# Patient Record
Sex: Female | Born: 1935 | Race: White | Hispanic: No | Smoking: Never smoker
Health system: Southern US, Community
[De-identification: ages and names within clinical notes are randomized; demographics above are authoritative.]

## PROBLEM LIST (undated history)

## (undated) DIAGNOSIS — Z8781 Personal history of (healed) traumatic fracture: Secondary | ICD-10-CM

## (undated) DIAGNOSIS — F329 Major depressive disorder, single episode, unspecified: Secondary | ICD-10-CM

## (undated) DIAGNOSIS — S7292XA Unspecified fracture of left femur, initial encounter for closed fracture: Secondary | ICD-10-CM

## (undated) DIAGNOSIS — G20C Parkinsonism, unspecified: Secondary | ICD-10-CM

## (undated) DIAGNOSIS — F419 Anxiety disorder, unspecified: Secondary | ICD-10-CM

## (undated) DIAGNOSIS — H3530 Unspecified macular degeneration: Secondary | ICD-10-CM

## (undated) DIAGNOSIS — Q600 Renal agenesis, unilateral: Secondary | ICD-10-CM

## (undated) DIAGNOSIS — IMO0002 Reserved for concepts with insufficient information to code with codable children: Secondary | ICD-10-CM

## (undated) DIAGNOSIS — R413 Other amnesia: Secondary | ICD-10-CM

## (undated) DIAGNOSIS — I10 Essential (primary) hypertension: Secondary | ICD-10-CM

## (undated) DIAGNOSIS — G4700 Insomnia, unspecified: Secondary | ICD-10-CM

## (undated) DIAGNOSIS — F32A Depression, unspecified: Secondary | ICD-10-CM

## (undated) DIAGNOSIS — F0390 Unspecified dementia without behavioral disturbance: Secondary | ICD-10-CM

## (undated) HISTORY — PX: RECTOCELE REPAIR: SHX761

## (undated) HISTORY — PX: CATARACT EXTRACTION, BILATERAL: SHX1313

## (undated) HISTORY — DX: Other amnesia: R41.3

## (undated) HISTORY — PX: ABDOMINAL HYSTERECTOMY: SHX81

## (undated) HISTORY — DX: Unspecified fracture of left femur, initial encounter for closed fracture: S72.92XA

## (undated) HISTORY — PX: BREAST SURGERY: SHX581

## (undated) HISTORY — PX: BLADDER SUSPENSION: SHX72

## (undated) HISTORY — PX: NASAL SEPTUM SURGERY: SHX37

## (undated) HISTORY — PX: EYE SURGERY: SHX253

## (undated) HISTORY — PX: GALLBLADDER SURGERY: SHX652

## (undated) HISTORY — PX: RECONSTRUCTION OF EYELID: SHX6576

## (undated) HISTORY — PX: JOINT REPLACEMENT: SHX530

---

## 2016-07-17 ENCOUNTER — Encounter (HOSPITAL_COMMUNITY)
Admission: EM | Disposition: A | Discharge status: Skilled Nursing Facility | Payer: Self-pay | Source: Home / Self Care | Attending: Internal Medicine

## 2016-07-17 ENCOUNTER — Emergency Department (HOSPITAL_COMMUNITY): Payer: Medicare Other

## 2016-07-17 ENCOUNTER — Encounter (HOSPITAL_COMMUNITY): Payer: Self-pay | Admitting: Emergency Medicine

## 2016-07-17 ENCOUNTER — Inpatient Hospital Stay (HOSPITAL_COMMUNITY)
Admission: EM | Admit: 2016-07-17 | Discharge: 2016-07-21 | DRG: 470 | Disposition: A | Discharge status: Skilled Nursing Facility | Payer: Medicare Other | Attending: Internal Medicine | Admitting: Internal Medicine

## 2016-07-17 ENCOUNTER — Observation Stay (HOSPITAL_COMMUNITY): Payer: Medicare Other | Admitting: Anesthesiology

## 2016-07-17 ENCOUNTER — Inpatient Hospital Stay (HOSPITAL_COMMUNITY): Payer: Medicare Other

## 2016-07-17 ENCOUNTER — Observation Stay (HOSPITAL_COMMUNITY): Payer: Medicare Other

## 2016-07-17 DIAGNOSIS — Y9301 Activity, walking, marching and hiking: Secondary | ICD-10-CM | POA: Diagnosis present

## 2016-07-17 DIAGNOSIS — H3530 Unspecified macular degeneration: Secondary | ICD-10-CM | POA: Diagnosis present

## 2016-07-17 DIAGNOSIS — F419 Anxiety disorder, unspecified: Secondary | ICD-10-CM | POA: Diagnosis present

## 2016-07-17 DIAGNOSIS — Z419 Encounter for procedure for purposes other than remedying health state, unspecified: Secondary | ICD-10-CM

## 2016-07-17 DIAGNOSIS — W109XXA Fall (on) (from) unspecified stairs and steps, initial encounter: Secondary | ICD-10-CM | POA: Diagnosis present

## 2016-07-17 DIAGNOSIS — D696 Thrombocytopenia, unspecified: Secondary | ICD-10-CM | POA: Diagnosis present

## 2016-07-17 DIAGNOSIS — G4700 Insomnia, unspecified: Secondary | ICD-10-CM | POA: Diagnosis present

## 2016-07-17 DIAGNOSIS — F32A Depression, unspecified: Secondary | ICD-10-CM | POA: Diagnosis present

## 2016-07-17 DIAGNOSIS — Z885 Allergy status to narcotic agent status: Secondary | ICD-10-CM

## 2016-07-17 DIAGNOSIS — F418 Other specified anxiety disorders: Secondary | ICD-10-CM | POA: Diagnosis not present

## 2016-07-17 DIAGNOSIS — D62 Acute posthemorrhagic anemia: Secondary | ICD-10-CM | POA: Diagnosis not present

## 2016-07-17 DIAGNOSIS — Z79899 Other long term (current) drug therapy: Secondary | ICD-10-CM | POA: Diagnosis not present

## 2016-07-17 DIAGNOSIS — R443 Hallucinations, unspecified: Secondary | ICD-10-CM | POA: Diagnosis present

## 2016-07-17 DIAGNOSIS — S72002A Fracture of unspecified part of neck of left femur, initial encounter for closed fracture: Secondary | ICD-10-CM | POA: Diagnosis not present

## 2016-07-17 DIAGNOSIS — S7292XA Unspecified fracture of left femur, initial encounter for closed fracture: Secondary | ICD-10-CM | POA: Diagnosis not present

## 2016-07-17 DIAGNOSIS — I10 Essential (primary) hypertension: Secondary | ICD-10-CM | POA: Diagnosis present

## 2016-07-17 DIAGNOSIS — F329 Major depressive disorder, single episode, unspecified: Secondary | ICD-10-CM | POA: Diagnosis present

## 2016-07-17 DIAGNOSIS — Z96649 Presence of unspecified artificial hip joint: Secondary | ICD-10-CM

## 2016-07-17 HISTORY — DX: Reserved for concepts with insufficient information to code with codable children: IMO0002

## 2016-07-17 HISTORY — DX: Essential (primary) hypertension: I10

## 2016-07-17 HISTORY — DX: Renal agenesis, unilateral: Q60.0

## 2016-07-17 HISTORY — DX: Depression, unspecified: F32.A

## 2016-07-17 HISTORY — DX: Major depressive disorder, single episode, unspecified: F32.9

## 2016-07-17 HISTORY — DX: Insomnia, unspecified: G47.00

## 2016-07-17 HISTORY — PX: TOTAL HIP ARTHROPLASTY: SHX124

## 2016-07-17 HISTORY — DX: Anxiety disorder, unspecified: F41.9

## 2016-07-17 HISTORY — DX: Personal history of (healed) traumatic fracture: Z87.81

## 2016-07-17 HISTORY — DX: Unspecified macular degeneration: H35.30

## 2016-07-17 LAB — TYPE AND SCREEN
ABO/RH(D): B NEG
ABO/RH(D): B NEG
Antibody Screen: NEGATIVE
Antibody Screen: NEGATIVE

## 2016-07-17 LAB — BASIC METABOLIC PANEL
Anion gap: 8 (ref 5–15)
BUN: 25 mg/dL — AB (ref 6–20)
CHLORIDE: 107 mmol/L (ref 101–111)
CO2: 24 mmol/L (ref 22–32)
Calcium: 9 mg/dL (ref 8.9–10.3)
Creatinine, Ser: 0.75 mg/dL (ref 0.44–1.00)
GFR calc Af Amer: >60 mL/min (ref >60)
GFR calc non Af Amer: >60 mL/min (ref >60)
GLUCOSE: 127 mg/dL — AB (ref 65–99)
POTASSIUM: 4.2 mmol/L (ref 3.5–5.1)
SODIUM: 139 mmol/L (ref 135–145)

## 2016-07-17 LAB — CBC WITH DIFFERENTIAL/PLATELET
Basophils Absolute: 0 10*3/uL (ref 0.0–0.1)
Basophils Relative: 0 %
EOS PCT: 0 %
Eosinophils Absolute: 0.1 10*3/uL (ref 0.0–0.7)
HCT: 41.3 % (ref 36.0–46.0)
HEMOGLOBIN: 13.9 g/dL (ref 12.0–15.0)
LYMPHS ABS: 0.9 10*3/uL (ref 0.7–4.0)
LYMPHS PCT: 8 %
MCH: 28.8 pg (ref 26.0–34.0)
MCHC: 33.7 g/dL (ref 30.0–36.0)
MCV: 85.7 fL (ref 78.0–100.0)
MONOS PCT: 6 %
Monocytes Absolute: 0.8 10*3/uL (ref 0.1–1.0)
Neutro Abs: 10.3 10*3/uL — ABNORMAL HIGH (ref 1.7–7.7)
Neutrophils Relative %: 86 %
PLATELETS: 145 10*3/uL — AB (ref 150–400)
RBC: 4.82 MIL/uL (ref 3.87–5.11)
RDW: 13.6 % (ref 11.5–15.5)
WBC: 12.1 10*3/uL — AB (ref 4.0–10.5)

## 2016-07-17 LAB — CBC
HCT: 36.6 % (ref 36.0–46.0)
Hemoglobin: 11.7 g/dL — ABNORMAL LOW (ref 12.0–15.0)
MCH: 28.5 pg (ref 26.0–34.0)
MCHC: 32 g/dL (ref 30.0–36.0)
MCV: 89.1 fL (ref 78.0–100.0)
PLATELETS: 130 10*3/uL — AB (ref 150–400)
RBC: 4.11 MIL/uL (ref 3.87–5.11)
RDW: 13.8 % (ref 11.5–15.5)
WBC: 9.6 10*3/uL (ref 4.0–10.5)

## 2016-07-17 LAB — SURGICAL PCR SCREEN
MRSA, PCR: NEGATIVE
STAPHYLOCOCCUS AUREUS: NEGATIVE

## 2016-07-17 LAB — CREATININE, SERUM
Creatinine, Ser: 0.78 mg/dL (ref 0.44–1.00)
GFR calc Af Amer: >60 mL/min (ref >60)
GFR calc non Af Amer: >60 mL/min (ref >60)

## 2016-07-17 LAB — ABO/RH
ABO/RH(D): B NEG
ABO/RH(D): B NEG

## 2016-07-17 LAB — PROTIME-INR
INR: 0.98
Prothrombin Time: 13 seconds (ref 11.4–15.2)

## 2016-07-17 SURGERY — ARTHROPLASTY, HIP, TOTAL, ANTERIOR APPROACH
Anesthesia: Spinal | Laterality: Left

## 2016-07-17 MED ORDER — HYDROMORPHONE HCL 1 MG/ML IJ SOLN
1.0000 mg | Freq: Once | INTRAMUSCULAR | Status: AC
  Administered 2016-07-17: 1 mg via INTRAVENOUS

## 2016-07-17 MED ORDER — DEXTROSE 5 % IV SOLN
INTRAVENOUS | Status: DC | PRN
  Administered 2016-07-17: 15:00:00 via INTRAVENOUS

## 2016-07-17 MED ORDER — CEFAZOLIN SODIUM-DEXTROSE 2-4 GM/100ML-% IV SOLN
2.0000 g | INTRAVENOUS | Status: AC
  Administered 2016-07-17: 2 g via INTRAVENOUS

## 2016-07-17 MED ORDER — NALOXONE HCL 0.4 MG/ML IJ SOLN: 0.4000 mg | INTRAMUSCULAR | Status: DC | PRN

## 2016-07-17 MED ORDER — BUPIVACAINE LIPOSOME 1.3 % IJ SUSP
20.0000 mL | INTRAMUSCULAR | Status: AC
  Filled 2016-07-17: qty 20

## 2016-07-17 MED ORDER — ONDANSETRON HCL 4 MG PO TABS: 4.0000 mg | ORAL_TABLET | Freq: Four times a day (QID) | ORAL | Status: DC | PRN

## 2016-07-17 MED ORDER — ONDANSETRON HCL 4 MG/2ML IJ SOLN
INTRAMUSCULAR | Status: DC | PRN
  Administered 2016-07-17: 4 mg via INTRAVENOUS

## 2016-07-17 MED ORDER — SODIUM CHLORIDE 0.9 % IV SOLN
INTRAVENOUS | Status: DC
Start: 2016-07-17 — End: 2016-07-21
  Administered 2016-07-17 – 2016-07-18 (×2): via INTRAVENOUS

## 2016-07-17 MED ORDER — HYDROMORPHONE 1 MG/ML IV SOLN
INTRAVENOUS | Status: DC
  Administered 2016-07-17: 05:00:00 via INTRAVENOUS
  Administered 2016-07-17: 2.6 mg via INTRAVENOUS
  Filled 2016-07-17: qty 25

## 2016-07-17 MED ORDER — ONDANSETRON HCL 4 MG/2ML IJ SOLN: 4.0000 mg | Freq: Four times a day (QID) | INTRAMUSCULAR | Status: DC | PRN

## 2016-07-17 MED ORDER — MORPHINE SULFATE (PF) 2 MG/ML IV SOLN: 0.5000 mg | INTRAVENOUS | Status: DC | PRN

## 2016-07-17 MED ORDER — MIRTAZAPINE 15 MG PO TABS
7.5000 mg | ORAL_TABLET | Freq: Every day | ORAL | Status: DC
  Administered 2016-07-17 – 2016-07-20 (×4): 7.5 mg via ORAL
  Filled 2016-07-17 (×4): qty 1

## 2016-07-17 MED ORDER — DIPHENHYDRAMINE HCL 50 MG/ML IJ SOLN: 12.5000 mg | Freq: Four times a day (QID) | INTRAMUSCULAR | Status: DC | PRN

## 2016-07-17 MED ORDER — ROCURONIUM BROMIDE 100 MG/10ML IV SOLN
INTRAVENOUS | Status: DC | PRN
  Administered 2016-07-17: 30 mg via INTRAVENOUS

## 2016-07-17 MED ORDER — LACTATED RINGERS IV SOLN
INTRAVENOUS | Status: DC
  Administered 2016-07-17: 15:00:00 via INTRAVENOUS

## 2016-07-17 MED ORDER — METHOCARBAMOL 1000 MG/10ML IJ SOLN
500.0000 mg | Freq: Four times a day (QID) | INTRAVENOUS | Status: DC | PRN
  Filled 2016-07-17: qty 5

## 2016-07-17 MED ORDER — AMLODIPINE BESYLATE 10 MG PO TABS
10.0000 mg | ORAL_TABLET | Freq: Every day | ORAL | Status: DC
  Administered 2016-07-20 – 2016-07-21 (×2): 10 mg via ORAL
  Filled 2016-07-17 (×4): qty 1

## 2016-07-17 MED ORDER — PHENOL 1.4 % MT LIQD: 1.0000 | OROMUCOSAL | Status: DC | PRN

## 2016-07-17 MED ORDER — CEFAZOLIN SODIUM-DEXTROSE 2-4 GM/100ML-% IV SOLN
INTRAVENOUS | Status: AC
  Filled 2016-07-17: qty 100

## 2016-07-17 MED ORDER — CEFAZOLIN SODIUM-DEXTROSE 2-4 GM/100ML-% IV SOLN
2.0000 g | Freq: Four times a day (QID) | INTRAVENOUS | Status: AC
  Administered 2016-07-17 – 2016-07-18 (×3): 2 g via INTRAVENOUS
  Filled 2016-07-17 (×3): qty 100

## 2016-07-17 MED ORDER — HYDROCODONE-ACETAMINOPHEN 5-325 MG PO TABS
1.0000 | ORAL_TABLET | Freq: Four times a day (QID) | ORAL | Status: DC | PRN
  Administered 2016-07-18: 2 via ORAL
  Administered 2016-07-18: 1 via ORAL
  Filled 2016-07-17: qty 2
  Filled 2016-07-17: qty 1

## 2016-07-17 MED ORDER — LACTATED RINGERS IV SOLN
INTRAVENOUS | Status: DC
  Administered 2016-07-17: 05:00:00 via INTRAVENOUS

## 2016-07-17 MED ORDER — TRANEXAMIC ACID 1000 MG/10ML IV SOLN
1000.0000 mg | INTRAVENOUS | Status: AC
  Administered 2016-07-17: 1000 mg via INTRAVENOUS
  Filled 2016-07-17: qty 10

## 2016-07-17 MED ORDER — SERTRALINE HCL 25 MG PO TABS
25.0000 mg | ORAL_TABLET | Freq: Every day | ORAL | Status: DC
  Administered 2016-07-18 – 2016-07-21 (×4): 25 mg via ORAL
  Filled 2016-07-17 (×4): qty 1

## 2016-07-17 MED ORDER — TRANEXAMIC ACID 1000 MG/10ML IV SOLN
INTRAVENOUS | Status: DC | PRN
  Administered 2016-07-17: 2000 mg via TOPICAL

## 2016-07-17 MED ORDER — METOCLOPRAMIDE HCL 5 MG/ML IJ SOLN: 5.0000 mg | Freq: Three times a day (TID) | INTRAMUSCULAR | Status: DC | PRN

## 2016-07-17 MED ORDER — BUSPIRONE HCL 5 MG PO TABS
5.0000 mg | ORAL_TABLET | Freq: Every day | ORAL | Status: DC
  Administered 2016-07-17 – 2016-07-20 (×4): 5 mg via ORAL
  Filled 2016-07-17 (×4): qty 1

## 2016-07-17 MED ORDER — SODIUM CHLORIDE 0.9% FLUSH: 9.0000 mL | INTRAVENOUS | Status: DC | PRN

## 2016-07-17 MED ORDER — PHENYLEPHRINE HCL 10 MG/ML IJ SOLN
INTRAMUSCULAR | Status: DC | PRN
  Administered 2016-07-17 (×3): 80 ug via INTRAVENOUS
  Administered 2016-07-17: 40 ug via INTRAVENOUS

## 2016-07-17 MED ORDER — PROMETHAZINE HCL 25 MG/ML IJ SOLN: 6.2500 mg | INTRAMUSCULAR | Status: DC | PRN

## 2016-07-17 MED ORDER — DIPHENHYDRAMINE HCL 12.5 MG/5ML PO ELIX: 12.5000 mg | ORAL_SOLUTION | Freq: Four times a day (QID) | ORAL | Status: DC | PRN

## 2016-07-17 MED ORDER — SUCCINYLCHOLINE CHLORIDE 200 MG/10ML IV SOSY
PREFILLED_SYRINGE | INTRAVENOUS | Status: AC
  Filled 2016-07-17: qty 10

## 2016-07-17 MED ORDER — OXYCODONE HCL 5 MG PO TABS: 5.0000 mg | ORAL_TABLET | ORAL | 0 refills | Status: DC | PRN

## 2016-07-17 MED ORDER — EPHEDRINE SULFATE 50 MG/ML IJ SOLN
INTRAMUSCULAR | Status: DC | PRN
  Administered 2016-07-17: 10 mg via INTRAVENOUS
  Administered 2016-07-17: 5 mg via INTRAVENOUS

## 2016-07-17 MED ORDER — ACETAMINOPHEN 650 MG RE SUPP: 650.0000 mg | Freq: Four times a day (QID) | RECTAL | Status: DC | PRN

## 2016-07-17 MED ORDER — METHOCARBAMOL 500 MG PO TABS
500.0000 mg | ORAL_TABLET | Freq: Four times a day (QID) | ORAL | Status: DC | PRN
  Administered 2016-07-18 – 2016-07-21 (×6): 500 mg via ORAL
  Filled 2016-07-17 (×6): qty 1

## 2016-07-17 MED ORDER — FENTANYL CITRATE (PF) 100 MCG/2ML IJ SOLN
INTRAMUSCULAR | Status: DC | PRN
  Administered 2016-07-17 (×2): 100 ug via INTRAVENOUS

## 2016-07-17 MED ORDER — ARTIFICIAL TEARS OP OINT
TOPICAL_OINTMENT | OPHTHALMIC | Status: AC
  Filled 2016-07-17: qty 3.5

## 2016-07-17 MED ORDER — ROCURONIUM BROMIDE 50 MG/5ML IV SOLN
INTRAVENOUS | Status: AC
  Filled 2016-07-17: qty 1

## 2016-07-17 MED ORDER — HYDROMORPHONE HCL 1 MG/ML IJ SOLN
0.5000 mg | Freq: Once | INTRAMUSCULAR | Status: AC
Start: 2016-07-17 — End: 2016-07-17
  Administered 2016-07-17: 0.5 mg via INTRAVENOUS
  Filled 2016-07-17: qty 1

## 2016-07-17 MED ORDER — ARTIFICIAL TEARS OP OINT
TOPICAL_OINTMENT | OPHTHALMIC | Status: DC | PRN
  Administered 2016-07-17: 1 via OPHTHALMIC

## 2016-07-17 MED ORDER — SODIUM CHLORIDE 0.9 % IR SOLN
Status: DC | PRN
  Administered 2016-07-17: 3000 mL
  Administered 2016-07-17: 1000 mL

## 2016-07-17 MED ORDER — ONDANSETRON HCL 4 MG/2ML IJ SOLN
INTRAMUSCULAR | Status: AC
  Filled 2016-07-17: qty 2

## 2016-07-17 MED ORDER — METOCLOPRAMIDE HCL 5 MG PO TABS: 5.0000 mg | ORAL_TABLET | Freq: Three times a day (TID) | ORAL | Status: DC | PRN

## 2016-07-17 MED ORDER — HYDROMORPHONE HCL 1 MG/ML IJ SOLN
0.5000 mg | INTRAMUSCULAR | Status: DC | PRN
  Administered 2016-07-17 – 2016-07-20 (×4): 0.5 mg via INTRAVENOUS
  Filled 2016-07-17 (×4): qty 1

## 2016-07-17 MED ORDER — ENOXAPARIN SODIUM 40 MG/0.4ML ~~LOC~~ SOLN: 40.0000 mg | Freq: Every day | SUBCUTANEOUS | 0 refills | Status: DC

## 2016-07-17 MED ORDER — ALPRAZOLAM 0.25 MG PO TABS
0.2500 mg | ORAL_TABLET | Freq: Two times a day (BID) | ORAL | Status: DC | PRN
  Administered 2016-07-20: 0.25 mg via ORAL
  Filled 2016-07-17: qty 1

## 2016-07-17 MED ORDER — PHENYLEPHRINE 40 MCG/ML (10ML) SYRINGE FOR IV PUSH (FOR BLOOD PRESSURE SUPPORT)
PREFILLED_SYRINGE | INTRAVENOUS | Status: AC
  Filled 2016-07-17: qty 10

## 2016-07-17 MED ORDER — HYDROMORPHONE HCL 1 MG/ML IJ SOLN
INTRAMUSCULAR | Status: AC
  Administered 2016-07-17: 0.25 mg via INTRAVENOUS
  Filled 2016-07-17: qty 1

## 2016-07-17 MED ORDER — SUCCINYLCHOLINE CHLORIDE 20 MG/ML IJ SOLN
INTRAMUSCULAR | Status: DC | PRN
  Administered 2016-07-17: 40 mg via INTRAVENOUS

## 2016-07-17 MED ORDER — ONDANSETRON HCL 4 MG/2ML IJ SOLN
4.0000 mg | Freq: Four times a day (QID) | INTRAMUSCULAR | Status: DC | PRN
Start: 2016-07-17 — End: 2016-07-17

## 2016-07-17 MED ORDER — TRANEXAMIC ACID 1000 MG/10ML IV SOLN
2000.0000 mg | INTRAVENOUS | Status: AC
  Filled 2016-07-17: qty 20

## 2016-07-17 MED ORDER — MENTHOL 3 MG MT LOZG: 1.0000 | LOZENGE | OROMUCOSAL | Status: DC | PRN

## 2016-07-17 MED ORDER — ATENOLOL 50 MG PO TABS
50.0000 mg | ORAL_TABLET | Freq: Every day | ORAL | Status: DC
  Administered 2016-07-17 – 2016-07-21 (×3): 50 mg via ORAL
  Filled 2016-07-17: qty 2
  Filled 2016-07-17 (×4): qty 1

## 2016-07-17 MED ORDER — HYDROCODONE-ACETAMINOPHEN 7.5-325 MG PO TABS: 1.0000 | ORAL_TABLET | Freq: Once | ORAL | Status: DC | PRN

## 2016-07-17 MED ORDER — SODIUM CHLORIDE 0.9% FLUSH: INTRAVENOUS | Status: DC | PRN

## 2016-07-17 MED ORDER — LACTATED RINGERS IV SOLN
INTRAVENOUS | Status: DC | PRN
  Administered 2016-07-17 (×2): via INTRAVENOUS

## 2016-07-17 MED ORDER — FENTANYL CITRATE (PF) 250 MCG/5ML IJ SOLN
INTRAMUSCULAR | Status: AC
  Filled 2016-07-17: qty 5

## 2016-07-17 MED ORDER — PROSIGHT PO TABS
1.0000 | ORAL_TABLET | Freq: Every day | ORAL | Status: DC
  Administered 2016-07-18 – 2016-07-21 (×4): 1 via ORAL
  Filled 2016-07-17 (×5): qty 1

## 2016-07-17 MED ORDER — LIDOCAINE 2% (20 MG/ML) 5 ML SYRINGE
INTRAMUSCULAR | Status: AC
  Filled 2016-07-17: qty 5

## 2016-07-17 MED ORDER — HYDROMORPHONE HCL 1 MG/ML IJ SOLN
1.0000 mg | Freq: Once | INTRAMUSCULAR | Status: DC
  Filled 2016-07-17: qty 1

## 2016-07-17 MED ORDER — SUGAMMADEX SODIUM 200 MG/2ML IV SOLN
INTRAVENOUS | Status: AC
  Filled 2016-07-17: qty 2

## 2016-07-17 MED ORDER — MIDAZOLAM HCL 2 MG/2ML IJ SOLN
INTRAMUSCULAR | Status: AC
  Filled 2016-07-17: qty 2

## 2016-07-17 MED ORDER — 0.9 % SODIUM CHLORIDE (POUR BTL) OPTIME
TOPICAL | Status: DC | PRN
  Administered 2016-07-17: 1000 mL

## 2016-07-17 MED ORDER — PROPOFOL 10 MG/ML IV BOLUS
INTRAVENOUS | Status: DC | PRN
  Administered 2016-07-17: 100 mg via INTRAVENOUS

## 2016-07-17 MED ORDER — ENOXAPARIN SODIUM 40 MG/0.4ML ~~LOC~~ SOLN
40.0000 mg | SUBCUTANEOUS | Status: DC
Start: 2016-07-18 — End: 2016-07-19
  Administered 2016-07-18 – 2016-07-19 (×2): 40 mg via SUBCUTANEOUS
  Filled 2016-07-17 (×2): qty 0.4

## 2016-07-17 MED ORDER — HYDROMORPHONE HCL 1 MG/ML IJ SOLN
0.2500 mg | INTRAMUSCULAR | Status: DC | PRN
  Administered 2016-07-17 (×2): 0.25 mg via INTRAVENOUS

## 2016-07-17 MED ORDER — ACETAMINOPHEN 325 MG PO TABS: 650.0000 mg | ORAL_TABLET | Freq: Four times a day (QID) | ORAL | Status: DC | PRN

## 2016-07-17 MED ORDER — ALUM & MAG HYDROXIDE-SIMETH 200-200-20 MG/5ML PO SUSP: 30.0000 mL | ORAL | Status: DC | PRN

## 2016-07-17 MED ORDER — LIDOCAINE HCL (CARDIAC) 20 MG/ML IV SOLN
INTRAVENOUS | Status: DC | PRN
  Administered 2016-07-17: 100 mg via INTRATRACHEAL

## 2016-07-17 SURGICAL SUPPLY — 52 items
BAG DECANTER FOR FLEXI CONT (MISCELLANEOUS) ×2 IMPLANT
BIT DRILL 25 QKSET FLEX 1PC (BIT) ×2 IMPLANT
CAPT HIP TOTAL 2 ×2 IMPLANT
CELLS DAT CNTRL 66122 CELL SVR (MISCELLANEOUS) ×1 IMPLANT
COVER SURGICAL LIGHT HANDLE (MISCELLANEOUS) ×2 IMPLANT
DRAPE C-ARM 42X72 X-RAY (DRAPES) ×2 IMPLANT
DRAPE STERI IOBAN 125X83 (DRAPES) ×2 IMPLANT
DRAPE U-SHAPE 47X51 STRL (DRAPES) ×6 IMPLANT
DRSG AQUACEL AG ADV 3.5X10 (GAUZE/BANDAGES/DRESSINGS) ×2 IMPLANT
DRSG MEPILEX BORDER 4X8 (GAUZE/BANDAGES/DRESSINGS) ×2 IMPLANT
DURAPREP 26ML APPLICATOR (WOUND CARE) ×2 IMPLANT
ELECT BLADE 4.0 EZ CLEAN MEGAD (MISCELLANEOUS) ×2
ELECT REM PT RETURN 9FT ADLT (ELECTROSURGICAL) ×2
ELECTRODE BLDE 4.0 EZ CLN MEGD (MISCELLANEOUS) ×1 IMPLANT
ELECTRODE REM PT RTRN 9FT ADLT (ELECTROSURGICAL) ×1 IMPLANT
GLOVE SKINSENSE NS SZ7.5 (GLOVE) ×1
GLOVE SKINSENSE STRL SZ7.5 (GLOVE) ×1 IMPLANT
GLOVE SURG SYN 7.5  E (GLOVE) ×2
GLOVE SURG SYN 7.5 E (GLOVE) ×2 IMPLANT
GOWN SRG XL XLNG 56XLVL 4 (GOWN DISPOSABLE) ×1 IMPLANT
GOWN STRL NON-REIN XL XLG LVL4 (GOWN DISPOSABLE) ×1
GOWN STRL REUS W/ TWL LRG LVL3 (GOWN DISPOSABLE) IMPLANT
GOWN STRL REUS W/TWL LRG LVL3 (GOWN DISPOSABLE)
HANDPIECE INTERPULSE COAX TIP (DISPOSABLE) ×1
HOOD PEEL AWAY FLYTE STAYCOOL (MISCELLANEOUS) ×4 IMPLANT
IV NS 1000ML (IV SOLUTION) ×1
IV NS 1000ML BAXH (IV SOLUTION) ×1 IMPLANT
IV NS IRRIG 3000ML ARTHROMATIC (IV SOLUTION) ×2 IMPLANT
KIT BASIN OR (CUSTOM PROCEDURE TRAY) ×2 IMPLANT
MARKER SKIN DUAL TIP RULER LAB (MISCELLANEOUS) ×2 IMPLANT
NEEDLE SPNL 18GX3.5 QUINCKE PK (NEEDLE) ×2 IMPLANT
PACK TOTAL JOINT (CUSTOM PROCEDURE TRAY) ×2 IMPLANT
PACK UNIVERSAL I (CUSTOM PROCEDURE TRAY) ×2 IMPLANT
RTRCTR WOUND ALEXIS 18CM MED (MISCELLANEOUS) ×2
SAW OSC TIP CART 19.5X105X1.3 (SAW) ×2 IMPLANT
SEALER BIPOLAR AQUA 6.0 (INSTRUMENTS) ×2 IMPLANT
SET HNDPC FAN SPRY TIP SCT (DISPOSABLE) ×1 IMPLANT
STAPLER VISISTAT (STAPLE) ×2 IMPLANT
STAPLER VISISTAT 35W (STAPLE) IMPLANT
SUT ETHIBOND 2 V 37 (SUTURE) ×2 IMPLANT
SUT ETHIBOND NAB CT1 #1 30IN (SUTURE) ×6 IMPLANT
SUT VIC AB 0 CT1 27 (SUTURE) ×2
SUT VIC AB 0 CT1 27XBRD ANBCTR (SUTURE) ×2 IMPLANT
SUT VIC AB 1 CT1 27 (SUTURE) ×1
SUT VIC AB 1 CT1 27XBRD ANBCTR (SUTURE) ×1 IMPLANT
SUT VIC AB 2-0 CT1 27 (SUTURE) ×1
SUT VIC AB 2-0 CT1 TAPERPNT 27 (SUTURE) ×1 IMPLANT
SYR 20CC LL (SYRINGE) ×2 IMPLANT
SYR 50ML LL SCALE MARK (SYRINGE) ×2 IMPLANT
TOWEL OR 17X26 10 PK STRL BLUE (TOWEL DISPOSABLE) ×2 IMPLANT
TRAY CATH 16FR W/PLASTIC CATH (SET/KITS/TRAYS/PACK) IMPLANT
YANKAUER SUCT BULB TIP NO VENT (SUCTIONS) ×2 IMPLANT

## 2016-07-17 NOTE — Progress Notes (Signed)
CRNA and OR nurse aware that due to pain, unable to place sacral foam prophylaxis preoperatively. Dressing placed in chart, to be placed intraop.

## 2016-07-17 NOTE — Transfer of Care (Signed)
Immediate Anesthesia Transfer of Care Note  Patient: Emily Escobar  Procedure(s) Performed: Procedure(s): TOTAL HIP ARTHROPLASTY ANTERIOR APPROACH (Left)  Patient Location: PACU  Anesthesia Type:General  Level of Consciousness: awake and patient cooperative  Airway & Oxygen Therapy: Patient Spontanous Breathing and Patient connected to nasal cannula oxygen  Post-op Assessment: Report given to RN, Post -op Vital signs reviewed and stable and Patient moving all extremities  Post vital signs: Reviewed and stable  Last Vitals:  Vitals:   07/17/16 0930 07/17/16 1158  BP: (!) 127/57 128/68  Pulse: 77 68  Resp: 16 15  Temp: 36.8 C 36.9 C    Last Pain:  Vitals:   07/17/16 1158  TempSrc: Oral  PainSc:       Patients Stated Pain Goal: 3 (07/17/16 0806)  Complications: No apparent anesthesia complications

## 2016-07-17 NOTE — ED Notes (Signed)
EKG given to EDP, Horton.MD., for review.  

## 2016-07-17 NOTE — Consult Note (Signed)
 ORTHOPAEDIC CONSULTATION  REQUESTING PHYSICIAN: Iskra M Myers, MD  Chief Complaint: Left femoral neck hip fracture  HPI: Emily Escobar is a 80 y.o. female who presents with left hip fracture s/p mechanical fall PTA.  The patient endorses severe pain in the left hip, that does not radiate, grinding in quality, worse with any movement, better with immobilization.  Denies LOC/fever/chills/nausea/vomiting.  Walks without assistive devices (walker, cane, wheelchair).  Does live independently with daughter in [place].  Denies LOC, neck pain, abd pain.  Past Medical History:  Diagnosis Date  . Anxiety and depression   . History of vertebral fracture    from fall in September 2016  . Hypertension   . Insomnia   . Macular degeneration   . Solitary kidney    Past Surgical History:  Procedure Laterality Date  . ABDOMINAL HYSTERECTOMY    . BLADDER SUSPENSION    . BREAST SURGERY    . CATARACT EXTRACTION, BILATERAL    . EYE SURGERY    . GALLBLADDER SURGERY    . JOINT REPLACEMENT    . NASAL SEPTUM SURGERY    . RECONSTRUCTION OF EYELID    . RECTOCELE REPAIR     Social History   Social History  . Marital status: Widowed    Spouse name: N/A  . Number of children: N/A  . Years of education: N/A   Occupational History  . retired    Social History Main Topics  . Smoking status: Never Smoker  . Smokeless tobacco: Never Used  . Alcohol use 1.8 - 2.4 oz/week    3 - 4 Glasses of wine per week  . Drug use: No  . Sexual activity: Not Asked   Other Topics Concern  . None   Social History Narrative  . None   History reviewed. No pertinent family history. Allergies  Allergen Reactions  . Codeine   . Fentanyl Other (See Comments)    Pt reports having hallucinations.  . Morphine And Related    Prior to Admission medications   Medication Sig Start Date End Date Taking? Authorizing Provider  ALPRAZolam (XANAX) 0.25 MG tablet Take 0.25 mg by mouth 2 (two) times daily as needed for  anxiety.   Yes Historical Provider, MD  amLODipine (NORVASC) 10 MG tablet Take 1 tablet by mouth daily. 06/25/16  Yes Historical Provider, MD  atenolol (TENORMIN) 50 MG tablet Take 1 tablet by mouth daily. 07/02/16  Yes Historical Provider, MD  beta carotene w/minerals (OCUVITE) tablet Take 1 tablet by mouth daily.   Yes Historical Provider, MD  busPIRone (BUSPAR) 5 MG tablet Take 1 tablet by mouth at bedtime. 06/21/16  Yes Historical Provider, MD  cholecalciferol (VITAMIN D) 1000 units tablet Take 5,000 Units by mouth daily.   Yes Historical Provider, MD  fexofenadine (ALLEGRA) 180 MG tablet Take 180 mg by mouth as needed for allergies or rhinitis.   Yes Historical Provider, MD  hydrOXYzine (VISTARIL) 50 MG capsule Take 1 capsule by mouth at bedtime as needed (sleep).  06/10/16  Yes Historical Provider, MD  mirtazapine (REMERON) 15 MG tablet Take 7.5 mg by mouth at bedtime. 05/29/16  Yes Historical Provider, MD  Multiple Vitamin (MULTIVITAMIN WITH MINERALS) TABS tablet Take 1 tablet by mouth daily.   Yes Historical Provider, MD  sertraline (ZOLOFT) 25 MG tablet Take 1 tablet by mouth daily. 07/03/16  Yes Historical Provider, MD   Chest Portable 1 View  Result Date: 07/17/2016 CLINICAL DATA:  Preoperative exam prior to hip fracture repair   EXAM: PORTABLE CHEST 1 VIEW COMPARISON:  None in PACs FINDINGS: The lungs are adequately inflated. There is no focal infiltrate. The cardiac silhouette is mildly enlarged. The pulmonary vascularity is normal. There is calcification in the wall of the aortic arch. There is no pleural effusion or pneumothorax. 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 IMPRESSION: Mild cardiomegaly. No significant pulmonary vascular congestion or pulmonary edema. No pneumonia. Electronically Signed   By: David  Jordan M.D.   On: 07/17/2016 07:48   Dg Hip Unilat With Pelvis 2-3 Views Left  Result Date: 07/17/2016 CLINICAL DATA:  Missed a step, fell at son's home.  LEFT hip pain. EXAM: DG HIP  (WITH OR WITHOUT PELVIS) 2-3V LEFT COMPARISON:  None. FINDINGS: Acute LEFT femoral neck fracture with impaction, slight lateral angulation of the distal bony fragments. No dislocation. No destructive bony lesions. Phleboliths project in the pelvis. Surgical sutures in the abdomen. IMPRESSION: Acute mildly displaced LEFT femoral neck fracture without dislocation. Electronically Signed   By: Courtnay  Bloomer M.D.   On: 07/17/2016 00:49    All pertinent xrays, MRI, CT independently reviewed and interpreted  Positive ROS: All other systems have been reviewed and were otherwise negative with the exception of those mentioned in the HPI and as above.  Physical Exam: General: Alert, no acute distress Cardiovascular: No pedal edema Respiratory: No cyanosis, no use of accessory musculature GI: No organomegaly, abdomen is soft and non-tender Skin: No lesions in the area of chief complaint Neurologic: Sensation intact distally Psychiatric: Patient is competent for consent with normal mood and affect Lymphatic: No axillary or cervical lymphadenopathy  MUSCULOSKELETAL:  - pain with movement of the hip and extremity - skin intact - NVI distally - compartments soft  Assessment: Left femoral neck hip fracture  Plan: - total hip replacement is recommended, patient and family are aware of r/b/a and wish to proceed - consent obtained - medical optimization per primary team - surgery is planned for today - Based on history and fracture pattern this likely represents a fragility fracture. - Fragility fractures affect up to one half of women and one third of men after age 50 years and occur in the setting of bone disorder such as osteoporosis or osteopenia and warrant appropriate work-up. - The following are general recommendations that may serve as an outline for an appropriate work-up:  1.) Obtain bone density measurement to confirm presumptive diagnosis, assess severity of osteoporosis and risk of  future fracture, and use as baseline for monitoring treatment  2.) Obtain laboratory tests: CBC, ESR, serum calcium, creatinine, albumin,phosphate, alkaline phosphatase, liver transaminases, protein electrophoresis, urinalysis, 25-hydroxyvitamin D.  3.) Exclude secondary causes of low bone mass and skeletal fragility (eg,multiple myeloma, lymphoma) as indicated.  4.) Obtain radiograph of thoracic and lumbar spine, particularly among individuals with back pain or height loss to assess presence of vertebral fractures  5.) Intermittent administration of recombinant human parathyroid hormone  6.) Optimize nutritional status using nutritional supplementation.  7.) Patient/family education to prevent future falls.  8.) Early mobilization and exercise program - exercise decreases the rate of bone loss and has been associated with decreased rate of fragility fractures   Thank you for the consult and the opportunity to see Ms. Orner  N. Michael , MD [facility] [phone] 3:13 PM      

## 2016-07-17 NOTE — ED Provider Notes (Signed)
[facility]-EMERGENCY DEPT Provider Note   CSN: [id number] Arrival date & time: 07/17/16  0003  First Provider Contact:   First MD Initiated Contact with Patient 07/17/16 0030     By signing my name below, I, Octavia Heir, attest that this documentation has been prepared under the direction and in the presence of Shon Baton, MD.  Electronically Signed: Octavia Heir, ED Scribe. 07/17/16. 1:00 AM.    History   Chief Complaint Chief Complaint  Patient presents with  . Fall   The history is provided by the patient and the EMS personnel. No language interpreter was used.   HPI Comments: Emily Escobar is a 80 y.o. female brought in by ambulance, who has a PMHx of HTN presents to the Emergency Department presenting after a fall that occurred this evening. Pt reports she was walking down the steps at her sons house when she missed the last step and injured her left side. She complains of left hip pain that radiates into her left groin and into her leg. Pt did not hit her head or lose consciousness.  She has not taken any medication to alleviate her pain. She denies numbness, tingling, abdominal pain, or shortness of breath.  Past Medical History:  Diagnosis Date  . Hypertension     There are no active problems to display for this patient.   Past Surgical History:  Procedure Laterality Date  . ABDOMINAL HYSTERECTOMY    . BLADDER SUSPENSION    . BREAST SURGERY    . CATARACT EXTRACTION, BILATERAL    . EYE SURGERY    . GALLBLADDER SURGERY    . JOINT REPLACEMENT    . NASAL SEPTUM SURGERY    . RECONSTRUCTION OF EYELID    . RECTOCELE REPAIR      OB History    No data available       Home Medications    Prior to Admission medications   Medication Sig Start Date End Date Taking? Authorizing Provider  ALPRAZolam (XANAX) 0.25 MG tablet Take 0.25 mg by mouth 2 (two) times daily as needed for anxiety.   Yes Historical Provider, MD  amLODipine (NORVASC) 10 MG tablet Take 1 tablet  by mouth daily. 06/25/16  Yes Historical Provider, MD  atenolol (TENORMIN) 50 MG tablet Take 1 tablet by mouth daily. 07/02/16  Yes Historical Provider, MD  beta carotene w/minerals (OCUVITE) tablet Take 1 tablet by mouth daily.   Yes Historical Provider, MD  busPIRone (BUSPAR) 5 MG tablet Take 1 tablet by mouth at bedtime. 06/21/16  Yes Historical Provider, MD  cholecalciferol (VITAMIN D) 1000 units tablet Take 5,000 Units by mouth daily.   Yes Historical Provider, MD  fexofenadine (ALLEGRA) 180 MG tablet Take 180 mg by mouth as needed for allergies or rhinitis.   Yes Historical Provider, MD  hydrOXYzine (VISTARIL) 50 MG capsule Take 1 capsule by mouth at bedtime as needed (sleep).  06/10/16  Yes Historical Provider, MD  mirtazapine (REMERON) 15 MG tablet Take 7.5 mg by mouth at bedtime. 05/29/16  Yes Historical Provider, MD  Multiple Vitamin (MULTIVITAMIN WITH MINERALS) TABS tablet Take 1 tablet by mouth daily.   Yes Historical Provider, MD  sertraline (ZOLOFT) 25 MG tablet Take 1 tablet by mouth daily. 07/03/16  Yes Historical Provider, MD    Family History No family history on file.  Social History Social History  Substance Use Topics  . Smoking status: Not on file  . Smokeless tobacco: Not on file  . Alcohol use Not  on file     Allergies   Codeine; Fentanyl; and Morphine and related   Review of Systems Review of Systems  Respiratory: Negative for shortness of breath.   Gastrointestinal: Negative for abdominal pain.  Musculoskeletal: Positive for arthralgias. Negative for back pain and neck pain.  Neurological: Negative for numbness.  All other systems reviewed and are negative.    Physical Exam Updated Vital Signs BP 121/63   Pulse 65   Temp 97.8 F (36.6 C) (Oral)   Resp 14   SpO2 94%   Physical Exam  Constitutional: She is oriented to person, place, and time. She appears well-developed and well-nourished. No distress.  HENT:  Head: Normocephalic and atraumatic.    Cardiovascular: Normal rate, regular rhythm and normal heart sounds.   No murmur heard. Pulmonary/Chest: Effort normal and breath sounds normal. No respiratory distress. She has no wheezes.  Abdominal: Soft. Bowel sounds are normal. There is no tenderness. There is no guarding.  Musculoskeletal: She exhibits no edema.  Limited range of motion of left hip secondary to pain, no foreshortening noted, no obvious deformity but tenderness palpation of the left lateral hip, 2+ DP pulse  Neurological: She is alert and oriented to person, place, and time.  Skin: Skin is warm and dry.  Psychiatric: She has a normal mood and affect.  Nursing note and vitals reviewed.    ED Treatments / Results  DIAGNOSTIC STUDIES: Oxygen Saturation is 97% on RA, normal by my interpretation.  COORDINATION OF CARE:  12:57 AM Discussed treatment plan which includes pain medication with pt at bedside and pt agreed to plan.  Labs (all labs ordered are listed, but only abnormal results are displayed) Labs Reviewed  BASIC METABOLIC PANEL - Abnormal; Notable for the following:       Result Value   Glucose, Bld 127 (*)    BUN 25 (*)    All other components within normal limits  CBC WITH DIFFERENTIAL/PLATELET - Abnormal; Notable for the following:    WBC 12.1 (*)    Platelets 145 (*)    Neutro Abs 10.3 (*)    All other components within normal limits  PROTIME-INR  TYPE AND SCREEN  ABO/RH    EKG  EKG Interpretation  Date/Time:  Thursday July 17 2016 01:36:36 EDT Ventricular Rate:  92 PR Interval:    QRS Duration: 90 QT Interval:  409 QTC Calculation: 506 R Axis:   62 Text Interpretation:  Sinus rhythm Anterior infarct, old Prolonged QT interval No prior for comparison Confirmed by Wilkie Aye  MD, Toni Amend ([id number]) on 07/17/2016 2:45:34 AM       Radiology Dg Hip Unilat With Pelvis 2-3 Views Left  Result Date: 07/17/2016 CLINICAL DATA:  Missed a step, fell at son's home.  LEFT hip pain. EXAM: DG HIP (WITH  OR WITHOUT PELVIS) 2-3V LEFT COMPARISON:  None. FINDINGS: Acute LEFT femoral neck fracture with impaction, slight lateral angulation of the distal bony fragments. No dislocation. No destructive bony lesions. Phleboliths project in the pelvis. Surgical sutures in the abdomen. IMPRESSION: Acute mildly displaced LEFT femoral neck fracture without dislocation. Electronically Signed   By: Awilda Metro M.D.   On: 07/17/2016 00:49    Procedures Procedures (including critical care time)  Medications Ordered in ED Medications  HYDROmorphone (DILAUDID) injection 0.5 mg (0.5 mg Intravenous Given 07/17/16 0125)  HYDROmorphone (DILAUDID) injection 1 mg (1 mg Intravenous Given 07/17/16 0154)     Initial Impression / Assessment and Plan / ED Course  I have  reviewed the triage vital signs and the nursing notes.  Pertinent labs & imaging results that were available during my care of the patient were reviewed by me and considered in my medical decision making (see chart for details).  Clinical Course  Comment By Time  Discussed w/ orthopedics on call, Dr. August Saucer.  Will admit to medicine.  Patient and family updated at [facility]. Shon Baton, MD 08/03 [phone]    Patient presents with left hip pain after fall. She is otherwise nontoxic. Patient was given pain medication. X-rays notable for a left femoral neck fracture. Discussed with orthopedics. Hypertension is her only known medical problem. She is not on blood thinners. Screening lab work sent. Will admit to the hospitalist. Anticipate surgery later today.  Final Clinical Impressions(s) / ED Diagnoses   Final diagnoses:  Closed left hip fracture, initial encounter [facility])   I personally performed the services described in this documentation, which was scribed in my presence. The recorded information has been reviewed and is accurate.  New Prescriptions New Prescriptions   No medications on file     Shon Baton, MD 07/17/16 [phone]

## 2016-07-17 NOTE — Anesthesia Preprocedure Evaluation (Signed)
Anesthesia Evaluation  Patient identified by MRN, date of birth, ID band Patient awake    Reviewed: Allergy & Precautions, H&P , NPO status , Patient's Chart, lab work & pertinent test results  History of Anesthesia Complications Negative for: history of anesthetic complications  Airway Mallampati: II  TM Distance: >3 FB Neck ROM: full    Dental no notable dental hx.    Pulmonary neg pulmonary ROS,    Pulmonary exam normal breath sounds clear to auscultation       Cardiovascular hypertension, Normal cardiovascular exam Rhythm:regular Rate:Normal     Neuro/Psych PSYCHIATRIC DISORDERS negative neurological ROS     GI/Hepatic negative GI ROS, Neg liver ROS,   Endo/Other  negative endocrine ROS  Renal/GU negative Renal ROS     Musculoskeletal   Abdominal   Peds  Hematology negative hematology ROS (+)   Anesthesia Other Findings   Reproductive/Obstetrics negative OB ROS                             Anesthesia Physical Anesthesia Plan  ASA: II  Anesthesia Plan: Spinal   Post-op Pain Management:    Induction: Intravenous  Airway Management Planned: Natural Airway  Additional Equipment:   Intra-op Plan:   Post-operative Plan:   Informed Consent: I have reviewed the patients History and Physical, chart, labs and discussed the procedure including the risks, benefits and alternatives for the proposed anesthesia with the patient or authorized representative who has indicated his/her understanding and acceptance.   Dental Advisory Given  Plan Discussed with: Anesthesiologist, CRNA and Surgeon  Anesthesia Plan Comments:         Anesthesia Quick Evaluation

## 2016-07-17 NOTE — H&P (Signed)
History and Physical    Emily Escobar ZOX:[id number] DOB: 1936-05-05 DOA: 07/17/2016  PCP: Norval Gable - [place], [place] Consultants:  [facility] Ahmed - psychiatry; neurology - Dr. Mickie Kay; Lonn Georgia - dermatology; Matt Holmes - ophthalmology Patient coming from: son's house (visiting); lives with daughter in [place]  Chief Complaint: fall  HPI: Emily Escobar is a 80 y.o. female with medical history significant of HTN and frequent falls presenting with hip fracture following fall tonight.  Patient was supposed to be flying back to [place] but flight was delayed and so she returned back to son's house.  She went upstairs and missed a step and fell.  She landed on hardwood floor on left hip.    Also fell last September.  Had severe hallucinations after being given Fentanyl and psych was called in.  Had vertebral fractures from fall in September.  Concussion from fall in March and tailbone contusions.  ED Course: Dilaudid 0.5 mg and 1 mg; xray positive for fracture; ortho Dr. August Saucer consulted and will see patient in AM  Review of Systems: As per HPI; otherwise 10 point review of systems reviewed and negative.   Ambulatory Status:  Ambulatory prior to fall  Past Medical History:  Diagnosis Date  . Anxiety and depression   . History of vertebral fracture    from fall in September 2016  . Hypertension   . Insomnia   . Macular degeneration   . Solitary kidney     Past Surgical History:  Procedure Laterality Date  . ABDOMINAL HYSTERECTOMY    . BLADDER SUSPENSION    . BREAST SURGERY    . CATARACT EXTRACTION, BILATERAL    . EYE SURGERY    . GALLBLADDER SURGERY    . JOINT REPLACEMENT    . NASAL SEPTUM SURGERY    . RECONSTRUCTION OF EYELID    . RECTOCELE REPAIR      Social History   Social History  . Marital status: Widowed    Spouse name: N/A  . Number of children: N/A  . Years of education: N/A   Occupational History  . retired    Social History Main Topics  . Smoking status: Never Smoker    . Smokeless tobacco: Never Used  . Alcohol use 1.8 - 2.4 oz/week    3 - 4 Glasses of wine per week  . Drug use: No  . Sexual activity: Not on file   Other Topics Concern  . Not on file   Social History Narrative  . No narrative on file    Allergies  Allergen Reactions  . Codeine   . Fentanyl Other (See Comments)    Pt reports having hallucinations.  . Morphine And Related     History reviewed. No pertinent family history.  Prior to Admission medications   Medication Sig Start Date End Date Taking? Authorizing Provider  ALPRAZolam (XANAX) 0.25 MG tablet Take 0.25 mg by mouth 2 (two) times daily as needed for anxiety.   Yes Historical Provider, MD  amLODipine (NORVASC) 10 MG tablet Take 1 tablet by mouth daily. 06/25/16  Yes Historical Provider, MD  atenolol (TENORMIN) 50 MG tablet Take 1 tablet by mouth daily. 07/02/16  Yes Historical Provider, MD  beta carotene w/minerals (OCUVITE) tablet Take 1 tablet by mouth daily.   Yes Historical Provider, MD  busPIRone (BUSPAR) 5 MG tablet Take 1 tablet by mouth at bedtime. 06/21/16  Yes Historical Provider, MD  cholecalciferol (VITAMIN D) 1000 units tablet Take 5,000 Units by mouth daily.  Yes Historical Provider, MD  fexofenadine (ALLEGRA) 180 MG tablet Take 180 mg by mouth as needed for allergies or rhinitis.   Yes Historical Provider, MD  hydrOXYzine (VISTARIL) 50 MG capsule Take 1 capsule by mouth at bedtime as needed (sleep).  06/10/16  Yes Historical Provider, MD  mirtazapine (REMERON) 15 MG tablet Take 7.5 mg by mouth at bedtime. 05/29/16  Yes Historical Provider, MD  Multiple Vitamin (MULTIVITAMIN WITH MINERALS) TABS tablet Take 1 tablet by mouth daily.   Yes Historical Provider, MD  sertraline (ZOLOFT) 25 MG tablet Take 1 tablet by mouth daily. 07/03/16  Yes Historical Provider, MD    Physical Exam: Vitals:   07/17/16 0005 07/17/16 0200 07/17/16 0300 07/17/16 0335  BP: 147/87 121/63 130/68 (!) 147/67  Pulse: 97 65 73 77  Resp:  Temp: 97.8 F (36.6 C)   98.7 F (37.1 C)  TempSrc: Oral   Oral  SpO2: 97% 94% 92% 95%  Height:     (1.575 m)     General:  Appears calm and comfortable and is NAD Eyes:  EOMI, normal lids, iris ENT:  grossly normal hearing, lips & tongue, mmm Neck:  no LAD, masses or thyromegaly Cardiovascular:  RRR, no m/r/g. No LE edema.  Respiratory:  CTA bilaterally, no w/r/r. Normal respiratory effort. Abdomen:  soft, ntnd, NABS Skin:  no rash or induration seen on limited exam Musculoskeletal:  grossly normal tone BUE/BLE, good ROM, no bony abnormality Psychiatric:  grossly normal mood and affect, speech fluent and appropriate, AOx3 Neurologic:  CN 2-12 grossly intact, moves all extremities in coordinated fashion, sensation intact  Labs on Admission: I have personally reviewed following labs and imaging studies  CBC:  Recent Labs Lab 07/17/16 0200  WBC 12.1*  NEUTROABS 10.3*  HGB 13.9  HCT 41.3  MCV 85.7  PLT 145*   Basic Metabolic Panel:  Recent Labs Lab 07/17/16 0200  NA 139  K 4.2  CL 107  CO2 24  GLUCOSE 127*  BUN 25*  CREATININE 0.75  CALCIUM 9.0   GFR: CrCl cannot be calculated (Unknown ideal weight.). Liver Function Tests: No results for input(s): AST, ALT, ALKPHOS, BILITOT, PROT, ALBUMIN in the last 168 hours. No results for input(s): LIPASE, AMYLASE in the last 168 hours. No results for input(s): AMMONIA in the last 168 hours. Coagulation Profile:  Recent Labs Lab 07/17/16 0200  INR 0.98   Cardiac Enzymes: No results for input(s): CKTOTAL, CKMB, CKMBINDEX, TROPONINI in the last 168 hours. BNP (last 3 results) No results for input(s): PROBNP in the last 8760 hours. HbA1C: No results for input(s): HGBA1C in the last 72 hours. CBG: No results for input(s): GLUCAP in the last 168 hours. Lipid Profile: No results for input(s): CHOL, HDL, LDLCALC, TRIG, CHOLHDL, LDLDIRECT in the last 72 hours. Thyroid Function Tests: No results for  input(s): TSH, T4TOTAL, FREET4, T3FREE, THYROIDAB in the last 72 hours. Anemia Panel: No results for input(s): VITAMINB12, FOLATE, FERRITIN, TIBC, IRON, RETICCTPCT in the last 72 hours. Urine analysis: No results found for: COLORURINE, APPEARANCEUR, LABSPEC, PHURINE, GLUCOSEU, HGBUR, BILIRUBINUR, KETONESUR, PROTEINUR, UROBILINOGEN, NITRITE, LEUKOCYTESUR  Creatinine Clearance: CrCl cannot be calculated (Unknown ideal weight.).  Sepsis Labs: (procalcitonin:4,lacticidven:4) )No results found for this or any previous visit (from the past 240 hour(s)).   Radiological Exams on Admission: Dg Hip Unilat With Pelvis 2-3 Views Left  Result Date: 07/17/2016 CLINICAL DATA:  Missed a step, fell at son's home.  LEFT hip pain. EXAM: DG  HIP (WITH OR WITHOUT PELVIS) 2-3V LEFT COMPARISON:  None. FINDINGS: Acute LEFT femoral neck fracture with impaction, slight lateral angulation of the distal bony fragments. No dislocation. No destructive bony lesions. Phleboliths project in the pelvis. Surgical sutures in the abdomen. IMPRESSION: Acute mildly displaced LEFT femoral neck fracture without dislocation. Electronically Signed   By: Awilda Metro M.D.   On: 07/17/2016 00:49    EKG: Independently reviewed.  NSR with rate 92; prolonged QT interval; no evidence of acute ischemia  Assessment/Plan Principal Problem:   Closed left femoral fracture, initial encounter Active Problems:   Essential hypertension   Anxiety and depression   Femoral neck fracture, left -Mildly displaced fracture, will need ORIF later today, ortho aware and will consult on patient -Will place in observation status, med surg -Dilaudid Half-Dose PCA for pain control until able to have surgical repair (has drug allergies and did not react badly to Dilaudid in ER but having recurrent and persistent uncontrolled pain) -SW consult as patient is likely to need inpatient rehab -Will place foley -NPO in anticipation of surgery later  today  HTN -Continue home meds - Norvasc, Atenolol  Depression/anxiety -Has been weaning down doses -Currently taking Xanax, Buspar, Zoloft, Vistail, Remeron -Will continue all psych meds for now  DVT prophylaxis: Lovenox after surgery, SCDs for now Code Status:  Full - confirmed with patient/family Family Communication: Son at bedside throughout evaluation Disposition Plan: Likely to need inpatient rehab Consults called: Orthopedics (by ER) Admission status: Observation, Med Surg   Jonah Blue MD [facility]  If 7PM-7AM, please contact night-coverage [email or web address] Password [email or web address]  07/17/2016, 4:02 AM

## 2016-07-17 NOTE — ED Triage Notes (Signed)
Per EMS, pt was at son's home and missed the last step when descending. No LOC and no dizziness reported. EMS reports pt has no complaints of neck or back pain. Pt reported that she did not hit her head. Pt complains of left hip pain. EMS reported no shortening of rotation of left leg. Pt reports being allergic to fentanyl and morphine.

## 2016-07-17 NOTE — Progress Notes (Signed)
Pt seen and examined at bedside, admitted after midnight. Please see earlier admission note by Dr. Ophelia Charter.   In summary: Patient is 80 year old female who [facility] after an episode of presumptive mechanical fall and has sustained left femoral neck fracture. Orthopedic surgery has been notified and recommended transfer to [facility] for further evaluation and management. Discussed with Dr. Roda Shutters. Patient was stable for transfer. Keep nothing by mouth for now.  Debbora Presto, MD  [facility] Pager [phone]  If 7PM-7AM, please contact night-coverage [email or web address] Password [email or web address]

## 2016-07-17 NOTE — ED Notes (Signed)
Will give report to nurse on the floor. 

## 2016-07-17 NOTE — Op Note (Signed)
TOTAL HIP ARTHROPLASTY ANTERIOR APPROACH  Procedure Note Emily Escobar   [id number]  Pre-op Diagnosis: Left hip fracture     Post-op Diagnosis: same   Operative Procedures  1. Total hip replacement; Left hip; uncemented [id number]   Personnel  Surgeon(s): Tarry Kos, MD   Anesthesia: general  Prosthesis: Depuy Acetabulum: Pinnacle 52 mm Femur: Corail KA 10 Head: 36 mm size: +1.5 Liner: neutral Bearing Type: Ceramic on poly  Date of Service: 07/17/2016  Total Hip Arthroplasty (Anterior Approach) Op Note:  After informed consent was obtained and the operative extremity marked in the holding area, the patient was brought back to the operating room and placed supine on the HANA table. Next, the operative extremity was prepped and draped in normal sterile fashion. Surgical timeout occurred verifying patient identification, surgical site, surgical procedure and administration of antibiotics.  A modified anterior Smith-Peterson approach to the hip was performed, using the interval between tensor fascia lata and sartorius.  Dissection was carried bluntly down onto the anterior hip capsule. The lateral femoral circumflex vessels were identified and coagulated. A capsulotomy was performed and the capsular flaps tagged for later repair.  Fluoroscopy was utilized to prepare for the femoral neck cut. The neck osteotomy was performed. The femoral head was removed, the acetabular rim was cleared of soft tissue and attention was turned to reaming the acetabulum.  Sequential reaming was performed under fluoroscopic guidance. We reamed to a size 51 mm, and then impacted the acetabular shell. The liner was then placed after irrigation and attention turned to the femur.  After placing the femoral hook, the leg was taken to externally rotated, extended and adducted position taking care to perform soft tissue releases to allow for adequate mobilization of the femur. Soft tissue was cleared from the shoulder  of the greater trochanter and the hook elevator used to improve exposure of the proximal femur. Sequential broaching performed up to a size 10. Trial neck and head were placed. The leg was brought back up to neutral and the construct reduced. The position and sizing of components, offset and leg lengths were checked using fluoroscopy. Stability of the  construct was checked in extension and external rotation without any subluxation or impingement of prosthesis. We dislocated the prosthesis, dropped the leg back into position, removed trial components, and irrigated copiously. The final stem and head was then placed, the leg brought back up, the system reduced and fluoroscopy used to verify positioning.  We irrigated, obtained hemostasis and closed the capsule using #2 ethibond suture.  Dilute betadyne solution was used. The fascia was closed with #1 vicryl plus, the deep fat layer was closed with 0 vicryl, the subcutaneous layers closed with 2.0 Vicryl Plus and the skin closed with staples. A sterile dressing was applied. The patient was awakened in the operating room and taken to recovery in stable condition.  All sponge, needle, and instrument counts were correct at the end of the case.   Position: supine  Complications: none.  Time Out: performed   Drains/Packing: none  Estimated blood loss: 400 cc  Returned to Recovery Room: in good condition.   Antibiotics: yes   Mechanical VTE (DVT) Prophylaxis: sequential compression devices, TED thigh-high  Chemical VTE (DVT) Prophylaxis: lovenox  Fluid Replacement: see anesthesia record  Specimens Removed: 1 to pathology   Sponge and Instrument Count Correct? yes   PACU: portable radiograph - low AP   Admission: inpatient status, start PT & OT POD#1  Plan/RTC: Return in 2  weeks for staple removal. Return in 6 weeks to see MD.  Weight Bearing/Load Lower Extremity: full  Hip precautions: none Suture Removal: 10-14 days  Betadine to incision  twice daily once dressing is removed on POD#7  N. Glee Arvin, MD [facility] [phone] 5:24 PM      Implant Name Type Inv. Item Serial No. Manufacturer Lot No. LRB No. Used  PIN SECTOR W/GRIP ACE CUP - [id number] Hips PIN SECTOR W/GRIP ACE CUP  DEPUY [id number] Left 1  LINER ACETAB NEUTRAL 36ID 520D - [id number] Liner LINER ACETAB NEUTRAL 36ID 520D  DEPUY [id number] Left 1  SCREW 6.5MMX25MM - [id number] Screw SCREW 6.5MMX25MM  DEPUY [id number] Left 1  STEM CORAIL KA10 - [id number] Stem STEM CORAIL KA10  DEPUY [id number] Left 1  HEAD CERAMIC DELTA 36 PLUS 1.5 - [id number] Hips HEAD CERAMIC DELTA 36 PLUS 1.5   DEPUY [id number] Left 1

## 2016-07-17 NOTE — OR Nursing (Signed)
Discussed pain management with Selena Batten RN on 5N for Ms. Pillay.  Kim stated the Dilaudid PCA was to be discontinued due to pt was too sleepy to use it.  Pt was slow to wake from anesthesia and required little narotic in PACU.  We mutually agreed not to start the PCA until the patient needed it.

## 2016-07-17 NOTE — ED Notes (Signed)
Bed: WA09 Expected date:  Expected time:  Means of arrival:  Comments: fall 

## 2016-07-17 NOTE — Anesthesia Procedure Notes (Signed)
Procedure Name: Intubation Date/Time: 07/17/2016 3:37 PM Performed by: Wray Kearns A Pre-anesthesia Checklist: Patient identified, Emergency Drugs available, Suction available and Patient being monitored Patient Re-evaluated:Patient Re-evaluated prior to inductionOxygen Delivery Method: Circle System Utilized Preoxygenation: Pre-oxygenation with 100% oxygen Intubation Type: IV induction and Cricoid Pressure applied Ventilation: Mask ventilation without difficulty Laryngoscope Size: Mac and 4 Grade View: Grade I Tube type: Oral Tube size: 7.5 mm Number of attempts: 1 Airway Equipment and Method: Stylet and Oral airway Placement Confirmation: ETT inserted through vocal cords under direct vision,  positive ETCO2 and breath sounds checked- equal and bilateral Secured at: 22 cm Tube secured with: Tape Dental Injury: Teeth and Oropharynx as per pre-operative assessment

## 2016-07-17 NOTE — Discharge Instructions (Signed)
° ° °  1. Change dressings as needed °2. May shower but keep incisions covered and dry °3. Take lovenox to prevent blood clots °4. Take stool softeners as needed °5. Take pain meds as needed ° °

## 2016-07-17 NOTE — Progress Notes (Signed)
Nash Dimmer, RN at [facility] to provide update on patient and plan of care as far surgery. Pt is scheduled at 1500 today. [facility] has been notified to come pick patient up and will transfer patient as soon as they are available. All questions answered for nurse at [facility].

## 2016-07-17 NOTE — Progress Notes (Signed)
Called gave report to surgery. Was asked to D/C hydromorphone 19 ml, Korie Hudson witness waste in sink.  Called pharmacy to find out if there was any other protocol l needed for this waste since it was not in the medication cart due to the pt coming from [facility]. CHG bath was given, Consents signed and explained to family that they could be with the family up until surgery.

## 2016-07-17 NOTE — Anesthesia Postprocedure Evaluation (Signed)
Anesthesia Post Note  Patient: Emily Escobar  Procedure(s) Performed: Procedure(s) (LRB): TOTAL HIP ARTHROPLASTY ANTERIOR APPROACH (Left)  Patient location during evaluation: PACU Anesthesia Type: General Level of consciousness: awake and alert and patient cooperative Pain management: pain level controlled Vital Signs Assessment: post-procedure vital signs reviewed and stable Respiratory status: spontaneous breathing and respiratory function stable Cardiovascular status: stable Anesthetic complications: no    Last Vitals:  Vitals:   07/17/16 1800 07/17/16 1811  BP:  127/62  Pulse: 71 72  Resp: 11 10  Temp:      Last Pain:  Vitals:   07/17/16 1158  TempSrc: Oral  PainSc:                  Mikias Lanz S

## 2016-07-17 NOTE — Progress Notes (Signed)
Spoke with Dr. Roda Shutters regarding post op main management. D/C PCA orders and change Morphine PRN to Dilaudid 0.5mg -1mg  Q2 prn

## 2016-07-18 ENCOUNTER — Encounter (HOSPITAL_COMMUNITY): Payer: Self-pay | Admitting: Orthopaedic Surgery

## 2016-07-18 DIAGNOSIS — F418 Other specified anxiety disorders: Secondary | ICD-10-CM

## 2016-07-18 DIAGNOSIS — I10 Essential (primary) hypertension: Secondary | ICD-10-CM

## 2016-07-18 LAB — BASIC METABOLIC PANEL
Anion gap: 5 (ref 5–15)
BUN: 14 mg/dL (ref 6–20)
CHLORIDE: 104 mmol/L (ref 101–111)
CO2: 28 mmol/L (ref 22–32)
CREATININE: 0.82 mg/dL (ref 0.44–1.00)
Calcium: 8.2 mg/dL — ABNORMAL LOW (ref 8.9–10.3)
GFR calc Af Amer: >60 mL/min (ref >60)
Glucose, Bld: 129 mg/dL — ABNORMAL HIGH (ref 65–99)
POTASSIUM: 4.2 mmol/L (ref 3.5–5.1)
Sodium: 137 mmol/L (ref 135–145)

## 2016-07-18 LAB — CBC
HEMATOCRIT: 33.5 % — AB (ref 36.0–46.0)
HEMOGLOBIN: 10.9 g/dL — AB (ref 12.0–15.0)
MCH: 29 pg (ref 26.0–34.0)
MCHC: 32.5 g/dL (ref 30.0–36.0)
MCV: 89.1 fL (ref 78.0–100.0)
Platelets: 105 10*3/uL — ABNORMAL LOW (ref 150–400)
RBC: 3.76 MIL/uL — AB (ref 3.87–5.11)
RDW: 13.7 % (ref 11.5–15.5)
WBC: 7.1 10*3/uL (ref 4.0–10.5)

## 2016-07-18 MED ORDER — HYDROCODONE-ACETAMINOPHEN 5-325 MG PO TABS
1.0000 | ORAL_TABLET | ORAL | Status: DC | PRN
  Administered 2016-07-18 – 2016-07-21 (×14): 2 via ORAL
  Filled 2016-07-18 (×15): qty 2

## 2016-07-18 NOTE — Evaluation (Signed)
Physical Therapy Evaluation Patient Details Name: Emily Escobar MRN: [id number] DOB: Apr 17, 1936 Today's Date: 07/18/2016   History of Present Illness  80 y.o. female admitted with Lt hip fracture (fall) and now s/p direct anterior THA. PMH: history of falls, macular degeneration, hypertension, vertebral fx.   Clinical Impression  Pt is s/p Lt direct anterior THA following a fall and resulting hip fracture. Pt mobilizing slowly during initial PT session. Recommending SNF for further rehabilitation following acute stay (pt and family in agreement).  Pt will benefit from skilled PT to increase their independence and safety.      Follow Up Recommendations SNF;Supervision for mobility/OOB    Equipment Recommendations  Other (comment) (to be assessed at next venue)    Recommendations for Other Services       Precautions / Restrictions Precautions Precautions: Fall Restrictions Weight Bearing Restrictions: Yes LLE Weight Bearing: Weight bearing as tolerated      Mobility  Bed Mobility Overal bed mobility: Needs Assistance Bed Mobility: Supine to Sit     Supine to sit: Mod assist     General bed mobility comments: verbal cues needed for sequence and hand placement. Physical assist provided at LLE and trunk to full achieve sitting position.   Transfers Overall transfer level: Needs assistance Equipment used: Rolling walker (2 wheeled) Transfers: Sit to/from [employer] Sit to Stand: Mod assist Stand pivot transfers: Mod assist (bed-chair-BSC-chair)       General transfer comment: Pt expressing pain with sit/stand transfers.   Ambulation/Gait Ambulation/Gait assistance: Min assist Ambulation Distance (Feet): 2 Feet Assistive device: Rolling walker (2 wheeled) Gait Pattern/deviations: Step-to pattern Gait velocity: very slow pattern   General Gait Details: Pt needing assistance with advancing LLE. Pt taking steps from [facility] to chair.   Stairs             Wheelchair Mobility    Modified Rankin (Stroke Patients Only)       Balance Overall balance assessment: Needs assistance Sitting-balance support: Single extremity supported Sitting balance-Leahy Scale: Poor     Standing balance support: Bilateral upper extremity supported Standing balance-Leahy Scale: Poor Standing balance comment: using rw for support                             Pertinent Vitals/Pain Pain Assessment: 0-10 Pain Score: 8  Pain Location: Lt hip Pain Descriptors / Indicators: Aching (piercing) Pain Intervention(s): Limited activity within patient's tolerance;Monitored during session    Home Living Family/patient expects to be discharged to:: Skilled nursing facility Living Arrangements: Alone               Additional Comments: Pt lives with daughter in [place], was visiting her daughter when she fell.     Prior Function Level of Independence: Independent               Hand Dominance        Extremity/Trunk Assessment   Upper Extremity Assessment: Defer to OT evaluation           Lower Extremity Assessment: LLE deficits/detail   LLE Deficits / Details: Needing assist with moving LE, pt reports due to pain.      Communication      Cognition Arousal/Alertness: Awake/alert Behavior During Therapy: WFL for tasks assessed/performed Overall Cognitive Status: Within Functional Limits for tasks assessed                      General Comments  Exercises        Assessment/Plan    PT Assessment Patient needs continued PT services  PT Diagnosis Difficulty walking   PT Problem List Decreased strength;Decreased range of motion;Decreased activity tolerance;Decreased balance;Decreased mobility  PT Treatment Interventions DME instruction;Gait training;Stair training;Functional mobility training;Therapeutic activities;Therapeutic exercise;Patient/family education   PT Goals (Current goals can be found in the  Care Plan section) Acute Rehab PT Goals Patient Stated Goal: eventually get back to [place] PT Goal Formulation: With patient Time For Goal Achievement: 08/01/16 Potential to Achieve Goals: Good    Frequency Min 3X/week   Barriers to discharge        Co-evaluation               End of Session Equipment Utilized During Treatment: Gait belt Activity Tolerance: Patient limited by pain Patient left: in chair;with call bell/phone within reach;with family/visitor present Nurse Communication: Mobility status;Weight bearing status         Time: 5409-8119 PT Time Calculation (min) (ACUTE ONLY): 39 min   Charges:   PT Evaluation $PT Eval Moderate Complexity: 1 Procedure PT Treatments $Therapeutic Activity: 23-37 mins   PT G Codes:        Christiane Ha, PT, CSCS Pager [phone] Office [phone]  07/18/2016, 1:41 PM

## 2016-07-18 NOTE — Progress Notes (Signed)
SW completed FL2, and applied for PASRR.   Crista Curb, Child psychotherapist

## 2016-07-18 NOTE — Progress Notes (Signed)
PROGRESS NOTE    Emily Escobar  ZOX:[id number] DOB: [birth year] DOA: 07/17/2016 PCP: No PCP Per Patient   Brief Narrative:  Emily Escobar is a 80 y.o. female with medical history significant of HTN and frequent falls presenting with hip fracture following a  Mechanical fall.   Assessment & Plan:   Principal Problem:   Closed left femoral fracture, initial encounter Active Problems:   Essential hypertension   Anxiety and depression   Left femoral fracture; underwent left hp replacement. Pain control. Appreciate orthopedics recommendations.  PT eval, recommending SNF.    Hypertension: well controlled.   Anemia: probably blood loss anemia.  Monitor.   Depression and anxiety:  Resume home meds.   DVT prophylaxis: (Lovenox) Code Status: (Full) Family Communication: daughter at bedside.  Disposition Plan: SNF when bed available.    Consultants:   Orthopedics.    Procedures:  Total hip replacement on the left on 8/3   Antimicrobials: none   Subjective: Reports pain not adequately controlled.   Objective: Vitals:   07/18/16 0215 07/18/16 0507 07/18/16 1203 07/18/16 1248  BP: (!) 114/53 (!) 122/55 (!) 102/48 (!) 123/49  Pulse: 74 77 (!) 58 68  Resp: Temp: 99.5 F (37.5 C) 99.3 F (37.4 C)  98.1 F (36.7 C)  TempSrc: Oral Oral  Oral  SpO2: 97% 96%  99%  Weight:      Height:        Intake/Output Summary (Last 24 hours) at 07/18/16 1851 Last data filed at 07/18/16 1832  Gross per 24 hour  Intake             2235 ml  Output              850 ml  Net             1385 ml   Filed Weights   07/17/16 0400  Weight: 68 kg (150 lb)    Examination:  General exam: Appears calm and comfortable  Respiratory system: Clear to auscultation. Respiratory effort normal. Cardiovascular system: S1 & S2 heard, RRR. No JVD, murmurs, rubs, gallops or clicks. No pedal edema. Gastrointestinal system: Abdomen is nondistended, soft and nontender. No organomegaly or  masses felt. Normal bowel sounds heard. Central nervous system: Alert and oriented. No focal neurological deficits. Extremities: was not able to bear weight on left leg.  Skin: No rashes, lesions or ulcers Psychiatry: Judgement and insight appear normal. Mood & affect appropriate.     Data Reviewed: I have personally reviewed following labs and imaging studies  CBC:  Recent Labs Lab 07/17/16 0200 07/17/16 2058 07/18/16 0302  WBC 12.1* 9.6 7.1  NEUTROABS 10.3*  --   --   HGB 13.9 11.7* 10.9*  HCT 41.3 36.6 33.5*  MCV 85.7 89.1 89.1  PLT 145* 130* 105*   Basic Metabolic Panel:  Recent Labs Lab 07/17/16 0200 07/17/16 2058 07/18/16 0302  NA 139  --  137  K 4.2  --  4.2  CL 107  --  104  CO2 24  --  28  GLUCOSE 127*  --  129*  BUN 25*  --  14  CREATININE 0.75 0.78 0.82  CALCIUM 9.0  --  8.2*   GFR: Estimated Creatinine Clearance: 51.1 mL/min (by C-G formula based on SCr of 0.82 mg/dL). Liver Function Tests: No results for input(s): AST, ALT, ALKPHOS, BILITOT, PROT, ALBUMIN in the last 168 hours. No results for input(s): LIPASE, AMYLASE in the last 168 hours.  No results for input(s): AMMONIA in the last 168 hours. Coagulation Profile:  Recent Labs Lab 07/17/16 0200  INR 0.98   Cardiac Enzymes: No results for input(s): CKTOTAL, CKMB, CKMBINDEX, TROPONINI in the last 168 hours. BNP (last 3 results) No results for input(s): PROBNP in the last 8760 hours. HbA1C: No results for input(s): HGBA1C in the last 72 hours. CBG: No results for input(s): GLUCAP in the last 168 hours. Lipid Profile: No results for input(s): CHOL, HDL, LDLCALC, TRIG, CHOLHDL, LDLDIRECT in the last 72 hours. Thyroid Function Tests: No results for input(s): TSH, T4TOTAL, FREET4, T3FREE, THYROIDAB in the last 72 hours. Anemia Panel: No results for input(s): VITAMINB12, FOLATE, FERRITIN, TIBC, IRON, RETICCTPCT in the last 72 hours. Sepsis Labs: No results for input(s): PROCALCITON,  LATICACIDVEN in the last 168 hours.  Recent Results (from the past 240 hour(s))  Surgical PCR screen     Status: None   Collection Time: 07/17/16  4:59 AM  Result Value Ref Range Status   MRSA, PCR NEGATIVE NEGATIVE Final   Staphylococcus aureus NEGATIVE NEGATIVE Final    Comment:        The Xpert SA Assay (FDA approved for NASAL specimens in patients over 62 years of age), is one component of a comprehensive surveillance program.  Test performance has been validated by [facility] for patients greater than or equal to 22 year old. It is not intended to diagnose infection nor to guide or monitor treatment.          Radiology Studies: Pelvis Portable  Result Date: 07/17/2016 CLINICAL DATA:  Post left total hip arthroplasty. EXAM: PORTABLE PELVIS 1-2 VIEWS COMPARISON:  Intraoperative images. FINDINGS: Patient is status post total left hip arthroplasty with screwed in acetabular component. No evidence of acute fractures. Expected postsurgical changes in the soft tissues. Skin staples are seen. IMPRESSION: Post total left hip arthroplasty without evidence of immediate complications. Electronically Signed   By: Ted Mcalpine M.D.   On: 07/17/2016 18:20   Chest Portable 1 View  Result Date: 07/17/2016 CLINICAL DATA:  Preoperative exam prior to hip fracture repair EXAM: PORTABLE CHEST 1 VIEW COMPARISON:  None in PACs FINDINGS: The lungs are adequately inflated. There is no focal infiltrate. The cardiac silhouette is mildly enlarged. The pulmonary vascularity is normal. There is calcification in the wall of the aortic arch. There is no pleural effusion or pneumothorax. 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 IMPRESSION: Mild cardiomegaly. No significant pulmonary vascular congestion or pulmonary edema. No pneumonia. Electronically Signed   By: David  [place] M.D.   On: 07/17/2016 07:48   Dg C-arm 1-60 Min  Result Date: 07/17/2016 CLINICAL DATA:  Intraoperative exam from left total  hip arthroplasty. EXAM: OPERATIVE LEFT HIP (WITH PELVIS IF PERFORMED) TECHNIQUE: Fluoroscopic spot image(s) were submitted for interpretation post-operatively. COMPARISON:  Radiograph 07/17/2016 FINDINGS: Initial fluoroscopic image demonstrates minimally displaced impacted subcapital left femoral neck fracture. Three component left total hip arthroplasty was performed with screwed in acetabular component and long stem femoral component. The left femoral neck has been removed. No evidence of immediate complications. IMPRESSION: Status post total left hip arthroplasty, without evidence of immediate complications. Electronically Signed   By: Ted Mcalpine M.D.   On: 07/17/2016 17:43   Dg Hip Operative Unilat W Or W/o Pelvis Left  Result Date: 07/17/2016 CLINICAL DATA:  Intraoperative exam from left total hip arthroplasty. EXAM: OPERATIVE LEFT HIP (WITH PELVIS IF PERFORMED) TECHNIQUE: Fluoroscopic spot image(s) were submitted for interpretation post-operatively. COMPARISON:  Radiograph  07/17/2016 FINDINGS: Initial fluoroscopic image demonstrates minimally displaced impacted subcapital left femoral neck fracture. Three component left total hip arthroplasty was performed with screwed in acetabular component and long stem femoral component. The left femoral neck has been removed. No evidence of immediate complications. IMPRESSION: Status post total left hip arthroplasty, without evidence of immediate complications. Electronically Signed   By: Ted Mcalpine M.D.   On: 07/17/2016 17:43   Dg Hip Unilat With Pelvis 2-3 Views Left  Result Date: 07/17/2016 CLINICAL DATA:  Missed a step, fell at son's home.  LEFT hip pain. EXAM: DG HIP (WITH OR WITHOUT PELVIS) 2-3V LEFT COMPARISON:  None. FINDINGS: Acute LEFT femoral neck fracture with impaction, slight lateral angulation of the distal bony fragments. No dislocation. No destructive bony lesions. Phleboliths project in the pelvis. Surgical sutures in the abdomen.  IMPRESSION: Acute mildly displaced LEFT femoral neck fracture without dislocation. Electronically Signed   By: Awilda Metro M.D.   On: 07/17/2016 00:49        Scheduled Meds: . amLODipine  10 mg Oral Daily  . atenolol  50 mg Oral Daily  . busPIRone  5 mg Oral QHS  . enoxaparin (LOVENOX) injection  40 mg Subcutaneous Q24H  . mirtazapine  7.5 mg Oral QHS  . multivitamin  1 tablet Oral Daily  . sertraline  25 mg Oral Daily   Continuous Infusions: . sodium chloride 125 mL/hr at 07/18/16 0616  . lactated ringers 75 mL/hr at 07/17/16 0444  . lactated ringers 10 mL/hr at 07/17/16 1511     LOS: 1 day    Time spent: 30 minutes.     Kathlen Mody, MD [facility] Pager [phone]   If 7PM-7AM, please contact night-coverage [email or web address] Password [facility] 07/18/2016, 6:51 PM

## 2016-07-18 NOTE — Progress Notes (Signed)
   Subjective:  Patient reports pain as moderate.  Trouble lifting left leg.  Objective:   VITALS:   Vitals:   07/17/16 2000 07/17/16 2019 07/18/16 0215 07/18/16 0507  BP:  114/60 (!) 114/53 (!) 122/55  Pulse: 65 66 74 77  Resp: 14 14 14 14   Temp: 98 F (36.7 C) 98.1 F (36.7 C) 99.5 F (37.5 C) 99.3 F (37.4 C)  TempSrc:  Oral Oral Oral  SpO2: 96% 92% 97% 96%  Weight:      Height:        Neurologically intact Neurovascular intact Sensation intact distally Intact pulses distally Dorsiflexion/Plantar flexion intact Incision: dressing C/D/I and no drainage No cellulitis present Compartment soft   Lab Results  Component Value Date   WBC 7.1 07/18/2016   HGB 10.9 (L) 07/18/2016   HCT 33.5 (L) 07/18/2016   MCV 89.1 07/18/2016   PLT 105 (L) 07/18/2016     Assessment/Plan:  1 Day Post-Op   - Expected postop acute blood loss anemia - will monitor for symptoms - Up with PT/OT - DVT ppx - SCDs, ambulation, lovenox - WBAT operative extremity - Pain control - patient much more alert today since being off of PCA  Cheral Almas 07/18/2016, 7:47 AM [phone]

## 2016-07-18 NOTE — NC FL2 (Signed)
[place] MEDICAID FL2 LEVEL OF CARE SCREENING TOOL     IDENTIFICATION  Patient Name: Zona Michalik Birthdate: 27-Oct-1936 Sex: female Admission Date (Current Location): 07/17/2016  [facility] and [place] Number:  Producer, television/film/video and Address:  [facility] [place], [address], [place], [place] [address]      Provider Number: [id number]  Attending Physician Name and Address:  Kathlen Mody, MD  Relative Name and Phone Number:       Current Level of Care:   Recommended Level of Care: Skilled Nursing Facility Prior Approval Number:    Date Approved/Denied:   PASRR Number:    Discharge Plan: SNF    Current Diagnoses: Patient Active Problem List   Diagnosis Date Noted  . Closed left femoral fracture, initial encounter 07/17/2016  . Essential hypertension 07/17/2016  . Anxiety and depression 07/17/2016    Orientation RESPIRATION BLADDER Height & Weight     Self, Time, Situation, Place  Normal Continent Weight: 150 lb (68 kg) Height:  5\' 2"  (157.5 cm)  BEHAVIORAL SYMPTOMS/MOOD NEUROLOGICAL BOWEL NUTRITION STATUS      Continent    AMBULATORY STATUS COMMUNICATION OF NEEDS Skin   Supervision Verbally Normal                       Personal Care Assistance Level of Assistance  Bathing           Functional Limitations Info             SPECIAL CARE FACTORS FREQUENCY                       Contractures      Additional Factors Info                  Current Medications (07/18/2016):  This is the current hospital active medication list Current Facility-Administered Medications  Medication Dose Route Frequency Provider Last Rate Last Dose  . 0.9 %  sodium chloride infusion   Intravenous Continuous Tarry Kos, MD 125 mL/hr at 07/18/16 [phone]    . acetaminophen (TYLENOL) tablet 650 mg  650 mg Oral Q6H PRN Naiping Donnelly Stager, MD       Or  . acetaminophen (TYLENOL) suppository 650 mg  650 mg Rectal Q6H PRN Naiping Donnelly Stager, MD      .  ALPRAZolam Prudy Feeler) tablet 0.25 mg  0.25 mg Oral BID PRN Jonah Blue, MD      . alum & mag hydroxide-simeth (MAALOX/MYLANTA) 200-200-20 MG/5ML suspension 30 mL  30 mL Oral Q4H PRN Tarry Kos, MD      . amLODipine (NORVASC) tablet 10 mg  10 mg Oral Daily Jonah Blue, MD      . atenolol (TENORMIN) tablet 50 mg  50 mg Oral Daily Jonah Blue, MD   50 mg at 07/17/16 1032  . busPIRone (BUSPAR) tablet 5 mg  5 mg Oral QHS Jonah Blue, MD   5 mg at 07/17/16 2135  . diphenhydrAMINE (BENADRYL) injection 12.5 mg  12.5 mg Intravenous Q6H PRN Jonah Blue, MD       Or  . diphenhydrAMINE (BENADRYL) 12.5 MG/5ML elixir 12.5 mg  12.5 mg Oral Q6H PRN Jonah Blue, MD      . enoxaparin (LOVENOX) injection 40 mg  40 mg Subcutaneous Q24H Naiping Donnelly Stager, MD   40 mg at 07/18/16 0845  . HYDROcodone-acetaminophen (NORCO/VICODIN) 5-325 MG per tablet 1-2 tablet  1-2 tablet Oral Q4H  PRN Kathlen Mody, MD      . HYDROmorphone (DILAUDID) injection 0.5-1 mg  0.5-1 mg Intravenous Q2H PRN Tarry Kos, MD   0.5 mg at 07/18/16 0702  . lactated ringers infusion   Intravenous Continuous Jonah Blue, MD 75 mL/hr at 07/17/16 0444    . lactated ringers infusion   Intravenous Continuous Karlyne Greenspan, MD 10 mL/hr at 07/17/16 1511    . menthol-cetylpyridinium (CEPACOL) lozenge 3 mg  1 lozenge Oral PRN Naiping Donnelly Stager, MD       Or  . phenol (CHLORASEPTIC) mouth spray 1 spray  1 spray Mouth/Throat PRN Naiping Donnelly Stager, MD      . methocarbamol (ROBAXIN) tablet 500 mg  500 mg Oral Q6H PRN Tarry Kos, MD   500 mg at 07/18/16 0241   Or  . methocarbamol (ROBAXIN) 500 mg in dextrose 5 % 50 mL IVPB  500 mg Intravenous Q6H PRN Naiping Donnelly Stager, MD      . metoCLOPramide (REGLAN) tablet 5-10 mg  5-10 mg Oral Q8H PRN Naiping Donnelly Stager, MD       Or  . metoCLOPramide (REGLAN) injection 5-10 mg  5-10 mg Intravenous Q8H PRN Tarry Kos, MD      . mirtazapine (REMERON) tablet 7.5 mg  7.5 mg Oral QHS Jonah Blue, MD   7.5 mg at 07/17/16 2135  .  multivitamin (PROSIGHT) tablet 1 tablet  1 tablet Oral Daily Jonah Blue, MD   1 tablet at 07/18/16 1203  . naloxone [facility]) injection 0.4 mg  0.4 mg Intravenous PRN Jonah Blue, MD       And  . sodium chloride flush (NS) 0.9 % injection 9 mL  9 mL Intravenous PRN Jonah Blue, MD      . ondansetron [facility]) injection 4 mg  4 mg Intravenous Q6H PRN Jonah Blue, MD      . sertraline (ZOLOFT) tablet 25 mg  25 mg Oral Daily Jonah Blue, MD   25 mg at 07/18/16 1209     Discharge Medications: Please see discharge summary for a list of discharge medications.  Relevant Imaging Results:  Relevant Lab Results:   Additional Information    Harlon Flor, [place]

## 2016-07-18 NOTE — Clinical Social Work Note (Signed)
Clinical Social Work Assessment  Patient Details  Name: Emily Escobar MRN: [id number] Date of Birth: 12-Dec-1936  Date of referral:  07/18/16               Reason for consult:  Facility Placement                Permission sought to share information with:   Acupuncturist) Permission granted to share information::   Acupuncturist)  Name::        Agency::     Relationship::     Contact Information:     Housing/Transportation Living arrangements for the past 2 months:  Single Family Home (Patient lives at home in [place] with her daughter.) Source of Information:  Patient Patient Interpreter Needed:  None Criminal Activity/Legal Involvement Pertinent to Current Situation/Hospitalization:  No - Comment as needed Significant Relationships:  Adult Children Lives with:  Adult Children ([name] in [place]) Do you feel safe going back to the place where you live?   (Patient is interested in facility) Need for family participation in patient care:  Yes (Comment) (Patient states that she has a good support system)  Care giving concerns:  Patient states that she broke her hip and now feels that she needs assistance with her ADL's.  Social Worker assessment / plan:  SW met with patient at bedside. She was alert and oriented. Daughter in law was present. Patient states that she presents to [facility] due to breaking her hip. Patient state that she fell. She states cause of fall was due to possibly missing a step. Patient states that prior to incident she was completing ADL's independently. Patient states that she feels as though she has a good support system.   Daughter in law states that pt may have fallen x3 within the past year  Employment status:  Retired Forensic scientist:   ([employer]) PT Recommendations:  [place] / Referral to community resources:   (SW spoke with pt and daughter in Sports coach about faciliites)  Patient/Family's Response to care:  Patient and daughter in  law are appropriate   Patient/Family's Understanding of and Emotional Response to Diagnosis, Current Treatment, and Prognosis:  Patient and family state that they have no questions for SW at this time.    Emotional Assessment Appearance:  Appears stated age Attitude/Demeanor/Rapport:   (Appropriate) Affect (typically observed):  Accepting, Appropriate Orientation:  Oriented to Self, Oriented to Place, Oriented to  Time, Oriented to Situation Alcohol / Substance use:  Not Applicable Psych involvement (Current and /or in the community):  No (Comment)  Discharge Needs  Concerns to be addressed:  Adjustment to Illness Readmission within the last 30 days:  No Current discharge risk:  None Barriers to Discharge:  No Barriers Identified   Bernita Buffy 07/18/2016, 3:34 PM

## 2016-07-18 NOTE — Evaluation (Signed)
Occupational Therapy Evaluation Patient Details Name: Emily Escobar MRN: [id number] DOB: 04/05/1936 Today's Date: 07/18/2016    History of Present Illness 80 y.o. female admitted with Lt hip fracture (fall) and now s/p direct anterior THA. PMH: history of falls, macular degeneration, hypertension, vertebral fx.    Clinical Impression   Pt was independent at baseline. She lives alone [place] and was visiting her family when she fell. Pt presents with severe L hip pain, generalized weakness and impaired balance interfering with ability to perform ADL and mobility at her baseline. Educated pt and her daughter in law in multiple uses of 3 in 1 and AE. Pt will need post acute rehab in SNF as she does not have 24 hour care. Will follow.    Follow Up Recommendations  SNF;Supervision/Assistance - 24 hour    Equipment Recommendations  3 in 1 bedside comode    Recommendations for Other Services       Precautions / Restrictions Precautions Precautions: Fall Restrictions Weight Bearing Restrictions: Yes LLE Weight Bearing: Weight bearing as tolerated      Mobility Bed Mobility Overal bed mobility: Needs Assistance Bed Mobility: Supine to Sit;Sit to Supine     Supine to sit: Max assist Sit to supine: Max assist   General bed mobility comments: assist for L LE and trunk, cues for sequencing, pt limited by pain  Transfers        General transfer comment: Unable to attempt due to pain.    Balance Overall balance assessment: Needs assistance Sitting-balance support: Single extremity supported Sitting balance-Leahy Scale: Poor     Standing balance support: Bilateral upper extremity supported Standing balance-Leahy Scale: Poor Standing balance comment: using rw for support                            ADL Overall ADL's : Needs assistance/impaired Eating/Feeding: Set up;Bed level   Grooming: Wash/dry hands;Wash/dry face;Bed level;Set up   Upper Body Bathing: Moderate  assistance;Bed level   Lower Body Bathing: Total assistance;Bed level;Sitting/lateral leans   Upper Body Dressing : Minimal assistance;Bed level   Lower Body Dressing: Total assistance;Bed level;Sitting/lateral leans                 General ADL Comments: Limited ability to participate in ADL due to severe pain.     Vision     Perception     Praxis      Pertinent Vitals/Pain Pain Assessment: 0-10 Pain Score: 10-Worst pain ever Pain Location: L hip Pain Descriptors / Indicators: Aching;Grimacing;Guarding Pain Intervention(s): Limited activity within patient's tolerance;Ice applied;Patient requesting pain meds-RN notified;Monitored during session     Hand Dominance Right   Extremity/Trunk Assessment Upper Extremity Assessment Upper Extremity Assessment: RUE deficits/detail RUE Deficits / Details: s/p shoulder replacement x 3 years ago, cannot use to pull up in bed   Lower Extremity Assessment Lower Extremity Assessment: Defer to PT evaluation LLE Deficits / Details: Needing assist with moving LE, pt reports due to pain.        Communication Communication Communication: No difficulties   Cognition Arousal/Alertness: Awake/alert Behavior During Therapy: WFL for tasks assessed/performed Overall Cognitive Status: Within Functional Limits for tasks assessed                     General Comments       Exercises       Shoulder Instructions      Home Living Family/patient expects to be discharged  to:: Skilled nursing facility Living Arrangements: Alone                               Additional Comments: Pt lives with daughter in [place], was visiting her daughter when she fell.       Prior Functioning/Environment Level of Independence: Independent             OT Diagnosis: Generalized weakness;Acute pain   OT Problem List: Decreased strength;Decreased range of motion;Decreased activity tolerance;Impaired balance (sitting and/or  standing);Decreased knowledge of use of DME or AE;Pain;Impaired UE functional use   OT Treatment/Interventions: Self-care/ADL training;Balance training;Patient/family education;Therapeutic activities;DME and/or AE instruction    OT Goals(Current goals can be found in the care plan section) Acute Rehab OT Goals Patient Stated Goal: eventually get back to [place] OT Goal Formulation: With patient Time For Goal Achievement: 08/01/16 Potential to Achieve Goals: Good ADL Goals Pt Will Perform Grooming: with min guard assist;standing Pt Will Perform Lower Body Bathing: with min guard assist;with adaptive equipment;sit to/from stand Pt Will Perform Lower Body Dressing: with min guard assist;with adaptive equipment;sit to/from stand Pt Will Transfer to Toilet: with min guard assist;ambulating;bedside commode (over toilet) Pt Will Perform Toileting - Clothing Manipulation and hygiene: with min guard assist;sit to/from stand Additional ADL Goal #1: Pt will perform bed mobility with min guard assist in preparation for ADL at EOB.  OT Frequency: Min 2X/week   Barriers to D/C:            Co-evaluation              End of Session Nurse Communication: Patient requests pain meds  Activity Tolerance: Patient limited by pain Patient left: in bed;with call bell/phone within reach;with family/visitor present   Time: 1610-9604 OT Time Calculation (min): 30 min Charges:  OT General Charges $OT Visit: 1 Procedure OT Evaluation $OT Eval Low Complexity: 1 Procedure OT Treatments $Self Care/Home Management : 8-22 mins G-Codes:    Evern Bio 07/18/2016, 4:03 PM  [phone]

## 2016-07-19 LAB — BASIC METABOLIC PANEL
ANION GAP: 6 (ref 5–15)
BUN: 9 mg/dL (ref 6–20)
CALCIUM: 8.5 mg/dL — AB (ref 8.9–10.3)
CO2: 28 mmol/L (ref 22–32)
CREATININE: 0.69 mg/dL (ref 0.44–1.00)
Chloride: 104 mmol/L (ref 101–111)
Glucose, Bld: 123 mg/dL — ABNORMAL HIGH (ref 65–99)
Potassium: 3.7 mmol/L (ref 3.5–5.1)
SODIUM: 138 mmol/L (ref 135–145)

## 2016-07-19 LAB — PLATELET COUNT: PLATELETS: 126 10*3/uL — AB (ref 150–400)

## 2016-07-19 LAB — CBC
HEMATOCRIT: 31.5 % — AB (ref 36.0–46.0)
Hemoglobin: 10.2 g/dL — ABNORMAL LOW (ref 12.0–15.0)
MCH: 28.5 pg (ref 26.0–34.0)
MCHC: 32.4 g/dL (ref 30.0–36.0)
MCV: 88 fL (ref 78.0–100.0)
PLATELETS: 92 10*3/uL — AB (ref 150–400)
RBC: 3.58 MIL/uL — AB (ref 3.87–5.11)
RDW: 13.7 % (ref 11.5–15.5)
WBC: 7.1 10*3/uL (ref 4.0–10.5)

## 2016-07-19 MED ORDER — ASPIRIN 325 MG PO TABS
325.0000 mg | ORAL_TABLET | Freq: Every day | ORAL | Status: DC
  Administered 2016-07-20 – 2016-07-21 (×2): 325 mg via ORAL
  Filled 2016-07-19 (×2): qty 1

## 2016-07-19 NOTE — Progress Notes (Signed)
Physical Therapy Treatment Patient Details Name: Emily Escobar MRN: [id number] DOB: 02-18-1936 Today's Date: 07/19/2016    History of Present Illness 80 y.o. female admitted with Lt hip fracture (fall) and now s/p direct anterior THA. PMH: history of falls, macular degeneration, hypertension, vertebral fx.     PT Comments    Patient is progressing gradually toward mobility goals. Tolerated short distance ambulation today with increased pain and c/o burning in L thigh. Reviewed HEP with pt and family and encouraged to work on this evening when pain is under control. Continue to progress as tolerated.   Follow Up Recommendations  SNF;Supervision for mobility/OOB     Equipment Recommendations  Other (comment) (to be assessed at next venue)    Recommendations for Other Services       Precautions / Restrictions Precautions Precautions: Fall Restrictions Weight Bearing Restrictions: Yes LLE Weight Bearing: Weight bearing as tolerated    Mobility  Bed Mobility Overal bed mobility: Needs Assistance Bed Mobility: Supine to Sit     Supine to sit: Mod assist     General bed mobility comments: assist to bring L LE to EOB and elevate trunk into sitting; cues for sequencing and technique  Transfers Overall transfer level: Needs assistance Equipment used: Rolling walker (2 wheeled) Transfers: Sit to/from Stand Sit to Stand: Min assist;Min guard Stand pivot transfers:  (bed-chair-BSC-chair)       General transfer comment: min A first trial from EOB and min guard next 2 trials from recliner and 3 in 1; cues for safe hand placmeent and technique  Ambulation/Gait Ambulation/Gait assistance: Min assist Ambulation Distance (Feet): 35 Feet (15,10,10) Assistive device: Rolling walker (2 wheeled) Gait Pattern/deviations: Step-to pattern;Decreased step length - right;Decreased stance time - left;Decreased weight shift to left;Antalgic Gait velocity: very slow pattern   General Gait  Details: cues for sequencing, proximity of RW, and weight shifting; assist for management of RW at times and for balance initially when weight shifting; 2 seated rest breaks   Stairs            Wheelchair Mobility    Modified Rankin (Stroke Patients Only)       Balance     Sitting balance-Leahy Scale: Fair       Standing balance-Leahy Scale: Poor                      Cognition Arousal/Alertness: Awake/alert Behavior During Therapy: WFL for tasks assessed/performed Overall Cognitive Status: Within Functional Limits for tasks assessed                      Exercises      General Comments        Pertinent Vitals/Pain Pain Assessment: 0-10 Pain Score: 6  Pain Location: L hip Pain Descriptors / Indicators: Aching;Burning;Guarding;Sore Pain Intervention(s): Limited activity within patient's tolerance;Monitored during session;Repositioned;Patient requesting pain meds-RN notified;Ice applied    Home Living                      Prior Function            PT Goals (current goals can now be found in the care plan section) Acute Rehab PT Goals Patient Stated Goal: be independent again PT Goal Formulation: With patient Time For Goal Achievement: 08/01/16 Potential to Achieve Goals: Good Progress towards PT goals: Progressing toward goals    Frequency  Min 3X/week    PT Plan Current plan remains appropriate    Co-evaluation  End of Session Equipment Utilized During Treatment: Gait belt Activity Tolerance: Patient limited by pain Patient left: with call bell/phone within reach;with family/visitor present;in bed;with bed alarm set     Time: 1551-1630 PT Time Calculation (min) (ACUTE ONLY): 39 min  Charges:  $Gait Training: 23-37 mins $Therapeutic Activity: 8-22 mins                    G Codes:      Derek Mound, PTA Pager: [phone]   07/19/2016, 5:10 PM

## 2016-07-19 NOTE — Progress Notes (Signed)
Subjective: 2 Days Post-Op Procedure(s) (LRB): TOTAL HIP ARTHROPLASTY ANTERIOR APPROACH (Left) Patient reports pain as moderate.    Objective: Vital signs in last 24 hours: Temp:  [98.1 F (36.7 C)-98.8 F (37.1 C)] 98.6 F (37 C) (08/05 0300) Pulse Rate:  [58-85] 75 (08/05 0300) Resp:  [16] 16 (08/05 0300) BP: (102-139)/(48-60) 139/60 (08/05 0300) SpO2:  [98 %-99 %] 98 % (08/05 0300)  Intake/Output from previous day: 08/04 0701 - 08/05 0700 In: 540 [P.O.:540] Out: 400 [Urine:400] Intake/Output this shift: No intake/output data recorded.   Recent Labs  07/17/16 0200 07/17/16 2058 07/18/16 0302 07/19/16 0444  HGB 13.9 11.7* 10.9* 10.2*    Recent Labs  07/18/16 0302 07/19/16 0444  WBC 7.1 7.1  RBC 3.76* 3.58*  HCT 33.5* 31.5*  PLT 105* 92*    Recent Labs  07/18/16 0302 07/19/16 0444  NA 137 138  K 4.2 3.7  CL 104 104  CO2 28 28  BUN 14 9  CREATININE 0.82 0.69  GLUCOSE 129* 123*  CALCIUM 8.2* 8.5*    Recent Labs  07/17/16 0200  INR 0.98    Neurologically intact  Assessment/Plan: 2 Days Post-Op Procedure(s) (LRB): TOTAL HIP ARTHROPLASTY ANTERIOR APPROACH (Left) Up with therapy  Kery Batzel C 07/19/2016, 10:06 AM

## 2016-07-19 NOTE — Discharge Summary (Signed)
Physician Discharge Summary  Emily Escobar ZOX:[id number] DOB: 1935-12-22 DOA: 07/17/2016  PCP: No PCP Per Patient  Admit date: 07/17/2016 Discharge date: 07/20/2016  Admitted From: HOME Disposition:  SNF.  Recommendations for Outpatient Follow-up:  1. Follow up with PCP in 1-2 weeks 2. Please obtain BMP/CBC in one week 3. Please follow up with orthopedics as recommended.     Discharge Condition:stable.  CODE STATUS:FULL  Diet recommendation: Heart Healthy   Brief/Interim Summary: Emily Escobar a 80 y.o.femalewith medical history significant of HTN and frequent falls presenting with hip fracture following a  Mechanical fall.  Discharge Diagnoses:  Principal Problem:   Closed left femoral fracture, initial encounter Active Problems:   Essential hypertension   Anxiety and depression  Left femoral fracture; underwent left hp replacement. Pain control. Appreciate orthopedics recommendations.  PT eval, recommending SNF.    Hypertension: well controlled.   Anemia: probably blood loss anemia.  DROPPED from 13 to 10.    Mild thrombocytopenia: Discussed with Dr Ophelia Charter regarding the platelet count and lovenox Changed the lovenox to 325 mg daily aspirin for the 2 weeks.    Depression and anxiety:  Resume home meds.   Discharge Instructions  Discharge Instructions    Weight bearing as tolerated    Complete by:  As directed         Follow-up Information    Cheral Almas, MD In 2 weeks.   Specialty:  Orthopedic Surgery Why:  For suture removal, For wound re-check Contact information: [address] [place] [address] [phone]          Allergies  Allergen Reactions  . Codeine     Pt states she has tolerated norco in the past  . Fentanyl Other (See Comments)    Pt reports having hallucinations.  . Morphine And Related     Patient's son states pt feels "looping" with morphine     Consultations:  Orthopedics.     Procedures/Studies: Pelvis Portable  Result Date: 07/17/2016 CLINICAL DATA:  Post left total hip arthroplasty. EXAM: PORTABLE PELVIS 1-2 VIEWS COMPARISON:  Intraoperative images. FINDINGS: Patient is status post total left hip arthroplasty with screwed in acetabular component. No evidence of acute fractures. Expected postsurgical changes in the soft tissues. Skin staples are seen. IMPRESSION: Post total left hip arthroplasty without evidence of immediate complications. Electronically Signed   By: Ted Mcalpine M.D.   On: 07/17/2016 18:20   Chest Portable 1 View  Result Date: 07/17/2016 CLINICAL DATA:  Preoperative exam prior to hip fracture repair EXAM: PORTABLE CHEST 1 VIEW COMPARISON:  None in PACs FINDINGS: The lungs are adequately inflated. There is no focal infiltrate. The cardiac silhouette is mildly enlarged. The pulmonary vascularity is normal. There is calcification in the wall of the aortic arch. There is no pleural effusion or pneumothorax. 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 IMPRESSION: Mild cardiomegaly. No significant pulmonary vascular congestion or pulmonary edema. No pneumonia. Electronically Signed   By: David  [place] M.D.   On: 07/17/2016 07:48   Dg C-arm 1-60 Min  Result Date: 07/17/2016 CLINICAL DATA:  Intraoperative exam from left total hip arthroplasty. EXAM: OPERATIVE LEFT HIP (WITH PELVIS IF PERFORMED) TECHNIQUE: Fluoroscopic spot image(s) were submitted for interpretation post-operatively. COMPARISON:  Radiograph 07/17/2016 FINDINGS: Initial fluoroscopic image demonstrates minimally displaced impacted subcapital left femoral neck fracture. Three component left total hip arthroplasty was performed with screwed in acetabular component and long stem femoral component. The left femoral neck has been removed. No evidence of immediate  complications. IMPRESSION: Status post total left hip arthroplasty, without evidence of immediate complications. Electronically Signed    By: Ted Mcalpine M.D.   On: 07/17/2016 17:43   Dg Hip Operative Unilat W Or W/o Pelvis Left  Result Date: 07/17/2016 CLINICAL DATA:  Intraoperative exam from left total hip arthroplasty. EXAM: OPERATIVE LEFT HIP (WITH PELVIS IF PERFORMED) TECHNIQUE: Fluoroscopic spot image(s) were submitted for interpretation post-operatively. COMPARISON:  Radiograph 07/17/2016 FINDINGS: Initial fluoroscopic image demonstrates minimally displaced impacted subcapital left femoral neck fracture. Three component left total hip arthroplasty was performed with screwed in acetabular component and long stem femoral component. The left femoral neck has been removed. No evidence of immediate complications. IMPRESSION: Status post total left hip arthroplasty, without evidence of immediate complications. Electronically Signed   By: Ted Mcalpine M.D.   On: 07/17/2016 17:43   Dg Hip Unilat With Pelvis 2-3 Views Left  Result Date: 07/17/2016 CLINICAL DATA:  Missed a step, fell at son's home.  LEFT hip pain. EXAM: DG HIP (WITH OR WITHOUT PELVIS) 2-3V LEFT COMPARISON:  None. FINDINGS: Acute LEFT femoral neck fracture with impaction, slight lateral angulation of the distal bony fragments. No dislocation. No destructive bony lesions. Phleboliths project in the pelvis. Surgical sutures in the abdomen. IMPRESSION: Acute mildly displaced LEFT femoral neck fracture without dislocation. Electronically Signed   By: Awilda Metro M.D.   On: 07/17/2016 00:49      Subjective: No new complaints.   Discharge Exam: Vitals:   07/19/16 1019 07/19/16 1312  BP: (!) 124/49 (!) 132/59  Pulse: 83 85  Resp:  16  Temp:  97.9 F (36.6 C)   Vitals:   07/18/16 2149 07/19/16 0300 07/19/16 1019 07/19/16 1312  BP: (!) 133/55 139/60 (!) 124/49 (!) 132/59  Pulse: 85 75 83 85  Resp: 16 16  16   Temp: 98.8 F (37.1 C) 98.6 F (37 C)  97.9 F (36.6 C)  TempSrc: Oral Oral  Oral  SpO2: 98% 98%  95%  Weight:      Height:         General: Pt is alert, awake, not in acute distress Cardiovascular: RRR, S1/S2 +, no rubs, no gallops Respiratory: CTA bilaterally, no wheezing, no rhonchi Abdominal: Soft, NT, ND, bowel sounds + Extremities: no edema, no cyanosis    The results of significant diagnostics from this hospitalization (including imaging, microbiology, ancillary and laboratory) are listed below for reference.     Microbiology: Recent Results (from the past 240 hour(s))  Surgical PCR screen     Status: None   Collection Time: 07/17/16  4:59 AM  Result Value Ref Range Status   MRSA, PCR NEGATIVE NEGATIVE Final   Staphylococcus aureus NEGATIVE NEGATIVE Final    Comment:        The Xpert SA Assay (FDA approved for NASAL specimens in patients over 44 years of age), is one component of a comprehensive surveillance program.  Test performance has been validated by [facility] for patients greater than or equal to 42 year old. It is not intended to diagnose infection nor to guide or monitor treatment.      Labs: BNP (last 3 results) No results for input(s): BNP in the last 8760 hours. Basic Metabolic Panel:  Recent Labs Lab 07/17/16 0200 07/17/16 2058 07/18/16 0302 07/19/16 0444  NA 139  --  137 138  K 4.2  --  4.2 3.7  CL 107  --  104 104  CO2 24  --  28 28  GLUCOSE 127*  --  129* 123*  BUN 25*  --  14 9  CREATININE 0.75 0.78 0.82 0.69  CALCIUM 9.0  --  8.2* 8.5*   Liver Function Tests: No results for input(s): AST, ALT, ALKPHOS, BILITOT, PROT, ALBUMIN in the last 168 hours. No results for input(s): LIPASE, AMYLASE in the last 168 hours. No results for input(s): AMMONIA in the last 168 hours. CBC:  Recent Labs Lab 07/17/16 0200 07/17/16 2058 07/18/16 0302 07/19/16 0444 07/19/16 1411  WBC 12.1* 9.6 7.1 7.1  --   NEUTROABS 10.3*  --   --   --   --   HGB 13.9 11.7* 10.9* 10.2*  --   HCT 41.3 36.6 33.5* 31.5*  --   MCV 85.7 89.1 89.1 88.0  --   PLT 145* 130* 105* 92* 126*    Cardiac Enzymes: No results for input(s): CKTOTAL, CKMB, CKMBINDEX, TROPONINI in the last 168 hours. BNP: Invalid input(s): POCBNP CBG: No results for input(s): GLUCAP in the last 168 hours. D-Dimer No results for input(s): DDIMER in the last 72 hours. Hgb A1c No results for input(s): HGBA1C in the last 72 hours. Lipid Profile No results for input(s): CHOL, HDL, LDLCALC, TRIG, CHOLHDL, LDLDIRECT in the last 72 hours. Thyroid function studies No results for input(s): TSH, T4TOTAL, T3FREE, THYROIDAB in the last 72 hours.  Invalid input(s): FREET3 Anemia work up No results for input(s): VITAMINB12, FOLATE, FERRITIN, TIBC, IRON, RETICCTPCT in the last 72 hours. Urinalysis No results found for: COLORURINE, APPEARANCEUR, LABSPEC, PHURINE, GLUCOSEU, HGBUR, BILIRUBINUR, KETONESUR, PROTEINUR, UROBILINOGEN, NITRITE, LEUKOCYTESUR Sepsis Labs Invalid input(s): PROCALCITONIN,  WBC,  LACTICIDVEN Microbiology Recent Results (from the past 240 hour(s))  Surgical PCR screen     Status: None   Collection Time: 07/17/16  4:59 AM  Result Value Ref Range Status   MRSA, PCR NEGATIVE NEGATIVE Final   Staphylococcus aureus NEGATIVE NEGATIVE Final    Comment:        The Xpert SA Assay (FDA approved for NASAL specimens in patients over 80 years of age), is one component of a comprehensive surveillance program.  Test performance has been validated by [facility] for patients greater than or equal to 37 year old. It is not intended to diagnose infection nor to guide or monitor treatment.      Time coordinating discharge: Over 30 minutes  SIGNED:   Kathlen Mody, MD  [facility] 07/19/2016, 6:14 PM Pager   If 7PM-7AM, please contact night-coverage [email or web address] Password [email or web address]

## 2016-07-20 LAB — CBC
HCT: 31.4 % — ABNORMAL LOW (ref 36.0–46.0)
HEMOGLOBIN: 10.1 g/dL — AB (ref 12.0–15.0)
MCH: 28.5 pg (ref 26.0–34.0)
MCHC: 32.2 g/dL (ref 30.0–36.0)
MCV: 88.5 fL (ref 78.0–100.0)
PLATELETS: 112 10*3/uL — AB (ref 150–400)
RBC: 3.55 MIL/uL — AB (ref 3.87–5.11)
RDW: 13.7 % (ref 11.5–15.5)
WBC: 6.1 10*3/uL (ref 4.0–10.5)

## 2016-07-20 MED ORDER — ASPIRIN 325 MG PO TABS: 325.0000 mg | ORAL_TABLET | Freq: Every day | ORAL | 0 refills | Status: DC

## 2016-07-20 NOTE — Discharge Summary (Addendum)
Physician Discharge Summary  Novis League ZOX:[id number] DOB: 1936/01/17 DOA: 07/17/2016  PCP: No PCP Per Patient  Admit date: 07/17/2016 Discharge date: 07/21/2016  Admitted From: HOME Disposition:  SNF.  Recommendations for Outpatient Follow-up:  1. Follow up with PCP in 1-2 weeks 2. Please obtain BMP/CBC in one week 3. Please follow up with orthopedics as recommended.     Discharge Condition:stable.  CODE STATUS:FULL  Diet recommendation: Heart Healthy   Brief/Interim Summary: Emily Escobar a 80 y.o.femalewith medical history significant of HTN and frequent falls presenting with hip fracture following a  Mechanical fall.  Discharge Diagnoses:  Principal Problem:   Closed left femoral fracture, initial encounter Active Problems:   Essential hypertension   Anxiety and depression  Left femoral fracture; underwent left hp replacement. Pain control. Appreciate orthopedics recommendations.  PT eval, recommending SNF.    Hypertension: well controlled.   Anemia: probably blood loss anemia.  DROPPED from 13 to 10.  Stable around 10.    Mild thrombocytopenia: Discussed with Dr Ophelia Charter regarding the platelet count and lovenox Changed the lovenox to 325 mg daily aspirin for the 2 weeks.  Recommend checking cbc in one week.   Depression and anxiety:  Resume home meds.   Discharge Instructions  Discharge Instructions    Diet - low sodium heart healthy    Complete by:  As directed   Discharge instructions    Complete by:  As directed   PLEASE FOLLOW UP WITH ORTHOPEDICS AS RECOMMENDED.  CHECK CBC IN ONE WEEK.   Weight bearing as tolerated    Complete by:  As directed         Follow-up Information    Cheral Almas, MD Follow up in 2 week(s).   Specialty:  Orthopedic Surgery Why:  For suture removal, For wound re-check Contact information: [address] [place] [address] [phone]          Allergies  Allergen Reactions  . Codeine      Pt states she has tolerated norco in the past  . Fentanyl Other (See Comments)    Pt reports having hallucinations.  . Morphine And Related     Patient's son states pt feels "looping" with morphine     Consultations:  Orthopedics.    Procedures/Studies: Pelvis Portable  Result Date: 07/17/2016 CLINICAL DATA:  Post left total hip arthroplasty. EXAM: PORTABLE PELVIS 1-2 VIEWS COMPARISON:  Intraoperative images. FINDINGS: Patient is status post total left hip arthroplasty with screwed in acetabular component. No evidence of acute fractures. Expected postsurgical changes in the soft tissues. Skin staples are seen. IMPRESSION: Post total left hip arthroplasty without evidence of immediate complications. Electronically Signed   By: Ted Mcalpine M.D.   On: 07/17/2016 18:20   Chest Portable 1 View  Result Date: 07/17/2016 CLINICAL DATA:  Preoperative exam prior to hip fracture repair EXAM: PORTABLE CHEST 1 VIEW COMPARISON:  None in PACs FINDINGS: The lungs are adequately inflated. There is no focal infiltrate. The cardiac silhouette is mildly enlarged. The pulmonary vascularity is normal. There is calcification in the wall of the aortic arch. There is no pleural effusion or pneumothorax. 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 IMPRESSION: Mild cardiomegaly. No significant pulmonary vascular congestion or pulmonary edema. No pneumonia. Electronically Signed   By: David  [place] M.D.   On: 07/17/2016 07:48   Dg C-arm 1-60 Min  Result Date: 07/17/2016 CLINICAL DATA:  Intraoperative exam from left total hip arthroplasty. EXAM: OPERATIVE LEFT HIP (WITH PELVIS IF PERFORMED)  TECHNIQUE: Fluoroscopic spot image(s) were submitted for interpretation post-operatively. COMPARISON:  Radiograph 07/17/2016 FINDINGS: Initial fluoroscopic image demonstrates minimally displaced impacted subcapital left femoral neck fracture. Three component left total hip arthroplasty was performed with screwed in acetabular  component and long stem femoral component. The left femoral neck has been removed. No evidence of immediate complications. IMPRESSION: Status post total left hip arthroplasty, without evidence of immediate complications. Electronically Signed   By: Ted Mcalpine M.D.   On: 07/17/2016 17:43   Dg Hip Operative Unilat W Or W/o Pelvis Left  Result Date: 07/17/2016 CLINICAL DATA:  Intraoperative exam from left total hip arthroplasty. EXAM: OPERATIVE LEFT HIP (WITH PELVIS IF PERFORMED) TECHNIQUE: Fluoroscopic spot image(s) were submitted for interpretation post-operatively. COMPARISON:  Radiograph 07/17/2016 FINDINGS: Initial fluoroscopic image demonstrates minimally displaced impacted subcapital left femoral neck fracture. Three component left total hip arthroplasty was performed with screwed in acetabular component and long stem femoral component. The left femoral neck has been removed. No evidence of immediate complications. IMPRESSION: Status post total left hip arthroplasty, without evidence of immediate complications. Electronically Signed   By: Ted Mcalpine M.D.   On: 07/17/2016 17:43   Dg Hip Unilat With Pelvis 2-3 Views Left  Result Date: 07/17/2016 CLINICAL DATA:  Missed a step, fell at son's home.  LEFT hip pain. EXAM: DG HIP (WITH OR WITHOUT PELVIS) 2-3V LEFT COMPARISON:  None. FINDINGS: Acute LEFT femoral neck fracture with impaction, slight lateral angulation of the distal bony fragments. No dislocation. No destructive bony lesions. Phleboliths project in the pelvis. Surgical sutures in the abdomen. IMPRESSION: Acute mildly displaced LEFT femoral neck fracture without dislocation. Electronically Signed   By: Awilda Metro M.D.   On: 07/17/2016 00:49     Subjective: No new complaints.   Discharge Exam: Vitals:   07/19/16 2000 07/20/16 0409  BP: 140/60 (!) 128/58  Pulse: 87 79  Resp:    Temp:  98.2 F (36.8 C)   Vitals:   07/19/16 1019 07/19/16 1312 07/19/16 2000  07/20/16 0409  BP: (!) 124/49 (!) 132/59 140/60 (!) 128/58  Pulse: 83 85 87 79  Resp:  16    Temp:  97.9 F (36.6 C)  98.2 F (36.8 C)  TempSrc:  Oral  Oral  SpO2:  95% 100% 98%  Weight:      Height:        General: Pt is alert, awake, not in acute distress Cardiovascular: RRR, S1/S2 +, no rubs, no gallops Respiratory: CTA bilaterally, no wheezing, no rhonchi Abdominal: Soft, NT, ND, bowel sounds + Extremities: no edema, no cyanosis    The results of significant diagnostics from this hospitalization (including imaging, microbiology, ancillary and laboratory) are listed below for reference.     Microbiology: Recent Results (from the past 240 hour(s))  Surgical PCR screen     Status: None   Collection Time: 07/17/16  4:59 AM  Result Value Ref Range Status   MRSA, PCR NEGATIVE NEGATIVE Final   Staphylococcus aureus NEGATIVE NEGATIVE Final    Comment:        The Xpert SA Assay (FDA approved for NASAL specimens in patients over 105 years of age), is one component of a comprehensive surveillance program.  Test performance has been validated by [facility] for patients greater than or equal to 53 year old. It is not intended to diagnose infection nor to guide or monitor treatment.      Labs: BNP (last 3 results) No results for input(s): BNP in the last  8760 hours. Basic Metabolic Panel:  Recent Labs Lab 07/17/16 0200 07/17/16 2058 07/18/16 0302 07/19/16 0444  NA 139  --  137 138  K 4.2  --  4.2 3.7  CL 107  --  104 104  CO2 24  --  28 28  GLUCOSE 127*  --  129* 123*  BUN 25*  --  14 9  CREATININE 0.75 0.78 0.82 0.69  CALCIUM 9.0  --  8.2* 8.5*   Liver Function Tests: No results for input(s): AST, ALT, ALKPHOS, BILITOT, PROT, ALBUMIN in the last 168 hours. No results for input(s): LIPASE, AMYLASE in the last 168 hours. No results for input(s): AMMONIA in the last 168 hours. CBC:  Recent Labs Lab 07/17/16 0200 07/17/16 2058 07/18/16 0302  07/19/16 0444 07/19/16 1411 07/20/16 0739  WBC 12.1* 9.6 7.1 7.1  --  6.1  NEUTROABS 10.3*  --   --   --   --   --   HGB 13.9 11.7* 10.9* 10.2*  --  10.1*  HCT 41.3 36.6 33.5* 31.5*  --  31.4*  MCV 85.7 89.1 89.1 88.0  --  88.5  PLT 145* 130* 105* 92* 126* 112*   Cardiac Enzymes: No results for input(s): CKTOTAL, CKMB, CKMBINDEX, TROPONINI in the last 168 hours. BNP: Invalid input(s): POCBNP CBG: No results for input(s): GLUCAP in the last 168 hours. D-Dimer No results for input(s): DDIMER in the last 72 hours. Hgb A1c No results for input(s): HGBA1C in the last 72 hours. Lipid Profile No results for input(s): CHOL, HDL, LDLCALC, TRIG, CHOLHDL, LDLDIRECT in the last 72 hours. Thyroid function studies No results for input(s): TSH, T4TOTAL, T3FREE, THYROIDAB in the last 72 hours.  Invalid input(s): FREET3 Anemia work up No results for input(s): VITAMINB12, FOLATE, FERRITIN, TIBC, IRON, RETICCTPCT in the last 72 hours. Urinalysis No results found for: COLORURINE, APPEARANCEUR, LABSPEC, PHURINE, GLUCOSEU, HGBUR, BILIRUBINUR, KETONESUR, PROTEINUR, UROBILINOGEN, NITRITE, LEUKOCYTESUR Sepsis Labs Invalid input(s): PROCALCITONIN,  WBC,  LACTICIDVEN Microbiology Recent Results (from the past 240 hour(s))  Surgical PCR screen     Status: None   Collection Time: 07/17/16  4:59 AM  Result Value Ref Range Status   MRSA, PCR NEGATIVE NEGATIVE Final   Staphylococcus aureus NEGATIVE NEGATIVE Final    Comment:        The Xpert SA Assay (FDA approved for NASAL specimens in patients over 78 years of age), is one component of a comprehensive surveillance program.  Test performance has been validated by [facility] for patients greater than or equal to 78 year old. It is not intended to diagnose infection nor to guide or monitor treatment.      Time coordinating discharge: Over 30 minutes  SIGNED:   Kathlen ModyAKULA,Advit Trethewey, MD  [facility] 07/20/2016, 10:14 AM Pager   If  7PM-7AM, please contact night-coverage [email or web address] Password [email or web address]

## 2016-07-20 NOTE — Clinical Social Work Note (Signed)
CSW attempted to get pt PSARR from ncmust. PSARR is still under review and pt will not be able to discharge until Monday. CSW paged MD provide update about pt discharging Monday instead of today due to issues with pt PSARR.

## 2016-07-20 NOTE — Progress Notes (Signed)
Physical Therapy Treatment Patient Details Name: Emily Escobar MRN: [id number] DOB: 03/22/1936 Today's Date: 07/20/2016    History of Present Illness 80 y.o. female admitted with Lt hip fracture (fall) and now s/p direct anterior THA. PMH: history of falls, macular degeneration, hypertension, vertebral fx.     PT Comments    Pt making gradual progress with mobility, able to ambulate 45 feet with rw and min guard assist. Continue to recommend SNF for further rehabilitation following acute stay.   Follow Up Recommendations  SNF;Supervision for mobility/OOB     Equipment Recommendations  None recommended by PT    Recommendations for Other Services       Precautions / Restrictions Precautions Precautions: Fall Restrictions Weight Bearing Restrictions: Yes LLE Weight Bearing: Weight bearing as tolerated    Mobility  Bed Mobility Overal bed mobility: Needs Assistance Bed Mobility: Supine to Sit     Supine to sit: Mod assist     General bed mobility comments: mod assist needed with LLE and cues for sequence.  Transfers Overall transfer level: Needs assistance Equipment used: Rolling walker (2 wheeled) Transfers: Sit to/from Stand Sit to Stand: Min guard         General transfer comment: reminder for hand placement.   Ambulation/Gait Ambulation/Gait assistance: Min guard Ambulation Distance (Feet): 45 Feet Assistive device: Rolling walker (2 wheeled) Gait Pattern/deviations: Step-to pattern;Decreased weight shift to left Gait velocity:  slow pattern   General Gait Details: working on weightbearing through LLE with stance phase.  Pt taking 2 standing rest breaks.    Stairs            Wheelchair Mobility    Modified Rankin (Stroke Patients Only)       Balance Overall balance assessment: Needs assistance Sitting-balance support: No upper extremity supported Sitting balance-Leahy Scale: Fair     Standing balance support: Bilateral upper extremity  supported Standing balance-Leahy Scale: Poor Standing balance comment: using rw                    Cognition Arousal/Alertness: Awake/alert Behavior During Therapy: WFL for tasks assessed/performed Overall Cognitive Status: Within Functional Limits for tasks assessed                      Exercises Total Joint Exercises Ankle Circles/Pumps: AROM;Both;15 reps Quad Sets: Strengthening;Left;10 reps Heel Slides: AAROM;Left;10 reps Hip ABduction/ADduction: Strengthening;Left;10 reps (mod assist)    General Comments        Pertinent Vitals/Pain Pain Assessment: 0-10 Pain Score: 3  Pain Location: Lt hip Pain Descriptors / Indicators: Sore Pain Intervention(s): Limited activity within patient's tolerance;Monitored during session    Home Living                      Prior Function            PT Goals (current goals can now be found in the care plan section) Acute Rehab PT Goals Patient Stated Goal: keep getting better PT Goal Formulation: With patient Time For Goal Achievement: 08/01/16 Potential to Achieve Goals: Good Progress towards PT goals: Progressing toward goals    Frequency  Min 3X/week    PT Plan Current plan remains appropriate    Co-evaluation             End of Session Equipment Utilized During Treatment: Gait belt Activity Tolerance: Patient tolerated treatment well;Patient limited by fatigue Patient left: in chair;with call bell/phone within reach;with chair alarm set  Time: 1610-9604 PT Time Calculation (min) (ACUTE ONLY): 23 min  Charges:  $Gait Training: 8-22 mins $Therapeutic Exercise: 8-22 mins                    G Codes:      Christiane Ha, PT, CSCS Pager [phone] Office [phone]  07/20/2016, 8:53 AM

## 2016-07-21 NOTE — Care Management Important Message (Signed)
Important Message  Patient Details  Name: Emily Escobar MRN: [id number] Date of Birth: 05/10/1936   Medicare Important Message Given:  Yes    Daviona Herbert, Stephan MinisterSusan Coleman 07/21/2016, 1:58 PM

## 2016-07-21 NOTE — Progress Notes (Signed)
SW checked [facility] to determine if patient has been given a PASRR number. At this time, [facility] has requested additional information which included a FL2, and [facility].   SW faxed the requested information and a 30 day note to  [facility]. SW is awaiting for PASRR number.  SW called [facility] to inquire if they will be able to take the patient. Carolyn/Admissioins that she will check back in with SW and visit pt for a final decision.  Crista CurbBrittney Kery Batzel, Social Worker 5N [phone]

## 2016-07-21 NOTE — Progress Notes (Signed)
[facility]. Pt alert and oriented x4. VSS. Pain medication given prior to discharge and pt c/o no pain at this time.No signs of respiratory distress. Education complete and care plans resolved. IV removed with catheter intact and pt tolerated well. No further issues at this time. Pt to follow up with PCP. Jillyn HiddenStone,Abisola Carrero R, RN

## 2016-07-21 NOTE — Progress Notes (Signed)
SW spoke with Jasmine DecemberSharon and E[facility]ol[facility]. They confirm that the pt has been accepted to their facility.   SW obtained PASRR: [id number] E. Admissions staff is aware.  SW will give nurse report number for facility.  Nurse Report Number: [id number]   Crista CurbBrittney Johnanthony Wilden, Social Work [phone]

## 2016-07-21 NOTE — Discharge Summary (Signed)
Physician Discharge Summary  Emily Escobar ZOX:[id number] DOB: 01/11/1936 DOA: 07/17/2016  PCP: No PCP Per Patient  Admit date: 07/17/2016 Discharge date: 07/21/2016  Admitted From: HOME Disposition:  SNF.  Recommendations for Outpatient Follow-up:  1. Follow up with PCP in 1-2 weeks 2. Please obtain BMP/CBC in one week 3. Please follow up with orthopedics as recommended.     Discharge Condition:stable.  CODE STATUS:FULL  Diet recommendation: Heart Healthy   Brief/Interim Summary: Emily Escobar Wintersis a 80 y.o.femalewith medical history significant of HTN and frequent falls presenting with hip fracture following a Mechanical fall.   Discharge Diagnoses:  Principal Problem:   Closed left femoral fracture, initial encounter Active Problems:   Essential hypertension   Anxiety and depression  Left femoral fracture; underwent left hp replacement. Pain control. Appreciate orthopedics recommendations.  PT eval, recommending SNF.    Hypertension: well controlled.   Anemia: probably blood loss anemia.  DROPPED from 13 to 10.  Stable around 10.    Mild thrombocytopenia: Discussed with Dr Ophelia CharterYates regarding the platelet count and lovenox Changed the lovenox to 325 mg daily aspirin for the 2 weeks.  Recommend checking cbc in one week.   Depression and anxiety:  Resume home meds.   Discharge Instructions  Discharge Instructions    Diet - low sodium heart healthy    Complete by:  As directed   Discharge instructions    Complete by:  As directed   PLEASE FOLLOW UP WITH ORTHOPEDICS AS RECOMMENDED.  CHECK CBC IN ONE WEEK.   Weight bearing as tolerated    Complete by:  As directed       Medication List    TAKE these medications   ALPRAZolam 0.25 MG tablet Commonly known as:  XANAX Take 0.25 mg by mouth 2 (two) times daily as needed for anxiety.   amLODipine 10 MG tablet Commonly known as:  NORVASC Take 1 tablet by mouth daily.   aspirin 325 MG tablet Take 1  tablet (325 mg total) by mouth daily.   atenolol 50 MG tablet Commonly known as:  TENORMIN Take 1 tablet by mouth daily.   beta carotene w/minerals tablet Take 1 tablet by mouth daily.   busPIRone 5 MG tablet Commonly known as:  BUSPAR Take 1 tablet by mouth at bedtime.   cholecalciferol 1000 units tablet Commonly known as:  VITAMIN D Take 5,000 Units by mouth daily.   fexofenadine 180 MG tablet Commonly known as:  ALLEGRA Take 180 mg by mouth as needed for allergies or rhinitis.   hydrOXYzine 50 MG capsule Commonly known as:  VISTARIL Take 1 capsule by mouth at bedtime as needed (sleep).   mirtazapine 15 MG tablet Commonly known as:  REMERON Take 7.5 mg by mouth at bedtime.   multivitamin with minerals Tabs tablet Take 1 tablet by mouth daily.   oxyCODONE 5 MG immediate release tablet Commonly known as:  Oxy IR/ROXICODONE Take 1-3 tablets (5-15 mg total) by mouth every 4 (four) hours as needed.   sertraline 25 MG tablet Commonly known as:  ZOLOFT Take 1 tablet by mouth daily.      Follow-up Information    Cheral AlmasXu, Naiping Michael, MD Follow up in 2 week(s).   Specialty:  Orthopedic Surgery Why:  For suture removal, For wound re-check Contact information: [address] [place] [id number] [phone]          Allergies  Allergen Reactions  . Codeine     Pt states she has tolerated norco in the  past  . Fentanyl Other (See Comments)    Pt reports having hallucinations.  . Morphine And Related     Patient's son states pt feels "looping" with morphine     Consultations:  Orthopedics.    Procedures/Studies: Pelvis Portable  Result Date: 07/17/2016 CLINICAL DATA:  Post left total hip arthroplasty. EXAM: PORTABLE PELVIS 1-2 VIEWS COMPARISON:  Intraoperative images. FINDINGS: Patient is status post total left hip arthroplasty with screwed in acetabular component. No evidence of acute fractures. Expected postsurgical changes in the soft tissues.  Skin staples are seen. IMPRESSION: Post total left hip arthroplasty without evidence of immediate complications. Electronically Signed   By: Ted Mcalpine M.D.   On: 07/17/2016 18:20   Chest Portable 1 View  Result Date: 07/17/2016 CLINICAL DATA:  Preoperative exam prior to hip fracture repair EXAM: PORTABLE CHEST 1 VIEW COMPARISON:  None in PACs FINDINGS: The lungs are adequately inflated. There is no focal infiltrate. The cardiac silhouette is mildly enlarged. The pulmonary vascularity is normal. There is calcification in the wall of the aortic arch. There is no pleural effusion or pneumothorax. 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 IMPRESSION: Mild cardiomegaly. No significant pulmonary vascular congestion or pulmonary edema. No pneumonia. Electronically Signed   By: David  [place] M.D.   On: 07/17/2016 07:48   Dg C-arm 1-60 Min  Result Date: 07/17/2016 CLINICAL DATA:  Intraoperative exam from left total hip arthroplasty. EXAM: OPERATIVE LEFT HIP (WITH PELVIS IF PERFORMED) TECHNIQUE: Fluoroscopic spot image(s) were submitted for interpretation post-operatively. COMPARISON:  Radiograph 07/17/2016 FINDINGS: Initial fluoroscopic image demonstrates minimally displaced impacted subcapital left femoral neck fracture. Three component left total hip arthroplasty was performed with screwed in acetabular component and long stem femoral component. The left femoral neck has been removed. No evidence of immediate complications. IMPRESSION: Status post total left hip arthroplasty, without evidence of immediate complications. Electronically Signed   By: Ted Mcalpine M.D.   On: 07/17/2016 17:43   Dg Hip Operative Unilat W Or W/o Pelvis Left  Result Date: 07/17/2016 CLINICAL DATA:  Intraoperative exam from left total hip arthroplasty. EXAM: OPERATIVE LEFT HIP (WITH PELVIS IF PERFORMED) TECHNIQUE: Fluoroscopic spot image(s) were submitted for interpretation post-operatively. COMPARISON:  Radiograph  07/17/2016 FINDINGS: Initial fluoroscopic image demonstrates minimally displaced impacted subcapital left femoral neck fracture. Three component left total hip arthroplasty was performed with screwed in acetabular component and long stem femoral component. The left femoral neck has been removed. No evidence of immediate complications. IMPRESSION: Status post total left hip arthroplasty, without evidence of immediate complications. Electronically Signed   By: Ted Mcalpine M.D.   On: 07/17/2016 17:43   Dg Hip Unilat With Pelvis 2-3 Views Left  Result Date: 07/17/2016 CLINICAL DATA:  Missed a step, fell at son's home.  LEFT hip pain. EXAM: DG HIP (WITH OR WITHOUT PELVIS) 2-3V LEFT COMPARISON:  None. FINDINGS: Acute LEFT femoral neck fracture with impaction, slight lateral angulation of the distal bony fragments. No dislocation. No destructive bony lesions. Phleboliths project in the pelvis. Surgical sutures in the abdomen. IMPRESSION: Acute mildly displaced LEFT femoral neck fracture without dislocation. Electronically Signed   By: Awilda Metro M.D.   On: 07/17/2016 00:49       Subjective:  No new complaints.  Discharge Exam: Vitals:   07/21/16 0642 07/21/16 1356  BP: (!) 142/59 130/64  Pulse: 89 72  Resp:  16  Temp: 99.5 F (37.5 C) 97.9 F (36.6 C)   Vitals:   07/20/16 1720 07/20/16 2012 07/21/16  0981 07/21/16 1356  BP: (!) 124/54 (!) 124/52 (!) 142/59 130/64  Pulse: 75 79 89 72  Resp:    16  Temp:  99.5 F (37.5 C) 99.5 F (37.5 C) 97.9 F (36.6 C)  TempSrc:  Oral Oral Oral  SpO2:  93% 95% 100%  Weight:      Height:        General: Pt is alert, awake, not in acute distress Cardiovascular: RRR, S1/S2 +, no rubs, no gallops Respiratory: CTA bilaterally, no wheezing, no rhonchi Abdominal: Soft, NT, ND, bowel sounds + Extremities: no edema, no cyanosis    The results of significant diagnostics from this hospitalization (including imaging, microbiology, ancillary  and laboratory) are listed below for reference.     Microbiology: Recent Results (from the past 240 hour(s))  Surgical PCR screen     Status: None   Collection Time: 07/17/16  4:59 AM  Result Value Ref Range Status   MRSA, PCR NEGATIVE NEGATIVE Final   Staphylococcus aureus NEGATIVE NEGATIVE Final    Comment:        The Xpert SA Assay (FDA approved for NASAL specimens in patients over 82 years of age), is one component of a comprehensive surveillance program.  Test performance has been validated by [facility] for patients greater than or equal to 14 year old. It is not intended to diagnose infection nor to guide or monitor treatment.      Labs: BNP (last 3 results) No results for input(s): BNP in the last 8760 hours. Basic Metabolic Panel:  Recent Labs Lab 07/17/16 0200 07/17/16 2058 07/18/16 0302 07/19/16 0444  NA 139  --  137 138  K 4.2  --  4.2 3.7  CL 107  --  104 104  CO2 24  --  28 28  GLUCOSE 127*  --  129* 123*  BUN 25*  --  14 9  CREATININE 0.75 0.78 0.82 0.69  CALCIUM 9.0  --  8.2* 8.5*   Liver Function Tests: No results for input(s): AST, ALT, ALKPHOS, BILITOT, PROT, ALBUMIN in the last 168 hours. No results for input(s): LIPASE, AMYLASE in the last 168 hours. No results for input(s): AMMONIA in the last 168 hours. CBC:  Recent Labs Lab 07/17/16 0200 07/17/16 2058 07/18/16 0302 07/19/16 0444 07/19/16 1411 07/20/16 0739  WBC 12.1* 9.6 7.1 7.1  --  6.1  NEUTROABS 10.3*  --   --   --   --   --   HGB 13.9 11.7* 10.9* 10.2*  --  10.1*  HCT 41.3 36.6 33.5* 31.5*  --  31.4*  MCV 85.7 89.1 89.1 88.0  --  88.5  PLT 145* 130* 105* 92* 126* 112*   Cardiac Enzymes: No results for input(s): CKTOTAL, CKMB, CKMBINDEX, TROPONINI in the last 168 hours. BNP: Invalid input(s): POCBNP CBG: No results for input(s): GLUCAP in the last 168 hours. D-Dimer No results for input(s): DDIMER in the last 72 hours. Hgb A1c No results for input(s): HGBA1C in the  last 72 hours. Lipid Profile No results for input(s): CHOL, HDL, LDLCALC, TRIG, CHOLHDL, LDLDIRECT in the last 72 hours. Thyroid function studies No results for input(s): TSH, T4TOTAL, T3FREE, THYROIDAB in the last 72 hours.  Invalid input(s): FREET3 Anemia work up No results for input(s): VITAMINB12, FOLATE, FERRITIN, TIBC, IRON, RETICCTPCT in the last 72 hours. Urinalysis No results found for: COLORURINE, APPEARANCEUR, LABSPEC, PHURINE, GLUCOSEU, HGBUR, BILIRUBINUR, KETONESUR, PROTEINUR, UROBILINOGEN, NITRITE, LEUKOCYTESUR Sepsis Labs Invalid input(s): PROCALCITONIN,  WBC,  LACTICIDVEN  Microbiology Recent Results (from the past 240 hour(s))  Surgical PCR screen     Status: None   Collection Time: 07/17/16  4:59 AM  Result Value Ref Range Status   MRSA, PCR NEGATIVE NEGATIVE Final   Staphylococcus aureus NEGATIVE NEGATIVE Final    Comment:        The Xpert SA Assay (FDA approved for NASAL specimens in patients over 75 years of age), is one component of a comprehensive surveillance program.  Test performance has been validated by [facility] for patients greater than or equal to 81 year old. It is not intended to diagnose infection nor to guide or monitor treatment.      Time coordinating discharge: Over 30 minutes  SIGNED:   Kathlen Mody, MD  [facility] 07/21/2016, 4:08 PM Pager   If 7PM-7AM, please contact night-coverage [email or web address] Password [email or web address]

## 2016-07-22 ENCOUNTER — Encounter: Payer: Self-pay | Admitting: Internal Medicine

## 2016-07-22 ENCOUNTER — Other Ambulatory Visit: Payer: Self-pay

## 2016-07-22 ENCOUNTER — Non-Acute Institutional Stay (SKILLED_NURSING_FACILITY): Payer: Medicare Other | Admitting: Internal Medicine

## 2016-07-22 DIAGNOSIS — D62 Acute posthemorrhagic anemia: Secondary | ICD-10-CM

## 2016-07-22 DIAGNOSIS — D696 Thrombocytopenia, unspecified: Secondary | ICD-10-CM | POA: Diagnosis not present

## 2016-07-22 DIAGNOSIS — S7292XA Unspecified fracture of left femur, initial encounter for closed fracture: Secondary | ICD-10-CM

## 2016-07-22 DIAGNOSIS — K5901 Slow transit constipation: Secondary | ICD-10-CM

## 2016-07-22 DIAGNOSIS — F419 Anxiety disorder, unspecified: Secondary | ICD-10-CM

## 2016-07-22 DIAGNOSIS — R2681 Unsteadiness on feet: Secondary | ICD-10-CM

## 2016-07-22 DIAGNOSIS — I10 Essential (primary) hypertension: Secondary | ICD-10-CM | POA: Diagnosis not present

## 2016-07-22 DIAGNOSIS — F329 Major depressive disorder, single episode, unspecified: Secondary | ICD-10-CM

## 2016-07-22 MED ORDER — ALPRAZOLAM 0.25 MG PO TABS: 0.2500 mg | ORAL_TABLET | Freq: Two times a day (BID) | ORAL | 0 refills | Status: DC | PRN

## 2016-07-22 MED ORDER — OXYCODONE HCL 5 MG PO TABS: 5.0000 mg | ORAL_TABLET | ORAL | 0 refills | Status: DC | PRN

## 2016-07-22 NOTE — Progress Notes (Signed)
LOCATION: [facility]  PCP: No PCP Per Patient   Code Status: Full Code  Goals of care: Advanced Directive information Advanced Directives 07/17/2016  Does patient have an advance directive? No       Extended Emergency Contact Information Primary Emergency Contact: Esperanza,Sam Address: [address]          [place], [place] [address] Darden Amber of [place] Home Phone: [phone] Relation: Son Secondary Emergency Contact: Donah Driver Address: [address]          [place], [place] [address] Darden Amber of [place] Phone: [phone] Relation: Other   Allergies  Allergen Reactions  . Codeine     Pt states she has tolerated norco in the past  . Fentanyl Other (See Comments)    Pt reports having hallucinations.  . Morphine And Related     Patient's son states pt feels "looping" with morphine     Chief Complaint  Patient presents with  . New Admit To SNF    New Admission     HPI:  Patient is a 80 y.o. female seen today for short term rehabilitation post hospital admission from 07/17/16-07/21/16 with left closed femoral fracture post fall. She underwent left hip replacement surgery. She is seen in her room today. She would like to come off oxycodone and be on something else for pain management. She complaints of muscle spasm.   Review of Systems:  Constitutional: Negative for fever, chills.  HENT: Negative for headache, congestion, nasal discharge. Eyes: Negative for blurred vision, double vision and discharge.  Respiratory: Negative for cough, shortness of breath and wheezing.   Cardiovascular: Negative for chest pain, palpitations, leg swelling.  Gastrointestinal: Negative for heartburn, nausea, vomiting, abdominal pain. Last bowel movement was before hospitalization. Had regular bowel movement at home Genitourinary: Negative for dysuria and flank pain.  Musculoskeletal: Negative for back pain, fall in the facility.  Skin: Negative for  itching, rash.  Neurological: Negative for dizziness. Psychiatric/Behavioral: Negative for depression   Past Medical History:  Diagnosis Date  . Anxiety and depression   . History of vertebral fracture    from fall in September 2016  . Hypertension   . Insomnia   . Macular degeneration   . Solitary kidney    Past Surgical History:  Procedure Laterality Date  . ABDOMINAL HYSTERECTOMY    . BLADDER SUSPENSION    . BREAST SURGERY    . CATARACT EXTRACTION, BILATERAL    . EYE SURGERY    . GALLBLADDER SURGERY    . JOINT REPLACEMENT    . NASAL SEPTUM SURGERY    . RECONSTRUCTION OF EYELID    . RECTOCELE REPAIR    . TOTAL HIP ARTHROPLASTY Left 07/17/2016   Procedure: TOTAL HIP ARTHROPLASTY ANTERIOR APPROACH;  Surgeon: Tarry Kos, MD;  Location: [facility] OR;  Service: Orthopedics;  Laterality: Left;   Social History:   reports that she has never smoked. She has never used smokeless tobacco. She reports that she drinks about 1.8 - 2.4 oz of alcohol per week . She reports that she does not use drugs.  No family history on file.  Medications:   Medication List       Accurate as of 07/22/16  2:23 PM. Always use your most recent med list.          ALPRAZolam 0.25 MG tablet Commonly known as:  XANAX Take 1 tablet (0.25 mg total) by mouth 2 (two) times daily as needed for anxiety.  amLODipine 10 MG tablet Commonly known as:  NORVASC Take 1 tablet by mouth daily.   aspirin 325 MG tablet Take 1 tablet (325 mg total) by mouth daily.   atenolol 50 MG tablet Commonly known as:  TENORMIN Take 1 tablet by mouth daily.   beta carotene w/minerals tablet Take 1 tablet by mouth daily.   busPIRone 5 MG tablet Commonly known as:  BUSPAR Take 1 tablet by mouth at bedtime.   cholecalciferol 1000 units tablet Commonly known as:  VITAMIN D Take 5,000 Units by mouth daily.   enoxaparin 40 MG/0.4ML injection Commonly known as:  LOVENOX Inject 40 mg into the skin daily. Stop date  08/04/16   fexofenadine 180 MG tablet Commonly known as:  ALLEGRA Take 180 mg by mouth daily.   hydrOXYzine 50 MG capsule Commonly known as:  VISTARIL Take 1 capsule by mouth at bedtime as needed (sleep).   mirtazapine 15 MG tablet Commonly known as:  REMERON Take 7.5 mg by mouth at bedtime.   multivitamin with minerals Tabs tablet Take 1 tablet by mouth daily.   oxyCODONE 5 MG immediate release tablet Commonly known as:  Oxy IR/ROXICODONE Take 1-3 tablets (5-15 mg total) by mouth every 4 (four) hours as needed.   sertraline 25 MG tablet Commonly known as:  ZOLOFT Take 1 tablet by mouth daily.       Immunizations:  There is no immunization history on file for this patient.   Physical Exam:  Vitals:   07/22/16 1417  BP: (!) 117/54  Pulse: 85  Resp: 18  Temp: 97.5 F (36.4 C)  TempSrc: Oral  SpO2: 98%  Weight: 150 lb (68 kg)  Height:  (1.575 m)   Body mass index is 27.44 kg/m.  General- elderly female, well built, in no acute distress Head- normocephalic, atraumatic Nose- no nasal discharge Throat- moist mucus membrane Eyes- PERRLA, EOMI, no pallor, no icterus, no discharge Neck- no cervical lymphadenopathy Cardiovascular- normal s1,s2, no murmur Respiratory- bilateral clear to auscultation, no wheeze, no rhonchi, no crackles, no use of accessory muscles Abdomen- bowel sounds present, soft, non tender Musculoskeletal- able to move all 4 extremities, limited left leg ROM, no leg edema.  Neurological- alert and oriented to person, place and time Skin- warm and dry Psychiatry- normal mood and affect    Labs reviewed: Basic Metabolic Panel:  Recent Labs  16/10/96 0200 07/17/16 2058 07/18/16 0302 07/19/16 0444  NA 139  --  137 138  K 4.2  --  4.2 3.7  CL 107  --  104 104  CO2 24  --  28 28  GLUCOSE 127*  --  129* 123*  BUN 25*  --  14 9  CREATININE 0.75 0.78 0.82 0.69  CALCIUM 9.0  --  8.2* 8.5*    CBC:  Recent Labs  07/17/16 0200   07/18/16 0302 07/19/16 0444 07/19/16 1411 07/20/16 0739  WBC 12.1*  < > 7.1 7.1  --  6.1  NEUTROABS 10.3*  --   --   --   --   --   HGB 13.9  < > 10.9* 10.2*  --  10.1*  HCT 41.3  < > 33.5* 31.5*  --  31.4*  MCV 85.7  < > 89.1 88.0  --  88.5  PLT 145*  < > 105* 92* 126* 112*  < > = values in this interval not displayed.   Radiological Exams: Pelvis Portable  Result Date: 07/17/2016 CLINICAL DATA:  Post left total  hip arthroplasty. EXAM: PORTABLE PELVIS 1-2 VIEWS COMPARISON:  Intraoperative images. FINDINGS: Patient is status post total left hip arthroplasty with screwed in acetabular component. No evidence of acute fractures. Expected postsurgical changes in the soft tissues. Skin staples are seen. IMPRESSION: Post total left hip arthroplasty without evidence of immediate complications. Electronically Signed   By: Ted Mcalpine M.D.   On: 07/17/2016 18:20   Chest Portable 1 View  Result Date: 07/17/2016 CLINICAL DATA:  Preoperative exam prior to hip fracture repair EXAM: PORTABLE CHEST 1 VIEW COMPARISON:  None in PACs FINDINGS: The lungs are adequately inflated. There is no focal infiltrate. The cardiac silhouette is mildly enlarged. The pulmonary vascularity is normal. There is calcification in the wall of the aortic arch. There is no pleural effusion or pneumothorax. 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 IMPRESSION: Mild cardiomegaly. No significant pulmonary vascular congestion or pulmonary edema. No pneumonia. Electronically Signed   By: David  [place] M.D.   On: 07/17/2016 07:48   Dg C-arm 1-60 Min  Result Date: 07/17/2016 CLINICAL DATA:  Intraoperative exam from left total hip arthroplasty. EXAM: OPERATIVE LEFT HIP (WITH PELVIS IF PERFORMED) TECHNIQUE: Fluoroscopic spot image(s) were submitted for interpretation post-operatively. COMPARISON:  Radiograph 07/17/2016 FINDINGS: Initial fluoroscopic image demonstrates minimally displaced impacted subcapital left femoral neck  fracture. Three component left total hip arthroplasty was performed with screwed in acetabular component and long stem femoral component. The left femoral neck has been removed. No evidence of immediate complications. IMPRESSION: Status post total left hip arthroplasty, without evidence of immediate complications. Electronically Signed   By: Ted Mcalpine M.D.   On: 07/17/2016 17:43   Dg Hip Operative Unilat W Or W/o Pelvis Left  Result Date: 07/17/2016 CLINICAL DATA:  Intraoperative exam from left total hip arthroplasty. EXAM: OPERATIVE LEFT HIP (WITH PELVIS IF PERFORMED) TECHNIQUE: Fluoroscopic spot image(s) were submitted for interpretation post-operatively. COMPARISON:  Radiograph 07/17/2016 FINDINGS: Initial fluoroscopic image demonstrates minimally displaced impacted subcapital left femoral neck fracture. Three component left total hip arthroplasty was performed with screwed in acetabular component and long stem femoral component. The left femoral neck has been removed. No evidence of immediate complications. IMPRESSION: Status post total left hip arthroplasty, without evidence of immediate complications. Electronically Signed   By: Ted Mcalpine M.D.   On: 07/17/2016 17:43   Dg Hip Unilat With Pelvis 2-3 Views Left  Result Date: 07/17/2016 CLINICAL DATA:  Missed a step, fell at son's home.  LEFT hip pain. EXAM: DG HIP (WITH OR WITHOUT PELVIS) 2-3V LEFT COMPARISON:  None. FINDINGS: Acute LEFT femoral neck fracture with impaction, slight lateral angulation of the distal bony fragments. No dislocation. No destructive bony lesions. Phleboliths project in the pelvis. Surgical sutures in the abdomen. IMPRESSION: Acute mildly displaced LEFT femoral neck fracture without dislocation. Electronically Signed   By: Awilda Metro M.D.   On: 07/17/2016 00:49    Assessment/Plan  Unsteady gait With left femoral fracture and is s/p surgery. Will have patient work with PT/OT as tolerated to regain  strength and restore function.  Fall precautions are in place.  Left closed femoral fracture S/p left hip replacement surgery. Will have her work with physical therapy and occupational therapy team to help with gait training and muscle strengthening exercises.fall precautions. Skin care. Encourage to be out of bed. Has orthopedic follow up. D/c oxycodone IR. Start tylenol extra strength 1000 mg tid and tramadol 50 mg 1-2 tab q6h prn pain. Continue lovenox daily for dvt prophylaxis.   Blood loss anemia  Post op, likely from blood loss. Monitor cbc.  Thrombocytopenia No bleed reported. Monitor platelet count.   HTN Monitor BP. Continue amlodipine 10 mg daily and atenolol 50 mg daily. Continue aspirin 325 mg daily  Constipation Start senna s 1 tab qhs and miralax daily with prune juice and monitor  Depression Continue buspirone and sertraline with remeron. Monitor clinically  Anxiety Stable, continue alprazolam 0.25 mg bid prn and monitor   Goals of care: short term rehabilitation   Labs/tests ordered: cbc, cmp  Family/ staff Communication: reviewed care plan with patient and nursing supervisor    Oneal GroutMAHIMA Dejion Grillo, MD Internal Medicine [facility] [address] [place], [place] [address] Cell Phone (Monday-Friday 8 am - 5 pm): [phone] On Call: [phone] and follow prompts after 5 pm and on weekends Office Phone: [phone] Office Fax: [phone]

## 2016-07-22 NOTE — Telephone Encounter (Signed)
[facility]

## 2016-07-23 ENCOUNTER — Other Ambulatory Visit: Payer: Self-pay | Admitting: *Deleted

## 2016-07-23 LAB — HEPATIC FUNCTION PANEL
ALK PHOS: 222 U/L — AB (ref 25–125)
ALT: 53 U/L — AB (ref 7–35)
AST: 49 U/L — AB (ref 13–35)
Bilirubin, Total: 0.7 mg/dL

## 2016-07-23 LAB — CBC AND DIFFERENTIAL
HEMATOCRIT: 33 % — AB (ref 36–46)
HEMOGLOBIN: 10.9 g/dL — AB (ref 12.0–16.0)
NEUTROS ABS: 4 /uL
PLATELETS: 225 10*3/uL (ref 150–399)
WBC: 5.7 10*3/mL

## 2016-07-23 LAB — BASIC METABOLIC PANEL
BUN: 16 mg/dL (ref 4–21)
CREATININE: 0.7 mg/dL (ref 0.5–1.1)
Glucose: 107 mg/dL
POTASSIUM: 4.4 mmol/L (ref 3.4–5.3)
Sodium: 143 mmol/L (ref 137–147)

## 2016-07-23 MED ORDER — TRAMADOL HCL 50 MG PO TABS: ORAL_TABLET | ORAL | 0 refills | Status: DC

## 2016-07-23 NOTE — Telephone Encounter (Signed)
[facility] #[phone] Fax: [phone] 

## 2016-08-08 ENCOUNTER — Non-Acute Institutional Stay (SKILLED_NURSING_FACILITY): Payer: Medicare Other | Admitting: Adult Health

## 2016-08-08 ENCOUNTER — Encounter: Payer: Self-pay | Admitting: Adult Health

## 2016-08-08 DIAGNOSIS — F329 Major depressive disorder, single episode, unspecified: Secondary | ICD-10-CM | POA: Diagnosis not present

## 2016-08-08 DIAGNOSIS — S7292XA Unspecified fracture of left femur, initial encounter for closed fracture: Secondary | ICD-10-CM | POA: Diagnosis not present

## 2016-08-08 DIAGNOSIS — R2681 Unsteadiness on feet: Secondary | ICD-10-CM | POA: Diagnosis not present

## 2016-08-08 DIAGNOSIS — K5901 Slow transit constipation: Secondary | ICD-10-CM

## 2016-08-08 DIAGNOSIS — D62 Acute posthemorrhagic anemia: Secondary | ICD-10-CM

## 2016-08-08 DIAGNOSIS — F419 Anxiety disorder, unspecified: Secondary | ICD-10-CM

## 2016-08-08 DIAGNOSIS — I10 Essential (primary) hypertension: Secondary | ICD-10-CM

## 2016-08-08 DIAGNOSIS — J309 Allergic rhinitis, unspecified: Secondary | ICD-10-CM

## 2016-08-08 NOTE — Progress Notes (Signed)
Patient ID: Emily Escobar, female   DOB: [birth year], 80 y.o.   MRN: [id number]    DATE:  08/08/2016   MRN:  [id number]  BIRTHDAY: 08-03-1936  Facility:  Nursing Home Location:  [facility]  Nursing Home Room Number: 104-P  LEVEL OF CARE:  SNF [phone])  Contact Information    Name Relation Home Work [place] Son [phone]     Emily Escobar, Emily Escobar   [phone]   Emily Escobar,Emily Escobar Daughter [phone]  [phone]       Code Status History    Date Active Date Inactive Code Status Order ID Comments User Context   07/17/2016  4:10 AM 07/21/2016  8:05 PM Full Code [id number]  Jonah Blue, MD Inpatient       Chief Complaint  Patient presents with  . Discharge Note    HISTORY OF PRESENT ILLNESS:   This is a 80 year old female who is for discharge home with Home health PT and OT. DME:  Rolling walker and bedside commode.  She has been admitted [facility]16 from [facility] for short-term rehabilitation post hospitalization  From 07/17/16 - 07/21/16. She fell and sustained a left closed femoral fracture for which she had left total hip replacement on 07/17/16.  Patient was admitted to this facility for short-term rehabilitation after the patient's recent hospitalization.  Patient has completed SNF rehabilitation and therapy has cleared the patient for discharge.   PAST MEDICAL HISTORY:  Past Medical History:  Diagnosis Date  . Anxiety and depression   . Closed left femoral fracture (HCC)   . History of vertebral fracture    from fall in September 2016  . Hypertension   . Insomnia   . Macular degeneration   . Solitary kidney      CURRENT MEDICATIONS: Reviewed  Patient's Medications  New Prescriptions   No medications on file  Previous Medications   ACETAMINOPHEN (TYLENOL) 500 MG TABLET    Take 1,000 mg by mouth every 8 (eight) hours as needed.   ALPRAZOLAM (XANAX) 0.25 MG TABLET    Take 1 tablet (0.25 mg total) by mouth 2  (two) times daily as needed for anxiety.   AMLODIPINE (NORVASC) 10 MG TABLET    Take 1 tablet by mouth daily.   ASPIRIN 325 MG TABLET    Take 325 mg by mouth 2 (two) times daily.   ATENOLOL (TENORMIN) 50 MG TABLET    Take 1 tablet by mouth daily.   BETA CAROTENE W/MINERALS (OCUVITE) TABLET    Take 1 tablet by mouth daily.   BUSPIRONE (BUSPAR) 5 MG TABLET    Take 1 tablet by mouth at bedtime.    CHOLECALCIFEROL (VITAMIN D) 1000 UNITS TABLET    Take 5,000 Units by mouth daily.   FEXOFENADINE (ALLEGRA) 180 MG TABLET    Take 180 mg by mouth daily.    HYDROXYZINE (VISTARIL) 50 MG CAPSULE    Take 1 capsule by mouth at bedtime as needed (sleep).    METHOCARBAMOL (ROBAXIN) 500 MG TABLET    Take 500 mg by mouth every 12 (twelve) hours as needed for muscle spasms.   MIRTAZAPINE (REMERON) 15 MG TABLET    Take 7.5 mg by mouth at bedtime.    MULTIPLE VITAMIN (MULTIVITAMIN WITH MINERALS) TABS TABLET    Take 1 tablet by mouth daily.   POLYETHYLENE GLYCOL (MIRALAX / GLYCOLAX) PACKET    Take 17 g by mouth daily.   SENNA-DOCUSATE (SENOKOT-S) 8.6-50 MG TABLET  Take 1 tablet by mouth at bedtime.   SERTRALINE (ZOLOFT) 25 MG TABLET    Take 1 tablet by mouth daily.   TRAMADOL (ULTRAM) 50 MG TABLET    Take one tablet by mouth every 6 hours as needed for moderate pain; Take two tablets by mouth every 6 hours as needed for severe pain  Modified Medications   No medications on file  Discontinued Medications   ASPIRIN 325 MG TABLET    Take 1 tablet (325 mg total) by mouth daily.   ENOXAPARIN (LOVENOX) 40 MG/0.4ML INJECTION    Inject 40 mg into the skin daily. Stop date 08/04/16   OXYCODONE (OXY IR/ROXICODONE) 5 MG IMMEDIATE RELEASE TABLET    Take 1-3 tablets (5-15 mg total) by mouth every 4 (four) hours as needed.     Allergies  Allergen Reactions  . Codeine     Pt states she has tolerated norco in the past  . Fentanyl Other (See Comments)    Pt reports having hallucinations.  . Morphine And Related      Patient's son states pt feels "looping" with morphine      REVIEW OF SYSTEMS:  GENERAL: no change in appetite, no fatigue, no weight changes, no fever, chills or weakness EYES: Denies change in vision, dry eyes, eye pain, itching or discharge EARS: Denies change in hearing, ringing in ears, or earache NOSE: Denies nasal congestion or epistaxis MOUTH and THROAT: Denies oral discomfort, gingival pain or bleeding, pain from teeth or hoarseness   RESPIRATORY: no cough, SOB, DOE, wheezing, hemoptysis CARDIAC: no chest pain, edema or palpitations GI: no abdominal pain, diarrhea, constipation, heart burn, nausea or vomiting GU: Denies dysuria, frequency, hematuria, incontinence, or discharge PSYCHIATRIC: Denies feeling of depression or anxiety. No report of hallucinations, insomnia, paranoia, or agitation    PHYSICAL EXAMINATION  GENERAL APPEARANCE: Well nourished. In no acute distress. Normal body habitus SKIN:  Left hip surgical incision is healed with 1 steri-strip still intact on the top portion, no erythema HEAD: Normal in size and contour. No evidence of trauma EYES: Lids open and close normally. No blepharitis, entropion or ectropion. PERRL. Conjunctivae are clear and sclerae are white. Lenses are without opacity EARS: Pinnae are normal. Patient hears normal voice tunes of the examiner MOUTH and THROAT: Lips are without lesions. Oral mucosa is moist and without lesions. Tongue is normal in shape, size, and color and without lesions NECK: supple, trachea midline, no neck masses, no thyroid tenderness, no thyromegaly LYMPHATICS: no LAN in the neck, no supraclavicular LAN RESPIRATORY: breathing is even & unlabored, BS CTAB CARDIAC: RRR, no murmur,no extra heart sounds, no edema GI: abdomen soft, normal BS, no masses, no tenderness, no hepatomegaly, no splenomegaly EXTREMITIES:  Able to move X 4 extremities PSYCHIATRIC: Alert and oriented X 3. Affect and behavior are  appropriate  LABS/RADIOLOGY: Labs reviewed: Basic Metabolic Panel:  Recent Labs  21/30/8607/03/01 0200  07/18/16 0302 07/19/16 0444 07/23/16 1136  NA 139  --  137 138 143  K 4.2  --  4.2 3.7 4.4  CL 107  --  104 104  --   CO2 24  --  28 28  --   GLUCOSE 127*  --  129* 123*  --   BUN 25*  --  14 9 16   CREATININE 0.75  < > 0.82 0.69 0.7  CALCIUM 9.0  --  8.2* 8.5*  --   < > = values in this interval not displayed. Liver Function Tests:  Recent  Labs  07/23/16 1136  AST 49*  ALT 53*  ALKPHOS 222*   CBC:  Recent Labs  07/17/16 0200  07/18/16 0302 07/19/16 0444 07/19/16 1411 07/20/16 0739 07/23/16 1136  WBC 12.1*  < > 7.1 7.1  --  6.1 5.7  NEUTROABS 10.3*  --   --   --   --   --  4  HGB 13.9  < > 10.9* 10.2*  --  10.1* 10.9*  HCT 41.3  < > 33.5* 31.5*  --  31.4* 33*  MCV 85.7  < > 89.1 88.0  --  88.5  --   PLT 145*  < > 105* 92* 126* 112* 225  < > = values in this interval not displayed.    Pelvis Portable  Result Date: 07/17/2016 CLINICAL DATA:  Post left total hip arthroplasty. EXAM: PORTABLE PELVIS 1-2 VIEWS COMPARISON:  Intraoperative images. FINDINGS: Patient is status post total left hip arthroplasty with screwed in acetabular component. No evidence of acute fractures. Expected postsurgical changes in the soft tissues. Skin staples are seen. IMPRESSION: Post total left hip arthroplasty without evidence of immediate complications. Electronically Signed   By: Ted Mcalpine M.D.   On: 07/17/2016 18:20   Chest Portable 1 View  Result Date: 07/17/2016 CLINICAL DATA:  Preoperative exam prior to hip fracture repair EXAM: PORTABLE CHEST 1 VIEW COMPARISON:  None in PACs FINDINGS: The lungs are adequately inflated. There is no focal infiltrate. The cardiac silhouette is mildly enlarged. The pulmonary vascularity is normal. There is calcification in the wall of the aortic arch. There is no pleural effusion or pneumothorax. 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  IMPRESSION: Mild cardiomegaly. No significant pulmonary vascular congestion or pulmonary edema. No pneumonia. Electronically Signed   By: David  [place] M.D.   On: 07/17/2016 07:48   Dg C-arm 1-60 Min  Result Date: 07/17/2016 CLINICAL DATA:  Intraoperative exam from left total hip arthroplasty. EXAM: OPERATIVE LEFT HIP (WITH PELVIS IF PERFORMED) TECHNIQUE: Fluoroscopic spot image(s) were submitted for interpretation post-operatively. COMPARISON:  Radiograph 07/17/2016 FINDINGS: Initial fluoroscopic image demonstrates minimally displaced impacted subcapital left femoral neck fracture. Three component left total hip arthroplasty was performed with screwed in acetabular component and long stem femoral component. The left femoral neck has been removed. No evidence of immediate complications. IMPRESSION: Status post total left hip arthroplasty, without evidence of immediate complications. Electronically Signed   By: Ted Mcalpine M.D.   On: 07/17/2016 17:43   Dg Hip Operative Unilat W Or W/o Pelvis Left  Result Date: 07/17/2016 CLINICAL DATA:  Intraoperative exam from left total hip arthroplasty. EXAM: OPERATIVE LEFT HIP (WITH PELVIS IF PERFORMED) TECHNIQUE: Fluoroscopic spot image(s) were submitted for interpretation post-operatively. COMPARISON:  Radiograph 07/17/2016 FINDINGS: Initial fluoroscopic image demonstrates minimally displaced impacted subcapital left femoral neck fracture. Three component left total hip arthroplasty was performed with screwed in acetabular component and long stem femoral component. The left femoral neck has been removed. No evidence of immediate complications. IMPRESSION: Status post total left hip arthroplasty, without evidence of immediate complications. Electronically Signed   By: Ted Mcalpine M.D.   On: 07/17/2016 17:43   Dg Hip Unilat With Pelvis 2-3 Views Left  Result Date: 07/17/2016 CLINICAL DATA:  Missed a step, fell at son's home.  LEFT hip pain. EXAM: DG HIP  (WITH OR WITHOUT PELVIS) 2-3V LEFT COMPARISON:  None. FINDINGS: Acute LEFT femoral neck fracture with impaction, slight lateral angulation of the distal bony fragments. No dislocation. No destructive bony lesions.  Phleboliths project in the pelvis. Surgical sutures in the abdomen. IMPRESSION: Acute mildly displaced LEFT femoral neck fracture without dislocation. Electronically Signed   By: Awilda Metro M.D.   On: 07/17/2016 00:49    ASSESSMENT/PLAN:  Unsteady gait - for home health PT and OT/therapeutic strengthening exercises; fall precaution  Left closed femoral fracture S/P Left total hip replacement - for Home health PT and OT//therapeutic strengthening exercises;  continue ASA EC 325 mg 1 tab PO BID for DVT prophylaxis; Robaxin 500 mg 1 tab PO Q 12 hours PRN for muscle spasm; Acetaminophen 500 mg take 2 tabs = 1,000 mg PO TID and Tramadol 50 mg 1-2 tabs by mouth every 6 hours when necessary for pain; follow-up with orthopedic surgeon, Dr. Roda Shutters on 08/29/2016  Anxiety - mood is stable; continue Xanax 0.25 mg 1 tab by mouth twice a day when necessary  Allergic rhinitis - continue Allegra 180 mg 1 capsule by mouth daily  Hypertension - well controlled; continue Norvasc 10 mg 1 tab by mouth daily and Tenormin 50 mg 1 tab by mouth daily  Chronic Depression - continue Zoloft 25 mg 1 tab by mouth daily, Remeron 15 mg 1/2 tab= 7.5 mg by mouth daily at bedtime and Buspirone 5 mg 1 tab by mouth daily at bedtime  Constipation - continue MiraLAX 17 g by mouth daily and senna S 1 tab by mouth daily at bedtime  Anemia, acute blood loss - stable Lab Results  Component Value Date   HGB 10.9 (A) 07/23/2016       I have filled out patient's discharge paperwork and written prescriptions.  Patient will receive home health PT and OT.  DME provided:  Rolling walker and Bedside Commode   Total discharge time: Greater than 30 minutes Greater than 50% was spent in counseling and coordination of care  with the patient.    Discharge time involved coordination of the discharge process with social worker, nursing staff and therapy department. Medical justification for home health services/DME verified.    Kenard Gower, NP [employer] [phone]

## 2016-09-08 ENCOUNTER — Other Ambulatory Visit: Payer: Self-pay | Admitting: Adult Health

## 2020-08-30 ENCOUNTER — Other Ambulatory Visit: Payer: Self-pay

## 2020-08-30 ENCOUNTER — Encounter (INDEPENDENT_AMBULATORY_CARE_PROVIDER_SITE_OTHER): Payer: Self-pay | Admitting: Ophthalmology

## 2020-08-30 ENCOUNTER — Ambulatory Visit (INDEPENDENT_AMBULATORY_CARE_PROVIDER_SITE_OTHER): Payer: Medicare Other | Admitting: Ophthalmology

## 2020-08-30 DIAGNOSIS — H353132 Nonexudative age-related macular degeneration, bilateral, intermediate dry stage: Secondary | ICD-10-CM

## 2020-08-30 DIAGNOSIS — H353211 Exudative age-related macular degeneration, right eye, with active choroidal neovascularization: Secondary | ICD-10-CM | POA: Diagnosis not present

## 2020-08-30 MED ORDER — BEVACIZUMAB CHEMO INJECTION 1.25MG/0.05ML SYRINGE FOR KALEIDOSCOPE
1.2500 mg | INTRAVITREAL | Status: AC | PRN
  Administered 2020-08-30: 1.25 mg via INTRAVITREAL

## 2020-08-30 NOTE — Progress Notes (Signed)
08/30/2020     CHIEF COMPLAINT Patient presents for Retina Follow Up   HISTORY OF PRESENT ILLNESS: Emily Escobar is a 84 y.o. female who presents to the clinic today for:   HPI    Retina Follow Up    Patient presents with  Wet AMD.  In both eyes.  Severity is moderate.  Since onset it is stable.  I, the attending physician,  performed the HPI with the patient and updated documentation appropriately.          Comments    Pt has moved back from [place]. Last seen by [facility] in 2017. Pt c/o decrease in OU vision. Pt is being treated for Wet AMD with Avastin OD. Last inj was 07/23/2020. No recent inj for OS.       Last edited by Elyse Jarvis on 08/30/2020  9:29 AM. (History)      Referring physician: No referring provider defined for this encounter.  HISTORICAL INFORMATION:   Selected notes from the MEDICAL RECORD NUMBER       CURRENT MEDICATIONS: No current outpatient medications on file. (Ophthalmic Drugs)   No current facility-administered medications for this visit. (Ophthalmic Drugs)   Current Outpatient Medications (Other)  Medication Sig  . acetaminophen (TYLENOL) 500 MG tablet Take 1,000 mg by mouth every 8 (eight) hours as needed.  . ALPRAZolam (XANAX) 0.25 MG tablet Take 1 tablet (0.25 mg total) by mouth 2 (two) times daily as needed for anxiety.  Marland Kitchen amLODipine (NORVASC) 10 MG tablet Take 1 tablet by mouth daily.  Marland Kitchen aspirin 325 MG tablet Take 325 mg by mouth 2 (two) times daily.  Marland Kitchen atenolol (TENORMIN) 50 MG tablet Take 1 tablet by mouth daily.  . beta carotene w/minerals (OCUVITE) tablet Take 1 tablet by mouth daily.  . busPIRone (BUSPAR) 5 MG tablet Take 1 tablet by mouth at bedtime.   . cholecalciferol (VITAMIN D) 1000 units tablet Take 5,000 Units by mouth daily.  . fexofenadine (ALLEGRA) 180 MG tablet Take 180 mg by mouth daily.   . hydrOXYzine (VISTARIL) 50 MG capsule Take 1 capsule by mouth at bedtime as needed (sleep).   . methocarbamol (ROBAXIN) 500 MG  tablet Take 500 mg by mouth every 12 (twelve) hours as needed for muscle spasms.  . mirtazapine (REMERON) 15 MG tablet Take 7.5 mg by mouth at bedtime.   . Multiple Vitamin (MULTIVITAMIN WITH MINERALS) TABS tablet Take 1 tablet by mouth daily.  . polyethylene glycol (MIRALAX / GLYCOLAX) packet Take 17 g by mouth daily.  Marland Kitchen senna-docusate (SENOKOT-S) 8.6-50 MG tablet Take 1 tablet by mouth at bedtime.  . sertraline (ZOLOFT) 25 MG tablet Take 1 tablet by mouth daily.  . traMADol (ULTRAM) 50 MG tablet Take one tablet by mouth every 6 hours as needed for moderate pain; Take two tablets by mouth every 6 hours as needed for severe pain   No current facility-administered medications for this visit. (Other)      REVIEW OF SYSTEMS:    ALLERGIES Allergies  Allergen Reactions  . Codeine     Pt states she has tolerated norco in the past  . Fentanyl Other (See Comments)    Pt reports having hallucinations.  . Morphine And Related     Patient's son states pt feels "looping" with morphine     PAST MEDICAL HISTORY Past Medical History:  Diagnosis Date  . Anxiety and depression   . Closed left femoral fracture (HCC)   . History of vertebral fracture  from fall in September 2016  . Hypertension   . Insomnia   . Macular degeneration   . Solitary kidney    Past Surgical History:  Procedure Laterality Date  . ABDOMINAL HYSTERECTOMY    . BLADDER SUSPENSION    . BREAST SURGERY    . CATARACT EXTRACTION, BILATERAL    . EYE SURGERY    . GALLBLADDER SURGERY    . JOINT REPLACEMENT    . NASAL SEPTUM SURGERY    . RECONSTRUCTION OF EYELID    . RECTOCELE REPAIR    . TOTAL HIP ARTHROPLASTY Left 07/17/2016   Procedure: TOTAL HIP ARTHROPLASTY ANTERIOR APPROACH;  Surgeon: Tarry Kos, MD;  Location: [facility] OR;  Service: Orthopedics;  Laterality: Left;    FAMILY HISTORY History reviewed. No pertinent family history.  SOCIAL HISTORY Social History   Tobacco Use  . Smoking status: Never Smoker    . Smokeless tobacco: Never Used  Substance Use Topics  . Alcohol use: Yes    Alcohol/week: 3.0 - 4.0 standard drinks    Types: 3 - 4 Glasses of wine per week  . Drug use: No         OPHTHALMIC EXAM: Base Eye Exam    Visual Acuity (Snellen - Linear)      Right Left   Dist cc 20/60 20/40   Dist ph cc NI NI   Correction: Glasses       Tonometry (Tonopen, 9:36 AM)      Right Left   Pressure 15 16       Pupils      Pupils Dark Light Shape React APD   Right PERRL 4 3 Round Brisk None   Left PERRL 4 3 Round Brisk None       Visual Fields (Counting fingers)      Left Right    Full Full       Neuro/Psych    Oriented x3: Yes   Mood/Affect: Normal       Dilation    Both eyes: 1.0% Mydriacyl, 2.5% Phenylephrine @ 9:36 AM        Slit Lamp and Fundus Exam    External Exam      Right Left   External Normal Normal       Slit Lamp Exam      Right Left   Lids/Lashes Normal Normal   Conjunctiva/Sclera White and quiet White and quiet   Cornea Clear Clear   Anterior Chamber Deep and quiet Deep and quiet   Iris Round and reactive Round and reactive   Lens Centered posterior chamber intraocular lens Centered posterior chamber intraocular lens   Vitreous Normal Normal          IMAGING AND PROCEDURES  Imaging and Procedures for 08/30/20  OCT, Retina - OU - Both Eyes       Right Eye Quality was good. Scan locations included subfoveal. Central Foveal Thickness: 295. Progression has been stable. Findings include subretinal fluid, subretinal hyper-reflective material.   Left Eye Quality was good. Scan locations included subfoveal. Central Foveal Thickness: 270. Progression has been stable.   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  Very similar to appearance is from 4 years ago.  Patient has been treated elsewhere [facility]) over that ensuing time.  From that encounters I conclude that this has been a chronic active disease stabilized and  maintained on intravitreal Avastin       Intravitreal Injection, Pharmacologic Agent - OD - Right  Eye       Time Out 08/30/2020. 10:23 AM. Confirmed correct patient, procedure, site, and patient consented.   Anesthesia Topical anesthesia was used.   Procedure Preparation included Tobramycin 0.3%, 10% betadine to eyelids, 5% betadine to ocular surface. A supplied needle was used.   Injection:  1.25 mg Bevacizumab (AVASTIN) SOLN   NDC: [address]-4, Lot: [id number]   Route: Intravitreal, Site: Right Eye, Waste: 0 mg  Post-op Post injection exam found visual acuity of at least counting fingers. The patient tolerated the procedure well. There were no complications. The patient received written and verbal post procedure care education. Post injection medications were not given.                 ASSESSMENT/PLAN:  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 choroidal neovascularization (HCC) The nature of wet macular degeneration was discussed with the patient.  Forms of therapy reviewed include the use of Anti-VEGF medications injected painlessly into the eye, as well as other possible treatment modalities, including thermal laser therapy. Fellow eye involvement and risks were discussed with the patient. Upon the finding of wet age related macular degeneration, treatment will be offered. The treatment regimen is on a treat as needed basis with the intent to treat if necessary and extend interval of exams when possible. On average 1 out of 6 patients do not need lifetime therapy. However, the risk of recurrent disease is high for a lifetime.  Initially monthly, then periodic, examinations and evaluations will determine whether the next treatment is required on the day of the examination.  Intermediate stage nonexudative age-related macular degeneration of both eyes The nature of age--related macular degeneration was discussed with the patient as well as the  distinction between dry and wet types. Checking an Amsler Grid daily with advice to return immediately should a distortion develop, was given to the patient. The patient 's smoking status now and in the past was determined and advice based on the AREDS study was provided regarding the consumption of antioxidant supplements. AREDS 2 vitamin formulation was recommended. Consumption of dark leafy vegetables and fresh fruits of various colors was recommended. Treatment modalities for wet macular degeneration particularly the use of intravitreal injections of anti-blood vessel growth factors was discussed with the patient. Avastin, Lucentis, and Eylea are the available options. On occasion, therapy includes the use of photodynamic therapy and thermal laser. Stressed to the patient do not rub eyes.  Patient was advised to check Amsler Grid daily and return immediately if changes are noted. Instructions on using the grid were given to the patient. All patient questions were answered.      ICD-10-CM   1. 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 choroidal neovascularization (HCC)  H35.3211 OCT, Retina - OU - Both Eyes    Intravitreal Injection, Pharmacologic Agent - OD - Right Eye    Bevacizumab (AVASTIN) SOLN 1.25 mg  2. Intermediate stage nonexudative age-related macular degeneration of both eyes  H35.3132     1.  OD, will resume intravitreal Avastin, as used in her previous eye care in [place].  2.  Examination OD in 5 weeks  3.  Ophthalmic Meds Ordered this visit:  Meds ordered this encounter  Medications  . Bevacizumab (AVASTIN) SOLN 1.25 mg       Return in about 5 weeks (around 10/04/2020) for dilate, OD, AVASTIN OCT.  There are no Patient Instructions on file for this visit.   Explained the diagnoses, plan, and follow  up with the patient and they expressed understanding.  Patient expressed understanding of the importance of proper follow up care.   Alford HighlandGary A.  Aiyonna Lucado M.D. Diseases & Surgery of the Retina and Vitreous [facility] 08/30/20     Abbreviations: M myopia (nearsighted); A astigmatism; H hyperopia (farsighted); P presbyopia; Mrx spectacle prescription;  CTL contact lenses; OD right eye; OS left eye; OU both eyes  XT exotropia; ET esotropia; PEK punctate epithelial keratitis; PEE punctate epithelial erosions; DES dry eye syndrome; MGD meibomian gland dysfunction; ATs artificial tears; PFAT's preservative free artificial tears; NSC nuclear sclerotic cataract; PSC posterior subcapsular cataract; ERM epi-retinal membrane; PVD posterior vitreous detachment; RD retinal detachment; DM diabetes mellitus; DR diabetic retinopathy; NPDR non-proliferative diabetic retinopathy; PDR proliferative diabetic retinopathy; CSME clinically significant macular edema; DME diabetic macular edema; dbh dot blot hemorrhages; CWS cotton wool spot; POAG primary open angle glaucoma; C/D cup-to-disc ratio; HVF humphrey visual field; GVF goldmann visual field; OCT optical coherence tomography; IOP intraocular pressure; BRVO Branch retinal vein occlusion; CRVO central retinal vein occlusion; CRAO central retinal artery occlusion; BRAO branch retinal artery occlusion; RT retinal tear; SB scleral buckle; PPV pars plana vitrectomy; VH Vitreous hemorrhage; PRP panretinal laser photocoagulation; IVK intravitreal kenalog; VMT vitreomacular traction; MH Macular hole;  NVD neovascularization of the disc; NVE neovascularization elsewhere; AREDS age related eye disease study; ARMD age related macular degeneration; POAG primary open angle glaucoma; EBMD epithelial/anterior basement membrane dystrophy; ACIOL anterior chamber intraocular lens; IOL intraocular lens; PCIOL posterior chamber intraocular lens; Phaco/IOL phacoemulsification with intraocular lens placement; PRK photorefractive keratectomy; LASIK laser assisted in situ keratomileusis; HTN hypertension; DM diabetes  mellitus; COPD chronic obstructive pulmonary disease

## 2020-08-30 NOTE — Assessment & Plan Note (Signed)

## 2020-08-30 NOTE — Patient Instructions (Signed)
Instructed to contact the office promptly if new visual acuity distortions or declines occur

## 2020-08-30 NOTE — Assessment & Plan Note (Signed)

## 2020-09-04 DIAGNOSIS — R0600 Dyspnea, unspecified: Secondary | ICD-10-CM | POA: Insufficient documentation

## 2020-09-04 DIAGNOSIS — I209 Angina pectoris, unspecified: Secondary | ICD-10-CM | POA: Insufficient documentation

## 2020-09-04 DIAGNOSIS — R0609 Other forms of dyspnea: Secondary | ICD-10-CM | POA: Insufficient documentation

## 2020-09-04 NOTE — Progress Notes (Signed)
Patient referred by Margot Ables* for angina, dyspnea  Subjective:   Emily Escobar, female    DOB: 01-26-1936, 84 y.o.   MRN: [id number]   Chief Complaint  Patient presents with  . Coronary Artery Disease  . Shortness of Breath  . New Patient (Initial Visit)     HPI  84 y.o. Caucasian female with hypertension, hyperlipidemia, mitral valve prolapse, depression, exertional dyspnea  Per PCP note, patient was noted to have significant coronary calcification on CT scan and no apparent ischemia on nuclear imaging, during her prior work-up in [place]. I do not have the records available to me.  Patient recently moved from [facility] to [facility], now has moved into an independent living facility.  She is here with her daughter today.  Reportedly, patient has had recurrent falls.  Onset fall that hospitalization in [place] about a year ago.  She had a stress test which was reportedly normal.  For last 3 to 4 months, she has had exertional dyspnea.  She has seen a cardiologist in [place] who performed an echocardiogram.  Records not available to me.  She denies chest pain, orthopnea, PND, has trace pedal edema.  Blood pressure elevated today, according to the patient, it is much lower at home.  She reports episodes of lightheadedness, but denies any presyncope or syncope.  Past Medical History:  Diagnosis Date  . Anxiety and depression   . Closed left femoral fracture (HCC)   . History of vertebral fracture    from fall in September 2016  . Hypertension   . Insomnia   . Macular degeneration   . Solitary kidney      Past Surgical History:  Procedure Laterality Date  . ABDOMINAL HYSTERECTOMY    . BLADDER SUSPENSION    . BREAST SURGERY    . CATARACT EXTRACTION, BILATERAL    . EYE SURGERY    . GALLBLADDER SURGERY    . JOINT REPLACEMENT    . NASAL SEPTUM SURGERY    . RECONSTRUCTION OF EYELID    . RECTOCELE REPAIR    . TOTAL HIP ARTHROPLASTY  Left 07/17/2016   Procedure: TOTAL HIP ARTHROPLASTY ANTERIOR APPROACH;  Surgeon: Tarry Kos, MD;  Location: [facility] OR;  Service: Orthopedics;  Laterality: Left;     Social History   Tobacco Use  Smoking Status Never Smoker  Smokeless Tobacco Never Used    Social History   Substance and Sexual Activity  Alcohol Use Yes  . Alcohol/week: 3.0 - 4.0 standard drinks  . Types: 3 - 4 Glasses of wine per week     Family History  Problem Relation Age of Onset  . Heart disease Mother   . Heart attack Father   . Heart disease Brother   . Heart disease Brother      Current Outpatient Medications on File Prior to Visit  Medication Sig Dispense Refill  . acetaminophen (TYLENOL) 500 MG tablet Take 1,000 mg by mouth every 8 (eight) hours as needed.    . ALPRAZolam (XANAX) 0.25 MG tablet Take 1 tablet (0.25 mg total) by mouth 2 (two) times daily as needed for anxiety. 60 tablet 0  . amLODipine (NORVASC) 10 MG tablet Take 1 tablet by mouth daily.    Marland Kitchen atenolol (TENORMIN) 50 MG tablet Take 1 tablet by mouth daily.    Marland Kitchen atorvastatin (LIPITOR) 20 MG tablet Take 20 mg by mouth daily.    . busPIRone (BUSPAR) 5 MG tablet Take 1 tablet by mouth  at bedtime.     Marland Kitchen CLARITIN 10 MG tablet Take 10 mg by mouth every morning.    Marland Kitchen FLUoxetine (PROZAC) 10 MG capsule Take 1 capsule by mouth daily.    . mirtazapine (REMERON) 15 MG tablet Take 7.5 mg by mouth at bedtime.     . Multiple Vitamin (MULTIVITAMIN WITH MINERALS) TABS tablet Take 1 tablet by mouth daily.    . sertraline (ZOLOFT) 25 MG tablet Take 1 tablet by mouth daily.    . traMADol (ULTRAM) 50 MG tablet Take one tablet by mouth every 6 hours as needed for moderate pain; Take two tablets by mouth every 6 hours as needed for severe pain 240 tablet 0  . WELLBUTRIN SR 150 MG 12 hr tablet Take 150 mg by mouth daily.     No current facility-administered medications on file prior to visit.    Cardiovascular and other pertinent studies:  EKG  09/05/2020: Probable sinus rhythm 62 bpm Possible old anteroseptal infarct Poor R wave progression Nonspecific T-abnormality.     Recent labs: Not available   Review of Systems  Cardiovascular: Positive for dyspnea on exertion. Negative for chest pain, leg swelling, palpitations and syncope.         Vitals:   09/05/20 1510  BP: (!) 149/82  Pulse: 63  Resp: 16  SpO2: 96%     Body mass index is 32.92 kg/m. Filed Weights   09/05/20 1510  Weight: 180 lb (81.6 kg)     Objective:   Physical Exam Vitals and nursing note reviewed.  Constitutional:      General: She is not in acute distress. Neck:     Vascular: No JVD.  Cardiovascular:     Rate and Rhythm: Normal rate and regular rhythm.     Heart sounds: Normal heart sounds. No murmur heard.   Pulmonary:     Effort: Pulmonary effort is normal.     Breath sounds: Normal breath sounds. No wheezing or rales.          Assessment & Recommendations:   84 y.o. Caucasian female with hypertension, hyperlipidemia, mitral valve prolapse, depression, exertional dyspnea  Exertional dyspnea: Likely multifactorial. Physical exam and EKG fairly unremarkable. I will obtain an echocardiogram, and get prior records from [place]. With her falls, Im reluctant to empirically start her on diuretic therapy. Continue current medical therapy.    Thank you for referring the patient to [place]. Please feel free to contact with any questions.   Elder Negus, MD Pager: [phone] Office: [phone]

## 2020-09-05 ENCOUNTER — Other Ambulatory Visit: Payer: Self-pay

## 2020-09-05 ENCOUNTER — Ambulatory Visit: Payer: Medicare Other | Admitting: Cardiology

## 2020-09-05 ENCOUNTER — Encounter: Payer: Self-pay | Admitting: Cardiology

## 2020-09-05 VITALS — BP 149/82 | HR 63 | Resp 16 | Ht 62.0 in | Wt 180.0 lb

## 2020-09-05 DIAGNOSIS — R0600 Dyspnea, unspecified: Secondary | ICD-10-CM

## 2020-09-05 DIAGNOSIS — I10 Essential (primary) hypertension: Secondary | ICD-10-CM

## 2020-09-05 DIAGNOSIS — R0609 Other forms of dyspnea: Secondary | ICD-10-CM

## 2020-10-08 ENCOUNTER — Encounter (INDEPENDENT_AMBULATORY_CARE_PROVIDER_SITE_OTHER): Payer: Medicare Other | Admitting: Ophthalmology

## 2020-10-09 ENCOUNTER — Encounter (INDEPENDENT_AMBULATORY_CARE_PROVIDER_SITE_OTHER): Payer: Medicare Other | Admitting: Ophthalmology

## 2020-10-11 ENCOUNTER — Ambulatory Visit (INDEPENDENT_AMBULATORY_CARE_PROVIDER_SITE_OTHER): Payer: Medicare Other | Admitting: Ophthalmology

## 2020-10-11 ENCOUNTER — Encounter (INDEPENDENT_AMBULATORY_CARE_PROVIDER_SITE_OTHER): Payer: Self-pay | Admitting: Ophthalmology

## 2020-10-11 ENCOUNTER — Other Ambulatory Visit: Payer: Self-pay

## 2020-10-11 DIAGNOSIS — H353132 Nonexudative age-related macular degeneration, bilateral, intermediate dry stage: Secondary | ICD-10-CM | POA: Diagnosis not present

## 2020-10-11 DIAGNOSIS — H35721 Serous detachment of retinal pigment epithelium, right eye: Secondary | ICD-10-CM | POA: Diagnosis not present

## 2020-10-11 DIAGNOSIS — H353211 Exudative age-related macular degeneration, right eye, with active choroidal neovascularization: Secondary | ICD-10-CM | POA: Diagnosis not present

## 2020-10-11 MED ORDER — BEVACIZUMAB CHEMO INJECTION 1.25MG/0.05ML SYRINGE FOR KALEIDOSCOPE
1.2500 mg | INTRAVITREAL | Status: AC | PRN
Start: 2020-10-11 — End: 2020-10-11
  Administered 2020-10-11: 1.25 mg via INTRAVITREAL

## 2020-10-11 NOTE — Progress Notes (Signed)
10/11/2020     CHIEF COMPLAINT Patient presents for Retina Follow Up   HISTORY OF PRESENT ILLNESS: Emily Escobar is a 84 y.o. female who presents to the clinic today for:   HPI    Retina Follow Up    Patient presents with  Wet AMD.  In right eye.  This started 6 weeks ago.  Severity is mild.  Duration of 6 weeks.  Since onset it is stable.          Comments    6 Week AMD F/U OD, poss Avastin OD  Pt denies noticeable changes to [place] OU since last visit. Pt denies ocular pain, flashes of light, or floaters OU. Pt sts she got new glasses RX yesterday with Dr. Lucretia Roers, and is waiting on them to arrive.       Last edited by Ileana Roup, COA on 10/11/2020  9:35 AM. (History)      Referring physician: No referring provider defined for this encounter.  HISTORICAL INFORMATION:   Selected notes from the MEDICAL RECORD NUMBER       CURRENT MEDICATIONS: No current outpatient medications on file. (Ophthalmic Drugs)   No current facility-administered medications for this visit. (Ophthalmic Drugs)   Current Outpatient Medications (Other)  Medication Sig  . acetaminophen (TYLENOL) 500 MG tablet Take 1,000 mg by mouth every 8 (eight) hours as needed.  . ALPRAZolam (XANAX) 0.25 MG tablet Take 1 tablet (0.25 mg total) by mouth 2 (two) times daily as needed for anxiety.  Marland Kitchen amLODipine (NORVASC) 10 MG tablet Take 1 tablet by mouth daily.  Marland Kitchen atenolol (TENORMIN) 50 MG tablet Take 1 tablet by mouth daily.  Marland Kitchen atorvastatin (LIPITOR) 20 MG tablet Take 20 mg by mouth daily.  . busPIRone (BUSPAR) 5 MG tablet Take 1 tablet by mouth at bedtime.   Marland Kitchen CLARITIN 10 MG tablet Take 10 mg by mouth every morning.  Marland Kitchen FLUoxetine (PROZAC) 10 MG capsule Take 1 capsule by mouth daily.  . mirtazapine (REMERON) 15 MG tablet Take 7.5 mg by mouth at bedtime.   . Multiple Vitamin (MULTIVITAMIN WITH MINERALS) TABS tablet Take 1 tablet by mouth daily.  . sertraline (ZOLOFT) 25 MG tablet Take 1 tablet by mouth daily.    . traMADol (ULTRAM) 50 MG tablet Take one tablet by mouth every 6 hours as needed for moderate pain; Take two tablets by mouth every 6 hours as needed for severe pain  . WELLBUTRIN SR 150 MG 12 hr tablet Take 150 mg by mouth daily.   No current facility-administered medications for this visit. (Other)      REVIEW OF SYSTEMS:    ALLERGIES Allergies  Allergen Reactions  . Codeine     Pt states she has tolerated norco in the past  . Fentanyl Other (See Comments)    Pt reports having hallucinations.  . Morphine And Related     Patient's son states pt feels "looping" with morphine     PAST MEDICAL HISTORY Past Medical History:  Diagnosis Date  . Anxiety and depression   . Closed left femoral fracture (HCC)   . History of vertebral fracture    from fall in September 2016  . Hypertension   . Insomnia   . Macular degeneration   . Solitary kidney    Past Surgical History:  Procedure Laterality Date  . ABDOMINAL HYSTERECTOMY    . BLADDER SUSPENSION    . BREAST SURGERY    . CATARACT EXTRACTION, BILATERAL    . EYE  SURGERY    . GALLBLADDER SURGERY    . JOINT REPLACEMENT    . NASAL SEPTUM SURGERY    . RECONSTRUCTION OF EYELID    . RECTOCELE REPAIR    . TOTAL HIP ARTHROPLASTY Left 07/17/2016   Procedure: TOTAL HIP ARTHROPLASTY ANTERIOR APPROACH;  Surgeon: Tarry Kos, MD;  Location: [facility] OR;  Service: Orthopedics;  Laterality: Left;    FAMILY HISTORY Family History  Problem Relation Age of Onset  . Heart disease Mother   . Heart attack Father   . Heart disease Brother   . Heart disease Brother     SOCIAL HISTORY Social History   Tobacco Use  . Smoking status: Never Smoker  . Smokeless tobacco: Never Used  Vaping Use  . Vaping Use: Never used  Substance Use Topics  . Alcohol use: Yes    Alcohol/week: 3.0 - 4.0 standard drinks    Types: 3 - 4 Glasses of wine per week  . Drug use: No         OPHTHALMIC EXAM:  Base Eye Exam    Visual Acuity (ETDRS)       Right Left   Dist cc 20/70 +1 20/30 -1   Dist ph cc NI NI   Correction: Glasses       Tonometry (Tonopen, 9:35 AM)      Right Left   Pressure 21 20       Pupils      Pupils Dark Light Shape React APD   Right PERRL 4 3 Round Brisk None   Left PERRL 4 3 Round Brisk None       Visual Fields (Counting fingers)      Left Right    Full Full       Extraocular Movement      Right Left    Full Full       Neuro/Psych    Oriented x3: Yes   Mood/Affect: Normal       Dilation    Right eye: 1.0% Mydriacyl, 2.5% Phenylephrine @ 9:38 AM        Slit Lamp and Fundus Exam    External Exam      Right Left   External Normal Normal       Slit Lamp Exam      Right Left   Lids/Lashes Normal Normal   Conjunctiva/Sclera White and quiet White and quiet   Cornea Clear Clear   Anterior Chamber Deep and quiet Deep and quiet   Iris Round and reactive Round and reactive   Lens Centered posterior chamber intraocular lens Centered posterior chamber intraocular lens   Anterior Vitreous Normal Normal       Fundus Exam      Right Left   Posterior Vitreous Normal    Disc Normal    C/D Ratio 0.25    Macula Macular thickening, Drusen, Hard drusen, Intermediate age related macular degeneration    Vessels Normal    Periphery Normal           IMAGING AND PROCEDURES  Imaging and Procedures for 10/11/20  OCT, Retina - OU - Both Eyes       Right Eye Quality was good. Scan locations included subfoveal. Central Foveal Thickness: 322. Progression has been stable. Findings include subretinal fluid.   Left Eye Quality was good. Scan locations included subfoveal. Central Foveal Thickness: 277. Progression has been stable. Findings include retinal drusen .   Notes Significant subretinal fluid in the right eye remains at  6-week follow-up interval.  Repeat injection of intravitreal Avastin today examination in 5 weeks       Intravitreal Injection, Pharmacologic Agent - OD - Right Eye         Time Out 10/11/2020. 10:28 AM. Confirmed correct patient, procedure, site, and patient consented.   Anesthesia Topical anesthesia was used.   Procedure Preparation included Tobramycin 0.3%, 10% betadine to eyelids, 5% betadine to ocular surface. A supplied needle was used.   Injection:  1.25 mg Bevacizumab (AVASTIN) SOLN   NDC: 70360-001-02   Route: Intravitreal, Site: Right Eye, Waste: 0 mg  Post-op Post injection exam found visual acuity of at least counting fingers. The patient tolerated the procedure well. There were no complications. The patient received written and verbal post procedure care education. Post injection medications were not given.                 ASSESSMENT/PLAN:  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 choroidal neovascularization (HCC) Patient instructed that persistent subretinal fluid right eye from CNVM will require repeat injection today and shorter interval follow-up 5 weeks  Intermediate stage nonexudative age-related macular degeneration of both eyes No signs of CNVM left eye today on imaging      ICD-10-CM   1. 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 choroidal neovascularization (HCC)  H35.3211 OCT, Retina - OU - Both Eyes    Intravitreal Injection, Pharmacologic Agent - OD - Right Eye    Bevacizumab (AVASTIN) SOLN 1.25 mg  2. Intermediate stage nonexudative age-related macular degeneration of both eyes  H35.3132   3. Retinal pigment epithelium serous detachment, right  H35.721     1.  Repeat injection intravitreal Avastin today, OD  2.  Examination OD in 5 weeks  3.  Ophthalmic Meds Ordered this visit:  Meds ordered this encounter  Medications  . Bevacizumab (AVASTIN) SOLN 1.25 mg       Return in about 5 weeks (around 11/15/2020) for dilate, OD, AVASTIN OCT.  Patient Instructions  Patient instructed to contact the office promptly for new onset visual acuity decline  or distortions    Explained the diagnoses, plan, and follow up with the patient and they expressed understanding.  Patient expressed understanding of the importance of proper follow up care.   Alford Highland Efrat Zuidema M.D. Diseases & Surgery of the Retina and Vitreous [facility] 10/11/20     Abbreviations: M myopia (nearsighted); A astigmatism; H hyperopia (farsighted); P presbyopia; Mrx spectacle prescription;  CTL contact lenses; OD right eye; OS left eye; OU both eyes  XT exotropia; ET esotropia; PEK punctate epithelial keratitis; PEE punctate epithelial erosions; DES dry eye syndrome; MGD meibomian gland dysfunction; ATs artificial tears; PFAT's preservative free artificial tears; NSC nuclear sclerotic cataract; PSC posterior subcapsular cataract; ERM epi-retinal membrane; PVD posterior vitreous detachment; RD retinal detachment; DM diabetes mellitus; DR diabetic retinopathy; NPDR non-proliferative diabetic retinopathy; PDR proliferative diabetic retinopathy; CSME clinically significant macular edema; DME diabetic macular edema; dbh dot blot hemorrhages; CWS cotton wool spot; POAG primary open angle glaucoma; C/D cup-to-disc ratio; HVF humphrey visual field; GVF goldmann visual field; OCT optical coherence tomography; IOP intraocular pressure; BRVO Branch retinal vein occlusion; CRVO central retinal vein occlusion; CRAO central retinal artery occlusion; BRAO branch retinal artery occlusion; RT retinal tear; SB scleral buckle; PPV pars plana vitrectomy; VH Vitreous hemorrhage; PRP panretinal laser photocoagulation; IVK intravitreal kenalog; VMT vitreomacular traction; MH Macular hole;  NVD neovascularization of the disc; NVE neovascularization elsewhere;  AREDS age related eye disease study; ARMD age related macular degeneration; POAG primary open angle glaucoma; EBMD epithelial/anterior basement membrane dystrophy; ACIOL anterior chamber intraocular lens; IOL intraocular lens; PCIOL  posterior chamber intraocular lens; Phaco/IOL phacoemulsification with intraocular lens placement; PRK photorefractive keratectomy; LASIK laser assisted in situ keratomileusis; HTN hypertension; DM diabetes mellitus; COPD chronic obstructive pulmonary disease

## 2020-10-11 NOTE — Patient Instructions (Signed)
Patient instructed to contact the office promptly for new onset visual acuity decline or distortions 

## 2020-10-11 NOTE — Assessment & Plan Note (Signed)
No signs of CNVM left eye today on imaging

## 2020-10-11 NOTE — Assessment & Plan Note (Signed)
Patient instructed that persistent subretinal fluid right eye from CNVM will require repeat injection today and shorter interval follow-up 5 weeks

## 2020-11-13 ENCOUNTER — Other Ambulatory Visit: Payer: Self-pay | Admitting: Family Medicine

## 2020-11-13 DIAGNOSIS — R296 Repeated falls: Secondary | ICD-10-CM

## 2020-11-15 ENCOUNTER — Ambulatory Visit (INDEPENDENT_AMBULATORY_CARE_PROVIDER_SITE_OTHER): Payer: Medicare Other | Admitting: Ophthalmology

## 2020-11-15 ENCOUNTER — Other Ambulatory Visit: Payer: Self-pay

## 2020-11-15 ENCOUNTER — Encounter (INDEPENDENT_AMBULATORY_CARE_PROVIDER_SITE_OTHER): Payer: Self-pay | Admitting: Ophthalmology

## 2020-11-15 DIAGNOSIS — H353211 Exudative age-related macular degeneration, right eye, with active choroidal neovascularization: Secondary | ICD-10-CM | POA: Diagnosis not present

## 2020-11-15 DIAGNOSIS — H35721 Serous detachment of retinal pigment epithelium, right eye: Secondary | ICD-10-CM

## 2020-11-15 MED ORDER — BEVACIZUMAB CHEMO INJECTION 1.25MG/0.05ML SYRINGE FOR KALEIDOSCOPE
1.2500 mg | INTRAVITREAL | Status: AC | PRN
  Administered 2020-11-15: 1.25 mg via INTRAVITREAL

## 2020-11-15 NOTE — Progress Notes (Signed)
11/15/2020     CHIEF COMPLAINT Patient presents for Retina Follow Up   HISTORY OF PRESENT ILLNESS: Emily Escobar is a 84 y.o. female who presents to the clinic today for:   HPI    Retina Follow Up    Patient presents with  Wet AMD.  In right eye.  This started 5 weeks ago.  Severity is mild.  Duration of 5 weeks.  Since onset it is stable.          Comments    5 Week AMD F/U OD, poss Avastin OD  Pt sts she can't see to read OU. [place] OU overall stable per pt. No new symptoms.       Last edited by Ileana Roup, COA on 11/15/2020  9:57 AM. (History)      Referring physician: No referring provider defined for this encounter.  HISTORICAL INFORMATION:   Selected notes from the MEDICAL RECORD NUMBER       CURRENT MEDICATIONS: No current outpatient medications on file. (Ophthalmic Drugs)   No current facility-administered medications for this visit. (Ophthalmic Drugs)   Current Outpatient Medications (Other)  Medication Sig  . acetaminophen (TYLENOL) 500 MG tablet Take 1,000 mg by mouth every 8 (eight) hours as needed.  . ALPRAZolam (XANAX) 0.25 MG tablet Take 1 tablet (0.25 mg total) by mouth 2 (two) times daily as needed for anxiety.  Marland Kitchen amLODipine (NORVASC) 10 MG tablet Take 1 tablet by mouth daily.  Marland Kitchen atenolol (TENORMIN) 50 MG tablet Take 1 tablet by mouth daily.  Marland Kitchen atorvastatin (LIPITOR) 20 MG tablet Take 20 mg by mouth daily.  . busPIRone (BUSPAR) 5 MG tablet Take 1 tablet by mouth at bedtime.   Marland Kitchen CLARITIN 10 MG tablet Take 10 mg by mouth every morning.  Marland Kitchen FLUoxetine (PROZAC) 10 MG capsule Take 1 capsule by mouth daily.  . mirtazapine (REMERON) 15 MG tablet Take 7.5 mg by mouth at bedtime.   . Multiple Vitamin (MULTIVITAMIN WITH MINERALS) TABS tablet Take 1 tablet by mouth daily.  . sertraline (ZOLOFT) 25 MG tablet Take 1 tablet by mouth daily.  . traMADol (ULTRAM) 50 MG tablet Take one tablet by mouth every 6 hours as needed for moderate pain; Take two tablets by  mouth every 6 hours as needed for severe pain  . WELLBUTRIN SR 150 MG 12 hr tablet Take 150 mg by mouth daily.   No current facility-administered medications for this visit. (Other)      REVIEW OF SYSTEMS:    ALLERGIES Allergies  Allergen Reactions  . Codeine     Pt states she has tolerated norco in the past  . Fentanyl Other (See Comments)    Pt reports having hallucinations.  . Morphine And Related     Patient's son states pt feels "looping" with morphine     PAST MEDICAL HISTORY Past Medical History:  Diagnosis Date  . Anxiety and depression   . Closed left femoral fracture (HCC)   . History of vertebral fracture    from fall in September 2016  . Hypertension   . Insomnia   . Macular degeneration   . Solitary kidney    Past Surgical History:  Procedure Laterality Date  . ABDOMINAL HYSTERECTOMY    . BLADDER SUSPENSION    . BREAST SURGERY    . CATARACT EXTRACTION, BILATERAL    . EYE SURGERY    . GALLBLADDER SURGERY    . JOINT REPLACEMENT    . NASAL SEPTUM SURGERY    .  RECONSTRUCTION OF EYELID    . RECTOCELE REPAIR    . TOTAL HIP ARTHROPLASTY Left 07/17/2016   Procedure: TOTAL HIP ARTHROPLASTY ANTERIOR APPROACH;  Surgeon: Tarry Kos, MD;  Location: [facility] OR;  Service: Orthopedics;  Laterality: Left;    FAMILY HISTORY Family History  Problem Relation Age of Onset  . Heart disease Mother   . Heart attack Father   . Heart disease Brother   . Heart disease Brother     SOCIAL HISTORY Social History   Tobacco Use  . Smoking status: Never Smoker  . Smokeless tobacco: Never Used  Vaping Use  . Vaping Use: Never used  Substance Use Topics  . Alcohol use: Yes    Alcohol/week: 3.0 - 4.0 standard drinks    Types: 3 - 4 Glasses of wine per week  . Drug use: No         OPHTHALMIC EXAM: Base Eye Exam    Visual Acuity (ETDRS)      Right Left   Dist cc 20/70 20/40   Dist ph cc NI NI   Correction: Glasses       Tonometry (Tonopen, 9:57 AM)       Right Left   Pressure 17 18       Pupils      Pupils Dark Light Shape React APD   Right PERRL 4 3 Round Brisk None   Left PERRL 4 3 Round Brisk None       Visual Fields (Counting fingers)      Left Right    Full Full       Extraocular Movement      Right Left    Full Full       Neuro/Psych    Oriented x3: Yes   Mood/Affect: Normal       Dilation    Right eye: 1.0% Mydriacyl, 2.5% Phenylephrine @ 10:00 AM        Slit Lamp and Fundus Exam    External Exam      Right Left   External Normal Normal       Slit Lamp Exam      Right Left   Lids/Lashes Normal Normal   Conjunctiva/Sclera White and quiet White and quiet   Cornea Clear Clear   Anterior Chamber Deep and quiet Deep and quiet   Iris Round and reactive Round and reactive   Lens Centered posterior chamber intraocular lens Centered posterior chamber intraocular lens   Anterior Vitreous Normal Normal       Fundus Exam      Right Left   Posterior Vitreous Normal    Disc Normal    C/D Ratio 0.25    Macula Macular thickening with srf, Drusen, Hard drusen, Intermediate age related macular degeneration    Vessels Normal    Periphery Normal           IMAGING AND PROCEDURES  Imaging and Procedures for 11/15/20  OCT, Retina - OU - Both Eyes       Right Eye Quality was good. Scan locations included subfoveal. Central Foveal Thickness: 335. Progression has been stable. Findings include subretinal fluid, no IRF.   Left Eye Quality was good. Scan locations included subfoveal. Central Foveal Thickness: 282. Progression has been stable. Findings include retinal drusen , abnormal foveal contour, no IRF, no SRF.   Notes Decreased subretinal fluid in the right eye remains at -week follow-up interval.  Repeat injection of intravitreal Avastin today examination in 5 weeks  Intravitreal Injection, Pharmacologic Agent - OD - Right Eye       Time Out 11/15/2020. 11:24 AM. Confirmed correct patient,  procedure, site, and patient consented.   Anesthesia Topical anesthesia was used.   Procedure Preparation included 10% betadine to eyelids, 5% betadine to ocular surface, Ofloxacin . A 30 gauge needle was used.   Injection:  1.25 mg Bevacizumab (AVASTIN) 1.25mg /0.34mL SOLN   NDC: 70360-001-02, Lot: [id number]   Route: Intravitreal, Site: Right Eye, Waste: 0 mg  Post-op Post injection exam found visual acuity of at least counting fingers. The patient tolerated the procedure well. There were no complications. The patient received written and verbal post procedure care education. Post injection medications were not given.                 ASSESSMENT/PLAN:  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 choroidal neovascularization (HCC) Follow-up today at 5-week interval, less subretinal fluid post intravitreal Avastin as compared to prior interval of 6 weeks.  We will repeat injection today and examination in 5 weeks  Retinal pigment epithelium serous detachment, right As part of wet AMD, improving slowly      ICD-10-CM   1. 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 choroidal neovascularization (HCC)  H35.3211 OCT, Retina - OU - Both Eyes    Intravitreal Injection, Pharmacologic Agent - OD - Right Eye    Bevacizumab (AVASTIN) SOLN 1.25 mg  2. Retinal pigment epithelium serous detachment, right  H35.721     1.  Improved serous retinal detachment right eye today at 5-week shorter interval than six prior  2.  OD injection intravitreal Avastin, dilate OD next 3.  Ophthalmic Meds Ordered this visit:  Meds ordered this encounter  Medications  . Bevacizumab (AVASTIN) SOLN 1.25 mg       Return in about 5 weeks (around 12/20/2020) for dilate, OD, AVASTIN OCT.  Patient Instructions  Patient instructed to contact the office promptly new onset visual acuity decline or distortions    Explained the diagnoses, plan, and follow up  with the patient and they expressed understanding.  Patient expressed understanding of the importance of proper follow up care.   Alford Highland Jorgeluis Gurganus M.D. Diseases & Surgery of the Retina and Vitreous [facility] 11/15/20     Abbreviations: M myopia (nearsighted); A astigmatism; H hyperopia (farsighted); P presbyopia; Mrx spectacle prescription;  CTL contact lenses; OD right eye; OS left eye; OU both eyes  XT exotropia; ET esotropia; PEK punctate epithelial keratitis; PEE punctate epithelial erosions; DES dry eye syndrome; MGD meibomian gland dysfunction; ATs artificial tears; PFAT's preservative free artificial tears; NSC nuclear sclerotic cataract; PSC posterior subcapsular cataract; ERM epi-retinal membrane; PVD posterior vitreous detachment; RD retinal detachment; DM diabetes mellitus; DR diabetic retinopathy; NPDR non-proliferative diabetic retinopathy; PDR proliferative diabetic retinopathy; CSME clinically significant macular edema; DME diabetic macular edema; dbh dot blot hemorrhages; CWS cotton wool spot; POAG primary open angle glaucoma; C/D cup-to-disc ratio; HVF humphrey visual field; GVF goldmann visual field; OCT optical coherence tomography; IOP intraocular pressure; BRVO Branch retinal vein occlusion; CRVO central retinal vein occlusion; CRAO central retinal artery occlusion; BRAO branch retinal artery occlusion; RT retinal tear; SB scleral buckle; PPV pars plana vitrectomy; VH Vitreous hemorrhage; PRP panretinal laser photocoagulation; IVK intravitreal kenalog; VMT vitreomacular traction; MH Macular hole;  NVD neovascularization of the disc; NVE neovascularization elsewhere; AREDS age related eye disease study; ARMD age related macular degeneration; POAG primary open angle glaucoma; EBMD epithelial/anterior basement  membrane dystrophy; ACIOL anterior chamber intraocular lens; IOL intraocular lens; PCIOL posterior chamber intraocular lens; Phaco/IOL phacoemulsification with  intraocular lens placement; PRK photorefractive keratectomy; LASIK laser assisted in situ keratomileusis; HTN hypertension; DM diabetes mellitus; COPD chronic obstructive pulmonary disease

## 2020-11-15 NOTE — Assessment & Plan Note (Signed)
Follow-up today at 5-week interval, less subretinal fluid post intravitreal Avastin as compared to prior interval of 6 weeks.  We will repeat injection today and examination in 5 weeks

## 2020-11-15 NOTE — Assessment & Plan Note (Signed)
As part of wet AMD, improving slowly

## 2020-11-15 NOTE — Patient Instructions (Signed)
Patient instructed to contact the office promptly new onset visual acuity decline or distortions 

## 2020-11-29 ENCOUNTER — Other Ambulatory Visit: Payer: Self-pay

## 2020-11-29 ENCOUNTER — Ambulatory Visit
Admission: RE | Admit: 2020-11-29 | Discharge: 2020-11-29 | Disposition: A | Discharge status: Home / Self Care | Payer: Medicare Other | Source: Ambulatory Visit | Attending: Family Medicine | Admitting: Family Medicine

## 2020-11-29 DIAGNOSIS — R296 Repeated falls: Secondary | ICD-10-CM

## 2020-12-20 ENCOUNTER — Ambulatory Visit (INDEPENDENT_AMBULATORY_CARE_PROVIDER_SITE_OTHER): Payer: Medicare Other | Admitting: Ophthalmology

## 2020-12-20 ENCOUNTER — Other Ambulatory Visit: Payer: Self-pay

## 2020-12-20 ENCOUNTER — Encounter (INDEPENDENT_AMBULATORY_CARE_PROVIDER_SITE_OTHER): Payer: Self-pay | Admitting: Ophthalmology

## 2020-12-20 DIAGNOSIS — H353132 Nonexudative age-related macular degeneration, bilateral, intermediate dry stage: Secondary | ICD-10-CM

## 2020-12-20 DIAGNOSIS — H35721 Serous detachment of retinal pigment epithelium, right eye: Secondary | ICD-10-CM | POA: Diagnosis not present

## 2020-12-20 DIAGNOSIS — H353211 Exudative age-related macular degeneration, right eye, with active choroidal neovascularization: Secondary | ICD-10-CM

## 2020-12-20 MED ORDER — BEVACIZUMAB 2.5 MG/0.1ML IZ SOSY
2.5000 mg | PREFILLED_SYRINGE | INTRAVITREAL | Status: AC | PRN
  Administered 2020-12-20: 2.5 mg via INTRAVITREAL

## 2020-12-20 NOTE — Assessment & Plan Note (Addendum)
Chronic active subretinal fluid yet improving over time, will repeat injection today and examination again  in 5 weeks

## 2020-12-20 NOTE — Progress Notes (Signed)
12/20/2020     CHIEF COMPLAINT Patient presents for Retina Follow Up (5 Week AMD F/U OD, poss Avastin OD//Pt sts she doesn't see as well at distance and near OD, especially when reading. No other new symptoms reported OU.)   HISTORY OF PRESENT ILLNESS: Emily Escobar is a 85 y.o. female who presents to the clinic today for:   HPI    Retina Follow Up    Patient presents with  Wet AMD.  In right eye.  This started 5 weeks ago.  Severity is mild.  Duration of 5 weeks.  Since onset it is gradually worsening. Additional comments: 5 Week AMD F/U OD, poss Avastin OD  Pt sts she doesn't see as well at distance and near OD, especially when reading. No other new symptoms reported OU.       Last edited by Ileana Roup, COA on 12/20/2020 10:19 AM. (History)      Referring physician: Margot Ables, MD [address] [place],  [place] [address]  HISTORICAL INFORMATION:   Selected notes from the MEDICAL RECORD NUMBER       CURRENT MEDICATIONS: No current outpatient medications on file. (Ophthalmic Drugs)   No current facility-administered medications for this visit. (Ophthalmic Drugs)   Current Outpatient Medications (Other)  Medication Sig  . acetaminophen (TYLENOL) 500 MG tablet Take 1,000 mg by mouth every 8 (eight) hours as needed.  . ALPRAZolam (XANAX) 0.25 MG tablet Take 1 tablet (0.25 mg total) by mouth 2 (two) times daily as needed for anxiety.  Marland Kitchen amLODipine (NORVASC) 10 MG tablet Take 1 tablet by mouth daily.  Marland Kitchen atenolol (TENORMIN) 50 MG tablet Take 1 tablet by mouth daily.  Marland Kitchen atorvastatin (LIPITOR) 20 MG tablet Take 20 mg by mouth daily.  . busPIRone (BUSPAR) 5 MG tablet Take 1 tablet by mouth at bedtime.   Marland Kitchen CLARITIN 10 MG tablet Take 10 mg by mouth every morning.  Marland Kitchen FLUoxetine (PROZAC) 10 MG capsule Take 1 capsule by mouth daily.  . mirtazapine (REMERON) 15 MG tablet Take 7.5 mg by mouth at bedtime.   . Multiple Vitamin (MULTIVITAMIN WITH MINERALS) TABS tablet  Take 1 tablet by mouth daily.  . sertraline (ZOLOFT) 25 MG tablet Take 1 tablet by mouth daily.  . traMADol (ULTRAM) 50 MG tablet Take one tablet by mouth every 6 hours as needed for moderate pain; Take two tablets by mouth every 6 hours as needed for severe pain  . WELLBUTRIN SR 150 MG 12 hr tablet Take 150 mg by mouth daily.   No current facility-administered medications for this visit. (Other)      REVIEW OF SYSTEMS:    ALLERGIES Allergies  Allergen Reactions  . Codeine     Pt states she has tolerated norco in the past  . Fentanyl Other (See Comments)    Pt reports having hallucinations.  . Morphine And Related     Patient's son states pt feels "looping" with morphine     PAST MEDICAL HISTORY Past Medical History:  Diagnosis Date  . Anxiety and depression   . Closed left femoral fracture (HCC)   . History of vertebral fracture    from fall in September 2016  . Hypertension   . Insomnia   . Macular degeneration   . Solitary kidney    Past Surgical History:  Procedure Laterality Date  . ABDOMINAL HYSTERECTOMY    . BLADDER SUSPENSION    . BREAST SURGERY    . CATARACT EXTRACTION, BILATERAL    .  EYE SURGERY    . GALLBLADDER SURGERY    . JOINT REPLACEMENT    . NASAL SEPTUM SURGERY    . RECONSTRUCTION OF EYELID    . RECTOCELE REPAIR    . TOTAL HIP ARTHROPLASTY Left 07/17/2016   Procedure: TOTAL HIP ARTHROPLASTY ANTERIOR APPROACH;  Surgeon: Leandrew Koyanagi, MD;  Location: [facility];  Service: Orthopedics;  Laterality: Left;    FAMILY HISTORY Family History  Problem Relation Age of Onset  . Heart disease Mother   . Heart attack Father   . Heart disease Brother   . Heart disease Brother     SOCIAL HISTORY Social History   Tobacco Use  . Smoking status: Never Smoker  . Smokeless tobacco: Never Used  Vaping Use  . Vaping Use: Never used  Substance Use Topics  . Alcohol use: Yes    Alcohol/week: 3.0 - 4.0 standard drinks    Types: 3 - 4 Glasses of wine per week   . Drug use: No         OPHTHALMIC EXAM:  Base Eye Exam    Visual Acuity (ETDRS)      Right Left   Dist cc 20/70 20/30 -2   Dist ph cc NI NI   Correction: Glasses       Tonometry (Tonopen, 10:19 AM)      Right Left   Pressure 16 15       Pupils      Pupils Dark Light Shape React APD   Right PERRL 4 3 Round Brisk None   Left PERRL 4 3 Round Brisk None       Visual Fields (Counting fingers)      Left Right    Full Full       Extraocular Movement      Right Left    Full Full       Neuro/Psych    Oriented x3: Yes   Mood/Affect: Normal       Dilation    Right eye: 1.0% Mydriacyl, 2.5% Phenylephrine @ 10:22 AM        Slit Lamp and Fundus Exam    External Exam      Right Left   External Normal Normal       Slit Lamp Exam      Right Left   Lids/Lashes Normal Normal   Conjunctiva/Sclera White and quiet White and quiet   Cornea Clear Clear   Anterior Chamber Deep and quiet Deep and quiet   Iris Round and reactive Round and reactive   Lens Centered posterior chamber intraocular lens Centered posterior chamber intraocular lens   Anterior Vitreous Normal Normal       Fundus Exam      Right Left   Posterior Vitreous Normal    Disc Normal    C/D Ratio 0.25    Macula Macular thickening with srf, Drusen, Hard drusen, Intermediate age related macular degeneration    Vessels Normal    Periphery Normal           IMAGING AND PROCEDURES  Imaging and Procedures for 12/20/20  OCT, Retina - OU - Both Eyes       Right Eye Quality was good. Scan locations included subfoveal. Central Foveal Thickness: 293. Progression has improved. Findings include abnormal foveal contour, subretinal fluid, choroidal neovascular membrane.   Left Eye Quality was good. Scan locations included subfoveal. Central Foveal Thickness: 281. Progression has been stable. Findings include abnormal foveal contour, no SRF.   Notes  Persistent yet improving subretinal fluid OD, from  CNVM, will repeat injection today at 5-week interval and examination againIn 5 weeks       Intravitreal Injection, Pharmacologic Agent - OD - Right Eye       Time Out 12/20/2020. 11:01 AM. Confirmed correct patient, procedure, site, and patient consented.   Anesthesia Topical anesthesia was used.   Procedure Preparation included 10% betadine to eyelids, 5% betadine to ocular surface, Ofloxacin . A 30 gauge needle was used.   Injection:  2.5 mg Bevacizumab (AVASTIN) 2.5mg /0.69mL SOSY   NDC: 59163-846-65, Lot: [id number]   Route: Intravitreal, Site: Right Eye  Post-op Post injection exam found visual acuity of at least counting fingers. The patient tolerated the procedure well. There were no complications. The patient received written and verbal post procedure care education. Post injection medications were not given.                 ASSESSMENT/PLAN:  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 choroidal neovascularization (HCC) Chronic active subretinal fluid yet improving over time, will repeat injection today and examination again  in 5 weeks  Retinal pigment epithelium serous detachment, right Hard of wet ARMD, persistent yet improving  Intermediate stage nonexudative age-related macular degeneration of both eyes No sign of CNVM OS by OCT      ICD-10-CM   1. 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 choroidal neovascularization (HCC)  H35.3211 OCT, Retina - OU - Both Eyes    Intravitreal Injection, Pharmacologic Agent - OD - Right Eye    bevacizumab (AVASTIN) SOSY 2.5 mg  2. Retinal pigment epithelium serous detachment, right  H35.721   3. Intermediate stage nonexudative age-related macular degeneration of both eyes  H35.3132     1.  OD with chronic active CNVM, subretinal fluid present yet slowly improving, repeat injection today and again in 5 weeks  2.  3.  Ophthalmic Meds Ordered this visit:  Meds ordered  this encounter  Medications  . bevacizumab (AVASTIN) SOSY 2.5 mg       Return in about 5 weeks (around 01/24/2021) for dilate, AVASTIN OCT, OD.  There are no Patient Instructions on file for this visit.   Explained the diagnoses, plan, and follow up with the patient and they expressed understanding.  Patient expressed understanding of the importance of proper follow up care.   Alford Highland Zeddie Njie M.D. Diseases & Surgery of the Retina and Vitreous [facility] 12/20/20     Abbreviations: M myopia (nearsighted); A astigmatism; H hyperopia (farsighted); P presbyopia; Mrx spectacle prescription;  CTL contact lenses; OD right eye; OS left eye; OU both eyes  XT exotropia; ET esotropia; PEK punctate epithelial keratitis; PEE punctate epithelial erosions; DES dry eye syndrome; MGD meibomian gland dysfunction; ATs artificial tears; PFAT's preservative free artificial tears; NSC nuclear sclerotic cataract; PSC posterior subcapsular cataract; ERM epi-retinal membrane; PVD posterior vitreous detachment; RD retinal detachment; DM diabetes mellitus; DR diabetic retinopathy; NPDR non-proliferative diabetic retinopathy; PDR proliferative diabetic retinopathy; CSME clinically significant macular edema; DME diabetic macular edema; dbh dot blot hemorrhages; CWS cotton wool spot; POAG primary open angle glaucoma; C/D cup-to-disc ratio; HVF humphrey visual field; GVF goldmann visual field; OCT optical coherence tomography; IOP intraocular pressure; BRVO Branch retinal vein occlusion; CRVO central retinal vein occlusion; CRAO central retinal artery occlusion; BRAO branch retinal artery occlusion; RT retinal tear; SB scleral buckle; PPV pars plana vitrectomy; VH Vitreous hemorrhage; PRP panretinal laser photocoagulation; IVK intravitreal kenalog; VMT  vitreomacular traction; MH Macular hole;  NVD neovascularization of the disc; NVE neovascularization elsewhere; AREDS age related eye disease study; ARMD age  related macular degeneration; POAG primary open angle glaucoma; EBMD epithelial/anterior basement membrane dystrophy; ACIOL anterior chamber intraocular lens; IOL intraocular lens; PCIOL posterior chamber intraocular lens; Phaco/IOL phacoemulsification with intraocular lens placement; PRK photorefractive keratectomy; LASIK laser assisted in situ keratomileusis; HTN hypertension; DM diabetes mellitus; COPD chronic obstructive pulmonary disease

## 2020-12-20 NOTE — Assessment & Plan Note (Signed)
Hard of wet ARMD, persistent yet improving

## 2020-12-20 NOTE — Assessment & Plan Note (Signed)
No sign of CNVM OS by OCT 

## 2021-01-24 ENCOUNTER — Ambulatory Visit (INDEPENDENT_AMBULATORY_CARE_PROVIDER_SITE_OTHER): Payer: Medicare Other | Admitting: Ophthalmology

## 2021-01-24 ENCOUNTER — Encounter (INDEPENDENT_AMBULATORY_CARE_PROVIDER_SITE_OTHER): Payer: Self-pay | Admitting: Ophthalmology

## 2021-01-24 ENCOUNTER — Other Ambulatory Visit: Payer: Self-pay

## 2021-01-24 DIAGNOSIS — H353132 Nonexudative age-related macular degeneration, bilateral, intermediate dry stage: Secondary | ICD-10-CM

## 2021-01-24 DIAGNOSIS — H353211 Exudative age-related macular degeneration, right eye, with active choroidal neovascularization: Secondary | ICD-10-CM | POA: Diagnosis not present

## 2021-01-24 MED ORDER — BEVACIZUMAB 2.5 MG/0.1ML IZ SOSY
2.5000 mg | PREFILLED_SYRINGE | INTRAVITREAL | Status: AC | PRN
  Administered 2021-01-24: 2.5 mg via INTRAVITREAL

## 2021-01-24 NOTE — Assessment & Plan Note (Signed)
Subretinal fluid persist in the right eye consistent with preservation of good vision however.  Controlled on intravitreal Avastin will repeat injection today

## 2021-01-24 NOTE — Progress Notes (Signed)
01/24/2021     CHIEF COMPLAINT Patient presents for Retina Follow Up (5 Week Wet AMD f\u OD. Possible Avastin OD. OCT/Pt c/o a black spot in OD vision. States it has been there for a couple of weeks. )   HISTORY OF PRESENT ILLNESS: Emily Escobar is a 85 y.o. female who presents to the clinic today for:   HPI    Retina Follow Up    Patient presents with  Wet AMD.  In right eye.  Severity is moderate.  Duration of 5.  Since onset it is stable.  I, the attending physician,  performed the HPI with the patient and updated documentation appropriately. Additional comments: 5 Week Wet AMD f\u OD. Possible Avastin OD. OCT Pt c/o a black spot in OD vision. States it has been there for a couple of weeks.        Last edited by Elyse Jarvis on 01/24/2021 10:19 AM. (History)      Referring physician: Margot Ables, MD [address] [place],  [place] [address]  HISTORICAL INFORMATION:   Selected notes from the MEDICAL RECORD NUMBER       CURRENT MEDICATIONS: No current outpatient medications on file. (Ophthalmic Drugs)   No current facility-administered medications for this visit. (Ophthalmic Drugs)   Current Outpatient Medications (Other)  Medication Sig  . acetaminophen (TYLENOL) 500 MG tablet Take 1,000 mg by mouth every 8 (eight) hours as needed.  . ALPRAZolam (XANAX) 0.25 MG tablet Take 1 tablet (0.25 mg total) by mouth 2 (two) times daily as needed for anxiety.  Marland Kitchen amLODipine (NORVASC) 10 MG tablet Take 1 tablet by mouth daily.  Marland Kitchen atenolol (TENORMIN) 50 MG tablet Take 1 tablet by mouth daily.  Marland Kitchen atorvastatin (LIPITOR) 20 MG tablet Take 20 mg by mouth daily.  . busPIRone (BUSPAR) 5 MG tablet Take 1 tablet by mouth at bedtime.   Marland Kitchen CLARITIN 10 MG tablet Take 10 mg by mouth every morning.  Marland Kitchen FLUoxetine (PROZAC) 10 MG capsule Take 1 capsule by mouth daily.  . mirtazapine (REMERON) 15 MG tablet Take 7.5 mg by mouth at bedtime.   . Multiple Vitamin (MULTIVITAMIN WITH MINERALS)  TABS tablet Take 1 tablet by mouth daily.  . sertraline (ZOLOFT) 25 MG tablet Take 1 tablet by mouth daily.  . traMADol (ULTRAM) 50 MG tablet Take one tablet by mouth every 6 hours as needed for moderate pain; Take two tablets by mouth every 6 hours as needed for severe pain  . WELLBUTRIN SR 150 MG 12 hr tablet Take 150 mg by mouth daily.   No current facility-administered medications for this visit. (Other)      REVIEW OF SYSTEMS:    ALLERGIES Allergies  Allergen Reactions  . Codeine     Pt states she has tolerated norco in the past  . Fentanyl Other (See Comments)    Pt reports having hallucinations.  . Morphine And Related     Patient's son states pt feels "looping" with morphine     PAST MEDICAL HISTORY Past Medical History:  Diagnosis Date  . Anxiety and depression   . Closed left femoral fracture (HCC)   . History of vertebral fracture    from fall in September 2016  . Hypertension   . Insomnia   . Macular degeneration   . Solitary kidney    Past Surgical History:  Procedure Laterality Date  . ABDOMINAL HYSTERECTOMY    . BLADDER SUSPENSION    . BREAST SURGERY    .  CATARACT EXTRACTION, BILATERAL    . EYE SURGERY    . GALLBLADDER SURGERY    . JOINT REPLACEMENT    . NASAL SEPTUM SURGERY    . RECONSTRUCTION OF EYELID    . RECTOCELE REPAIR    . TOTAL HIP ARTHROPLASTY Left 07/17/2016   Procedure: TOTAL HIP ARTHROPLASTY ANTERIOR APPROACH;  Surgeon: Tarry Kos, MD;  Location: [facility];  Service: Orthopedics;  Laterality: Left;    FAMILY HISTORY Family History  Problem Relation Age of Onset  . Heart disease Mother   . Heart attack Father   . Heart disease Brother   . Heart disease Brother     SOCIAL HISTORY Social History   Tobacco Use  . Smoking status: Never Smoker  . Smokeless tobacco: Never Used  Vaping Use  . Vaping Use: Never used  Substance Use Topics  . Alcohol use: Yes    Alcohol/week: 3.0 - 4.0 standard drinks    Types: 3 - 4 Glasses of  wine per week  . Drug use: No         OPHTHALMIC EXAM: Base Eye Exam    Visual Acuity (Snellen - Linear)      Right Left   Dist cc 20/100 20/50 +2   Dist ph cc 20/80 20/40   Correction: Glasses       Tonometry (Tonopen, 10:24 AM)      Right Left   Pressure 18 19       Pupils      Pupils Dark Light Shape React APD   Right PERRL 4 3 Round Brisk None   Left PERRL 4 3 Round Brisk None       Visual Fields (Counting fingers)      Left Right    Full Full       Neuro/Psych    Oriented x3: Yes   Mood/Affect: Normal       Dilation    Right eye: 1.0% Mydriacyl, 2.5% Phenylephrine @ 10:24 AM        Slit Lamp and Fundus Exam    External Exam      Right Left   External Normal Normal       Slit Lamp Exam      Right Left   Lids/Lashes Normal Normal   Conjunctiva/Sclera White and quiet White and quiet   Cornea Clear Clear   Anterior Chamber Deep and quiet Deep and quiet   Iris Round and reactive Round and reactive   Lens Centered posterior chamber intraocular lens Centered posterior chamber intraocular lens   Anterior Vitreous Normal Normal       Fundus Exam      Right Left   Posterior Vitreous Normal    Disc Normal    C/D Ratio 0.2    Macula Macular thickening with srf, Drusen, Hard drusen, Intermediate age related macular degeneration, Retinal pigment epithelial mottling    Vessels Normal    Periphery Normal           IMAGING AND PROCEDURES  Imaging and Procedures for 01/24/21  OCT, Retina - OU - Both Eyes       Right Eye Quality was good. Scan locations included subfoveal. Central Foveal Thickness: 281. Progression has improved. Findings include abnormal foveal contour, subretinal fluid, choroidal neovascular membrane.   Left Eye Quality was good. Scan locations included subfoveal. Central Foveal Thickness: 280. Progression has been stable. Findings include abnormal foveal contour, no SRF.   Notes Persistent yet improving subretinal fluid OD,  from  CNVM, will repeat injection today at 5-week interval and examination again In 5 weeks       Intravitreal Injection, Pharmacologic Agent - OD - Right Eye       Time Out 01/24/2021. 11:46 AM. Confirmed correct patient, procedure, site, and patient consented.   Anesthesia Topical anesthesia was used.   Procedure Preparation included 10% betadine to eyelids, 5% betadine to ocular surface, Ofloxacin . A 30 gauge needle was used.   Injection:  2.5 mg Bevacizumab (AVASTIN) 2.5mg /0.14mL SOSY   NDC: 00762-263-33, Lot: [id number]   Route: Intravitreal, Site: Right Eye  Post-op Post injection exam found visual acuity of at least counting fingers. The patient tolerated the procedure well. There were no complications. The patient received written and verbal post procedure care education. Post injection medications were not given.                 ASSESSMENT/PLAN:  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 choroidal neovascularization (HCC) Subretinal fluid persist in the right eye consistent with preservation of good vision however.  Controlled on intravitreal Avastin will repeat injection today  Intermediate stage nonexudative age-related macular degeneration of both eyes No signs of CNVM OS today by OCT evaluation      ICD-10-CM   1. 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 choroidal neovascularization (HCC)  H35.3211 OCT, Retina - OU - Both Eyes    Intravitreal Injection, Pharmacologic Agent - OD - Right Eye    bevacizumab (AVASTIN) SOSY 2.5 mg  2. Intermediate stage nonexudative age-related macular degeneration of both eyes  H35.3132     1.  OD looks great, status post injection of intravitreal Avastin, at 5-week interval, persistent chronic subretinal fluid with preserved visual acuity.  2.  3.  Ophthalmic Meds Ordered this visit:  Meds ordered this encounter  Medications  . bevacizumab (AVASTIN) SOSY 2.5 mg        Return for DILATE OU, AVASTIN OCT, OD.  There are no Patient Instructions on file for this visit.   Explained the diagnoses, plan, and follow up with the patient and they expressed understanding.  Patient expressed understanding of the importance of proper follow up care.   Alford Highland Jahniya Duzan M.D. Diseases & Surgery of the Retina and Vitreous [facility] 01/24/21     Abbreviations: M myopia (nearsighted); A astigmatism; H hyperopia (farsighted); P presbyopia; Mrx spectacle prescription;  CTL contact lenses; OD right eye; OS left eye; OU both eyes  XT exotropia; ET esotropia; PEK punctate epithelial keratitis; PEE punctate epithelial erosions; DES dry eye syndrome; MGD meibomian gland dysfunction; ATs artificial tears; PFAT's preservative free artificial tears; NSC nuclear sclerotic cataract; PSC posterior subcapsular cataract; ERM epi-retinal membrane; PVD posterior vitreous detachment; RD retinal detachment; DM diabetes mellitus; DR diabetic retinopathy; NPDR non-proliferative diabetic retinopathy; PDR proliferative diabetic retinopathy; CSME clinically significant macular edema; DME diabetic macular edema; dbh dot blot hemorrhages; CWS cotton wool spot; POAG primary open angle glaucoma; C/D cup-to-disc ratio; HVF humphrey visual field; GVF goldmann visual field; OCT optical coherence tomography; IOP intraocular pressure; BRVO Branch retinal vein occlusion; CRVO central retinal vein occlusion; CRAO central retinal artery occlusion; BRAO branch retinal artery occlusion; RT retinal tear; SB scleral buckle; PPV pars plana vitrectomy; VH Vitreous hemorrhage; PRP panretinal laser photocoagulation; IVK intravitreal kenalog; VMT vitreomacular traction; MH Macular hole;  NVD neovascularization of the disc; NVE neovascularization elsewhere; AREDS age related eye disease study; ARMD age related macular degeneration; POAG primary open angle glaucoma;  EBMD epithelial/anterior basement membrane  dystrophy; ACIOL anterior chamber intraocular lens; IOL intraocular lens; PCIOL posterior chamber intraocular lens; Phaco/IOL phacoemulsification with intraocular lens placement; PRK photorefractive keratectomy; LASIK laser assisted in situ keratomileusis; HTN hypertension; DM diabetes mellitus; COPD chronic obstructive pulmonary disease

## 2021-01-24 NOTE — Assessment & Plan Note (Signed)
No signs of CNVM OS today by OCT evaluation

## 2021-02-28 ENCOUNTER — Other Ambulatory Visit: Payer: Self-pay

## 2021-02-28 ENCOUNTER — Encounter (INDEPENDENT_AMBULATORY_CARE_PROVIDER_SITE_OTHER): Payer: Self-pay | Admitting: Ophthalmology

## 2021-02-28 ENCOUNTER — Ambulatory Visit (INDEPENDENT_AMBULATORY_CARE_PROVIDER_SITE_OTHER): Payer: Medicare Other | Admitting: Ophthalmology

## 2021-02-28 DIAGNOSIS — H353211 Exudative age-related macular degeneration, right eye, with active choroidal neovascularization: Secondary | ICD-10-CM | POA: Diagnosis not present

## 2021-02-28 DIAGNOSIS — H353132 Nonexudative age-related macular degeneration, bilateral, intermediate dry stage: Secondary | ICD-10-CM

## 2021-02-28 MED ORDER — BEVACIZUMAB 2.5 MG/0.1ML IZ SOSY
2.5000 mg | PREFILLED_SYRINGE | INTRAVITREAL | Status: AC | PRN
  Administered 2021-02-28: 2.5 mg via INTRAVITREAL

## 2021-02-28 NOTE — Progress Notes (Signed)
02/28/2021     CHIEF COMPLAINT Patient presents for Retina Follow Up (5 Wk F/U OU, poss Avastin OD//Pt c/o double VA OU off and on when she is wearing her glasses and looking at something up close. Pt c/o seeing spots at night OD. Pt c/o decreasing VA OD over time.)   HISTORY OF PRESENT ILLNESS: Emily Escobar is a 85 y.o. female who presents to the clinic today for:   HPI    Retina Follow Up    Patient presents with  Wet AMD.  In right eye.  This started 5 weeks ago.  Severity is mild.  Duration of 5 weeks.  Since onset it is gradually worsening. Additional comments: 5 Wk F/U OU, poss Avastin OD  Pt c/o double VA OU off and on when she is wearing her glasses and looking at something up close. Pt c/o seeing spots at night OD. Pt c/o decreasing VA OD over time.       Last edited by Ileana Roup, COA on 02/28/2021 10:18 AM. (History)      Referring physician: Margot Ables, MD [address] [place],  [place] [address]  HISTORICAL INFORMATION:   Selected notes from the MEDICAL RECORD NUMBER       CURRENT MEDICATIONS: No current outpatient medications on file. (Ophthalmic Drugs)   No current facility-administered medications for this visit. (Ophthalmic Drugs)   Current Outpatient Medications (Other)  Medication Sig  . acetaminophen (TYLENOL) 500 MG tablet Take 1,000 mg by mouth every 8 (eight) hours as needed.  . ALPRAZolam (XANAX) 0.25 MG tablet Take 1 tablet (0.25 mg total) by mouth 2 (two) times daily as needed for anxiety.  Marland Kitchen amLODipine (NORVASC) 10 MG tablet Take 1 tablet by mouth daily.  Marland Kitchen atenolol (TENORMIN) 50 MG tablet Take 1 tablet by mouth daily.  Marland Kitchen atorvastatin (LIPITOR) 20 MG tablet Take 20 mg by mouth daily.  . busPIRone (BUSPAR) 5 MG tablet Take 1 tablet by mouth at bedtime.   Marland Kitchen CLARITIN 10 MG tablet Take 10 mg by mouth every morning.  Marland Kitchen FLUoxetine (PROZAC) 10 MG capsule Take 1 capsule by mouth daily.  . mirtazapine (REMERON) 15 MG tablet Take 7.5 mg by  mouth at bedtime.   . Multiple Vitamin (MULTIVITAMIN WITH MINERALS) TABS tablet Take 1 tablet by mouth daily.  . sertraline (ZOLOFT) 25 MG tablet Take 1 tablet by mouth daily.  . traMADol (ULTRAM) 50 MG tablet Take one tablet by mouth every 6 hours as needed for moderate pain; Take two tablets by mouth every 6 hours as needed for severe pain  . WELLBUTRIN SR 150 MG 12 hr tablet Take 150 mg by mouth daily.   No current facility-administered medications for this visit. (Other)      REVIEW OF SYSTEMS:    ALLERGIES Allergies  Allergen Reactions  . Codeine     Pt states she has tolerated norco in the past  . Fentanyl Other (See Comments)    Pt reports having hallucinations.  . Morphine And Related     Patient's son states pt feels "looping" with morphine     PAST MEDICAL HISTORY Past Medical History:  Diagnosis Date  . Anxiety and depression   . Closed left femoral fracture (HCC)   . History of vertebral fracture    from fall in September 2016  . Hypertension   . Insomnia   . Macular degeneration   . Solitary kidney    Past Surgical History:  Procedure Laterality Date  .  ABDOMINAL HYSTERECTOMY    . BLADDER SUSPENSION    . BREAST SURGERY    . CATARACT EXTRACTION, BILATERAL    . EYE SURGERY    . GALLBLADDER SURGERY    . JOINT REPLACEMENT    . NASAL SEPTUM SURGERY    . RECONSTRUCTION OF EYELID    . RECTOCELE REPAIR    . TOTAL HIP ARTHROPLASTY Left 07/17/2016   Procedure: TOTAL HIP ARTHROPLASTY ANTERIOR APPROACH;  Surgeon: Tarry Kos, MD;  Location: [facility] OR;  Service: Orthopedics;  Laterality: Left;    FAMILY HISTORY Family History  Problem Relation Age of Onset  . Heart disease Mother   . Heart attack Father   . Heart disease Brother   . Heart disease Brother     SOCIAL HISTORY Social History   Tobacco Use  . Smoking status: Never Smoker  . Smokeless tobacco: Never Used  Vaping Use  . Vaping Use: Never used  Substance Use Topics  . Alcohol use: Yes     Alcohol/week: 3.0 - 4.0 standard drinks    Types: 3 - 4 Glasses of wine per week  . Drug use: No         OPHTHALMIC EXAM: Base Eye Exam    Visual Acuity (ETDRS)      Right Left   Dist cc 20/80 -1 20/50   Dist ph cc NI NI   Correction: Glasses       Tonometry (Tonopen, 10:12 AM)      Right Left   Pressure 20 17       Pupils      Pupils Dark Light Shape React APD   Right PERRL 6 5 Round Brisk None   Left PERRL 6 5 Round Brisk None       Visual Fields (Counting fingers)      Left Right    Full Full       Extraocular Movement      Right Left    Full Full       Neuro/Psych    Oriented x3: Yes   Mood/Affect: Normal       Dilation    Both eyes: 1.0% Mydriacyl, 2.5% Phenylephrine @ 10:17 AM        Slit Lamp and Fundus Exam    External Exam      Right Left   External Normal Normal       Slit Lamp Exam      Right Left   Lids/Lashes Normal Normal   Conjunctiva/Sclera White and quiet White and quiet   Cornea Clear Clear   Anterior Chamber Deep and quiet Deep and quiet   Iris Round and reactive Round and reactive   Lens Centered posterior chamber intraocular lens Centered posterior chamber intraocular lens   Anterior Vitreous Normal Normal       Fundus Exam      Right Left   Posterior Vitreous Normal Normal   Disc Normal Normal   C/D Ratio 0.2 0.2   Macula Macular thickening with srf, Drusen, Hard drusen, Intermediate age related macular degeneration, Retinal pigment epithelial mottling Retinal pigment epithelial mottling   Vessels Normal Normal   Periphery Normal Normal          IMAGING AND PROCEDURES  Imaging and Procedures for 02/28/21  OCT, Retina - OU - Both Eyes       Right Eye Quality was good. Scan locations included subfoveal. Central Foveal Thickness: 290. Progression has improved. Findings include abnormal foveal contour, subretinal fluid, choroidal  neovascular membrane.   Left Eye Quality was good. Scan locations included  subfoveal. Central Foveal Thickness: 285. Progression has been stable. Findings include abnormal foveal contour, no SRF.   Notes Persistent yet improving subretinal fluid OD, from CNVM, will repeat injection today at 5-week interval and examination again In 5 weeks         Intravitreal Injection, Pharmacologic Agent - OD - Right Eye       Time Out 02/28/2021. 11:01 AM. Confirmed correct patient, procedure, site, and patient consented.   Anesthesia Topical anesthesia was used.   Procedure Preparation included 10% betadine to eyelids, 5% betadine to ocular surface, Ofloxacin . A 30 gauge needle was used.   Injection:  2.5 mg Bevacizumab (AVASTIN) 2.5mg /0.63mL SOSY   NDC: 45809-983-38, Lot: [id number]   Route: Intravitreal, Site: Right Eye  Post-op Post injection exam found visual acuity of at least counting fingers. The patient tolerated the procedure well. There were no complications. The patient received written and verbal post procedure care education. Post injection medications were not given.                 ASSESSMENT/PLAN:  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 choroidal neovascularization (HCC) Chronic subretinal fluid yet with preserved visual acuity right eye, vascularized PED subfoveal OD repeat Avastin injection OD today, at 5-week interval  Intermediate stage nonexudative age-related macular degeneration of both eyes Dilate examination today left eye, no signs of active CNVM will continue to observe and monitor with OCT as well      ICD-10-CM   1. 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 choroidal neovascularization (HCC)  H35.3211 OCT, Retina - OU - Both Eyes    Intravitreal Injection, Pharmacologic Agent - OD - Right Eye    bevacizumab (AVASTIN) SOSY 2.5 mg  2. Intermediate stage nonexudative age-related macular degeneration of both eyes  H35.3132     1.  OS looks great no signs of CNVM will  continue to observe  2.  OD with chronic serous retinal detachment yet with preserved visual acuity and functioning with vascularized subfoveal pigment epithelial detachment, wet AMD-CNVM  3.  Ophthalmic Meds Ordered this visit:  Meds ordered this encounter  Medications  . bevacizumab (AVASTIN) SOSY 2.5 mg       Return in about 5 weeks (around 04/04/2021) for dilate, OD, AVASTIN OCT.  There are no Patient Instructions on file for this visit.   Explained the diagnoses, plan, and follow up with the patient and they expressed understanding.  Patient expressed understanding of the importance of proper follow up care.   Alford Highland Jobie Popp M.D. Diseases & Surgery of the Retina and Vitreous [facility] 02/28/21     Abbreviations: M myopia (nearsighted); A astigmatism; H hyperopia (farsighted); P presbyopia; Mrx spectacle prescription;  CTL contact lenses; OD right eye; OS left eye; OU both eyes  XT exotropia; ET esotropia; PEK punctate epithelial keratitis; PEE punctate epithelial erosions; DES dry eye syndrome; MGD meibomian gland dysfunction; ATs artificial tears; PFAT's preservative free artificial tears; NSC nuclear sclerotic cataract; PSC posterior subcapsular cataract; ERM epi-retinal membrane; PVD posterior vitreous detachment; RD retinal detachment; DM diabetes mellitus; DR diabetic retinopathy; NPDR non-proliferative diabetic retinopathy; PDR proliferative diabetic retinopathy; CSME clinically significant macular edema; DME diabetic macular edema; dbh dot blot hemorrhages; CWS cotton wool spot; POAG primary open angle glaucoma; C/D cup-to-disc ratio; HVF humphrey visual field; GVF goldmann visual field; OCT optical coherence tomography; IOP intraocular pressure; BRVO Branch  retinal vein occlusion; CRVO central retinal vein occlusion; CRAO central retinal artery occlusion; BRAO branch retinal artery occlusion; RT retinal tear; SB scleral buckle; PPV pars plana vitrectomy;  VH Vitreous hemorrhage; PRP panretinal laser photocoagulation; IVK intravitreal kenalog; VMT vitreomacular traction; MH Macular hole;  NVD neovascularization of the disc; NVE neovascularization elsewhere; AREDS age related eye disease study; ARMD age related macular degeneration; POAG primary open angle glaucoma; EBMD epithelial/anterior basement membrane dystrophy; ACIOL anterior chamber intraocular lens; IOL intraocular lens; PCIOL posterior chamber intraocular lens; Phaco/IOL phacoemulsification with intraocular lens placement; PRK photorefractive keratectomy; LASIK laser assisted in situ keratomileusis; HTN hypertension; DM diabetes mellitus; COPD chronic obstructive pulmonary disease

## 2021-02-28 NOTE — Assessment & Plan Note (Signed)
Dilate examination today left eye, no signs of active CNVM will continue to observe and monitor with OCT as well

## 2021-02-28 NOTE — Assessment & Plan Note (Signed)
Chronic subretinal fluid yet with preserved visual acuity right eye, vascularized PED subfoveal OD repeat Avastin injection OD today, at 5-week interval

## 2021-04-01 ENCOUNTER — Encounter (INDEPENDENT_AMBULATORY_CARE_PROVIDER_SITE_OTHER): Payer: Self-pay

## 2021-04-02 ENCOUNTER — Encounter (INDEPENDENT_AMBULATORY_CARE_PROVIDER_SITE_OTHER): Payer: Medicare Other | Admitting: Ophthalmology

## 2021-04-02 ENCOUNTER — Telehealth: Payer: Self-pay | Admitting: Neurology

## 2021-04-02 ENCOUNTER — Ambulatory Visit (INDEPENDENT_AMBULATORY_CARE_PROVIDER_SITE_OTHER): Payer: Medicare Other | Admitting: Ophthalmology

## 2021-04-02 ENCOUNTER — Ambulatory Visit: Payer: Medicare Other | Admitting: Neurology

## 2021-04-02 ENCOUNTER — Encounter (INDEPENDENT_AMBULATORY_CARE_PROVIDER_SITE_OTHER): Payer: Self-pay | Admitting: Ophthalmology

## 2021-04-02 ENCOUNTER — Encounter: Payer: Self-pay | Admitting: Neurology

## 2021-04-02 ENCOUNTER — Other Ambulatory Visit: Payer: Self-pay

## 2021-04-02 VITALS — BP 193/83 | HR 63 | Ht 62.0 in | Wt 171.0 lb

## 2021-04-02 DIAGNOSIS — H353211 Exudative age-related macular degeneration, right eye, with active choroidal neovascularization: Secondary | ICD-10-CM | POA: Diagnosis not present

## 2021-04-02 DIAGNOSIS — R269 Unspecified abnormalities of gait and mobility: Secondary | ICD-10-CM | POA: Diagnosis not present

## 2021-04-02 DIAGNOSIS — H353132 Nonexudative age-related macular degeneration, bilateral, intermediate dry stage: Secondary | ICD-10-CM

## 2021-04-02 DIAGNOSIS — H35721 Serous detachment of retinal pigment epithelium, right eye: Secondary | ICD-10-CM

## 2021-04-02 DIAGNOSIS — R4781 Slurred speech: Secondary | ICD-10-CM

## 2021-04-02 DIAGNOSIS — R4189 Other symptoms and signs involving cognitive functions and awareness: Secondary | ICD-10-CM

## 2021-04-02 MED ORDER — ALPRAZOLAM 0.5 MG PO TABS: ORAL_TABLET | ORAL | 0 refills | Status: DC

## 2021-04-02 MED ORDER — BEVACIZUMAB 2.5 MG/0.1ML IZ SOSY
2.5000 mg | PREFILLED_SYRINGE | INTRAVITREAL | Status: AC | PRN
  Administered 2021-04-02: 2.5 mg via INTRAVITREAL

## 2021-04-02 NOTE — Progress Notes (Signed)
Chief Complaint  Patient presents with  . New Patient (Initial Visit)    MoCA: 17/30. She is here with her daughter-in-law, Mirna Sutcliffe. Reports a slow decline of memory over years. She lives at [place] [facility] [place] in independent living.      ASSESSMENT AND PLAN  Emily Escobar is a 85 y.o. female   Cognitive impairment  MoCA examination 17/30  CT head of the brain showed generalized atrophy, supratentorium small vessel disease  Will proceed with MRI of brain  Laboratory evaluation to rule out treatable etiology  Gait abnormality, frequent falls,  She was hyperreflexia, mild proximal muscle weakness, bilateral Babinski signs, slow spastic tongue movement, no sensory complains  MRI of cervical spine to rule out cervical spondylitic myelopathy  DIAGNOSTIC DATA (LABS, IMAGING, TESTING) - I reviewed patient records, labs, notes, testing and imaging myself where available.  CT head without contrast in Dec 2021.  No acute abnormality, mild supratentorium small vessel disease  Laboratory evaluation in May 2021: Normal CMP, CBC, hemoglobin of 13.8, creatinine 0.9, negative troponin, negative D-dimer, A1c of 5.1, normal TSH  HISTORICAL  Emily Escobar, is a 85 year old female, accompanied by her daughter-in-law Tabatha Razzano, seen in request by her primary care physician Dr. Margot Ables for evaluation of gait abnormality, frequent falling, memory loss, April 02, 2021  I reviewed and summarized the referring note. PMHX. HTN HLD Depression,  She was on polypharmacy, tapering off wellbutrin.  Patient used to live in [place], moved to independent living in September 2021 to be close to her son, she is a retired Interior and spatial designer, family history of dementia, mother suffered dementia  She was noted to have gradual onset of memory loss for about 10 years, gradually getting worse, misplace things, feels frustrated, no also complains of increased gait abnormality, begin to rely on  her walker,  Today's MoCA examination was 17 out of 30,  CT head in December 2021 showed no acute abnormality, mild generalized atrophy supratentorium small vessel disease  Patient reported irregular sleep pattern, staying up late, sleeping late, often losing her breakfast poor appetite, not enough water intake, she has bilateral lower extremity paresthesia, slow worsening gait abnormality,   REVIEW OF SYSTEMS:  Full 14 system review of systems performed and notable only for as above All other review of systems were negative.  PHYSICAL EXAM:   Vitals:   04/02/21 1016  BP: (!) 193/83  Pulse: 63  Weight: 171 lb (77.6 kg)  Height: 5\' 2"  (1.575 m)   Not recorded     Body mass index is 31.28 kg/m.  PHYSICAL EXAMNIATION:  Gen: NAD, conversant, well nourised, well groomed                     Cardiovascular: Regular rate rhythm, no peripheral edema, warm, nontender. Eyes: Conjunctivae clear without exudates or hemorrhage Neck: Supple, no carotid bruits. Pulmonary: Clear to auscultation bilaterally   NEUROLOGICAL EXAM:  MENTAL STATUS: Speech: slow to talk, no dysarthria, no aphasia,    Montreal Cognitive Assessment  04/02/2021  Visuospatial/ Executive (0/5) 4  Naming (0/3) 2  Attention: Read list of digits (0/2) 2  Attention: Read list of letters (0/1) 1  Attention: Serial 7 subtraction starting at 100 (0/3) 3  Language: Repeat phrase (0/2) 0  Language : Fluency (0/1) 0  Abstraction (0/2) 0  Delayed Recall (0/5) 1  Orientation (0/6) 4  Total 17  Adjusted Score (based on education) 17   CRANIAL NERVES: CN II: Visual fields are full  to confrontation. Pupils are round equal and briskly reactive to light. CN III, IV, VI: extraocular movement are normal. No ptosis. CN V: Facial sensation is intact to light touch CN VII: Face is symmetric with normal eye closure  CN VIII: Hearing is normal to causal conversation. CN IX, X: Phonation is normal. CN XI: Head turning and  shoulder shrug are intact CN XII: slow spastic tongue movement, no fasciculation or atrophy noted  MOTOR: Mild bilateral hip flexion weakness.  REFLEXES: Reflexes are 3 and symmetric at the biceps, triceps, 3/3 knees, and absent at ankles. Plantar responses are extensor bilaterally  SENSORY: Mild decreased vibratory sensation  at toes.  COORDINATION: There is no trunk or limb dysmetria noted.  GAIT/STANCE: She needs to push up to get up from seated position, wide based, stiff, cautious, unsteady  ALLERGIES: Allergies  Allergen Reactions  . Codeine     Pt states she has tolerated norco in the past  . Fentanyl Other (See Comments)    Pt reports having hallucinations.  . Morphine And Related     Patient's son states pt feels "looping" with morphine     HOME MEDICATIONS: Current Outpatient Medications  Medication Sig Dispense Refill  . acetaminophen (TYLENOL) 500 MG tablet Take 1,000 mg by mouth every 8 (eight) hours as needed.    . ALPRAZolam (XANAX) 0.25 MG tablet Take 1 tablet (0.25 mg total) by mouth 2 (two) times daily as needed for anxiety. 60 tablet 0  . amLODipine (NORVASC) 10 MG tablet Take 1 tablet by mouth daily.    Marland Kitchen atenolol (TENORMIN) 50 MG tablet Take 1 tablet by mouth daily.    Marland Kitchen atorvastatin (LIPITOR) 20 MG tablet Take 20 mg by mouth daily.    . busPIRone (BUSPAR) 5 MG tablet Take 1 tablet by mouth at bedtime.     Marland Kitchen CLARITIN 10 MG tablet Take 10 mg by mouth every morning.    Marland Kitchen FLUoxetine (PROZAC) 10 MG capsule Take 1 capsule by mouth daily.    . mirtazapine (REMERON) 15 MG tablet Take 7.5 mg by mouth at bedtime.     . Multiple Vitamin (MULTIVITAMIN WITH MINERALS) TABS tablet Take 1 tablet by mouth daily.    . sertraline (ZOLOFT) 25 MG tablet Take 1 tablet by mouth daily.    . traMADol (ULTRAM) 50 MG tablet Take one tablet by mouth every 6 hours as needed for moderate pain; Take two tablets by mouth every 6 hours as needed for severe pain 240 tablet 0  .  WELLBUTRIN SR 150 MG 12 hr tablet Take 150 mg by mouth daily. She is weaning off this medication. Currently taking one tablet every 3 days.     No current facility-administered medications for this visit.    PAST MEDICAL HISTORY: Past Medical History:  Diagnosis Date  . Anxiety and depression   . Closed left femoral fracture (HCC)   . History of vertebral fracture    from fall in September 2016  . Hypertension   . Insomnia   . Macular degeneration   . Memory loss   . Solitary kidney     PAST SURGICAL HISTORY: Past Surgical History:  Procedure Laterality Date  . ABDOMINAL HYSTERECTOMY    . BLADDER SUSPENSION    . BREAST SURGERY    . CATARACT EXTRACTION, BILATERAL    . EYE SURGERY    . GALLBLADDER SURGERY    . JOINT REPLACEMENT    . NASAL SEPTUM SURGERY    . RECONSTRUCTION OF  EYELID    . RECTOCELE REPAIR    . TOTAL HIP ARTHROPLASTY Left 07/17/2016   Procedure: TOTAL HIP ARTHROPLASTY ANTERIOR APPROACH;  Surgeon: Tarry Kos, MD;  Location: [facility] OR;  Service: Orthopedics;  Laterality: Left;    FAMILY HISTORY: Family History  Problem Relation Age of Onset  . Heart disease Mother   . Heart attack Father   . Heart disease Brother   . Heart disease Brother     SOCIAL HISTORY: Social History   Socioeconomic History  . Marital status: Widowed    Spouse name: Not on file  . Number of children: 2  . Years of education: 75  . Highest education level: Not on file  Occupational History  . Occupation: retired  Tobacco Use  . Smoking status: Never Smoker  . Smokeless tobacco: Never Used  Vaping Use  . Vaping Use: Never used  Substance and Sexual Activity  . Alcohol use: Not Currently  . Drug use: No  . Sexual activity: Not on file  Other Topics Concern  . Not on file  Social History Narrative   Lives at home alone.   Right-handed.   Caffeine use: one cup coffee.   Social Determinants of Health   Financial Resource Strain: Not on file  Food Insecurity: Not on  file  Transportation Needs: Not on file  Physical Activity: Not on file  Stress: Not on file  Social Connections: Not on file  Intimate Partner Violence: Not on file      Levert Feinstein, M.D. Ph.D.  [facility] [address] [place], [place] [address] Ph: [phone] Fax: [phone]  CC:  Margot Ables, MD [address] [place],  [place] [address]  Margot Ables, MD

## 2021-04-02 NOTE — Progress Notes (Signed)
04/02/2021     CHIEF COMPLAINT Patient presents for Retina Follow Up (5 Wk F/U OD, poss Avastin OD//Pt sts she thinks her glasses are too strong bc she gets dizzy when wearing them. No other new symptoms reported OU.)   HISTORY OF PRESENT ILLNESS: Emily Escobar is a 85 y.o. female who presents to the clinic today for:   HPI    Retina Follow Up    Patient presents with  Wet AMD.  In right eye.  This started 5 weeks ago.  Severity is mild.  Duration of 5 weeks.  Since onset it is stable. Additional comments: 5 Wk F/U OD, poss Avastin OD  Pt sts she thinks her glasses are too strong bc she gets dizzy when wearing them. No other new symptoms reported OU.       Last edited by Ileana Roup, COA on 04/02/2021  3:21 PM. (History)      Referring physician: Margot Ables, MD [address] [place],  [place] [address]  HISTORICAL INFORMATION:   Selected notes from the MEDICAL RECORD NUMBER       CURRENT MEDICATIONS: No current outpatient medications on file. (Ophthalmic Drugs)   No current facility-administered medications for this visit. (Ophthalmic Drugs)   Current Outpatient Medications (Other)  Medication Sig  . acetaminophen (TYLENOL) 500 MG tablet Take 1,000 mg by mouth every 8 (eight) hours as needed.  . ALPRAZolam (XANAX) 0.25 MG tablet Take 1 tablet (0.25 mg total) by mouth 2 (two) times daily as needed for anxiety.  . ALPRAZolam (XANAX) 0.5 MG tablet Take 1-2 tablets 30 minutes prior to MRI, may repeat once as needed. Must have driver.  Marland Kitchen amLODipine (NORVASC) 10 MG tablet Take 1 tablet by mouth daily.  Marland Kitchen atenolol (TENORMIN) 50 MG tablet Take 1 tablet by mouth daily.  Marland Kitchen atorvastatin (LIPITOR) 20 MG tablet Take 20 mg by mouth daily.  . busPIRone (BUSPAR) 5 MG tablet Take 1 tablet by mouth at bedtime.   Marland Kitchen CLARITIN 10 MG tablet Take 10 mg by mouth every morning.  Marland Kitchen FLUoxetine (PROZAC) 10 MG capsule Take 1 capsule by mouth daily.  . mirtazapine (REMERON) 15 MG  tablet Take 7.5 mg by mouth at bedtime.   . Multiple Vitamin (MULTIVITAMIN WITH MINERALS) TABS tablet Take 1 tablet by mouth daily.  . sertraline (ZOLOFT) 25 MG tablet Take 1 tablet by mouth daily.  . traMADol (ULTRAM) 50 MG tablet Take one tablet by mouth every 6 hours as needed for moderate pain; Take two tablets by mouth every 6 hours as needed for severe pain   No current facility-administered medications for this visit. (Other)      REVIEW OF SYSTEMS:    ALLERGIES Allergies  Allergen Reactions  . Codeine     Pt states she has tolerated norco in the past  . Fentanyl Other (See Comments)    Pt reports having hallucinations.  . Morphine And Related     Patient's son states pt feels "looping" with morphine     PAST MEDICAL HISTORY Past Medical History:  Diagnosis Date  . Anxiety and depression   . Closed left femoral fracture (HCC)   . History of vertebral fracture    from fall in September 2016  . Hypertension   . Insomnia   . Macular degeneration   . Memory loss   . Solitary kidney    Past Surgical History:  Procedure Laterality Date  . ABDOMINAL HYSTERECTOMY    . BLADDER SUSPENSION    .  BREAST SURGERY    . CATARACT EXTRACTION, BILATERAL    . EYE SURGERY    . GALLBLADDER SURGERY    . JOINT REPLACEMENT    . NASAL SEPTUM SURGERY    . RECONSTRUCTION OF EYELID    . RECTOCELE REPAIR    . TOTAL HIP ARTHROPLASTY Left 07/17/2016   Procedure: TOTAL HIP ARTHROPLASTY ANTERIOR APPROACH;  Surgeon: Tarry Kos, MD;  Location: [facility];  Service: Orthopedics;  Laterality: Left;    FAMILY HISTORY Family History  Problem Relation Age of Onset  . Heart disease Mother   . Heart attack Father   . Heart disease Brother   . Heart disease Brother     SOCIAL HISTORY Social History   Tobacco Use  . Smoking status: Never Smoker  . Smokeless tobacco: Never Used  Vaping Use  . Vaping Use: Never used  Substance Use Topics  . Alcohol use: Not Currently  . Drug use: No          OPHTHALMIC EXAM:  Base Eye Exam    Visual Acuity (ETDRS)      Right Left   Dist cc 20/100 -2 20/50 +1   Dist ph cc 20/70 20/40   Correction: Glasses  Pt using older rx glasses today       Tonometry (Tonopen, 3:26 PM)      Right Left   Pressure 23 22  Pt squeezing       Pupils      Pupils Dark Light Shape React APD   Right PERRL 6 5 Round Brisk None   Left PERRL 6 5 Round Brisk None       Visual Fields (Counting fingers)      Left Right    Full Full       Extraocular Movement      Right Left    Full Full       Neuro/Psych    Oriented x3: Yes   Mood/Affect: Normal       Dilation    Right eye: 1.0% Mydriacyl, 2.5% Phenylephrine @ 3:27 PM        Slit Lamp and Fundus Exam    External Exam      Right Left   External Normal Normal       Slit Lamp Exam      Right Left   Lids/Lashes Normal Normal   Conjunctiva/Sclera White and quiet White and quiet   Cornea Clear Clear   Anterior Chamber Deep and quiet Deep and quiet   Iris Round and reactive Round and reactive   Lens Centered posterior chamber intraocular lens Centered posterior chamber intraocular lens   Anterior Vitreous Normal Normal       Fundus Exam      Right Left   Posterior Vitreous Normal    Disc Normal    C/D Ratio 0.2    Macula Macular thickening with srf, Drusen, Hard drusen, Intermediate age related macular degeneration, Retinal pigment epithelial mottling    Vessels Normal    Periphery Normal           IMAGING AND PROCEDURES  Imaging and Procedures for 04/02/21  OCT, Retina - OU - Both Eyes       Right Eye Quality was good. Scan locations included subfoveal. Central Foveal Thickness: 293. Progression has improved. Findings include abnormal foveal contour, subretinal fluid, choroidal neovascular membrane.   Left Eye Quality was good. Scan locations included subfoveal. Central Foveal Thickness: 287. Progression has been stable. Findings include  abnormal foveal contour,  no SRF.   Notes Persistent yet improving subretinal fluid OD, from CNVM, will repeat injection today at 5-week interval and examination again In 5 weeks, with persistent subretinal fluid OD in a foveal location         Intravitreal Injection, Pharmacologic Agent - OD - Right Eye       Time Out 04/02/2021. 3:42 PM. Confirmed correct patient, procedure, site, and patient consented.   Anesthesia Topical anesthesia was used.   Procedure Preparation included 10% betadine to eyelids, 5% betadine to ocular surface, Ofloxacin . A 30 gauge needle was used.   Injection:  2.5 mg Bevacizumab (AVASTIN) 2.5mg /0.36mL SOSY   NDC: 54270-623-76, Lot: [id number]   Route: Intravitreal, Site: Right Eye  Post-op Post injection exam found visual acuity of at least counting fingers. The patient tolerated the procedure well. There were no complications. The patient received written and verbal post procedure care education. Post injection medications were not given.                 ASSESSMENT/PLAN:  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 choroidal neovascularization (HCC) Macular anatomy OD slightly improved yet with persistent subretinal fluid in foveal location likely accounts for acuity.    Intermediate stage nonexudative age-related macular degeneration of both eyes Continue on AREDS 2 supplements in order to protect the left eye  Retinal pigment epithelium serous detachment, right Component of wet AMD will continue to treat with Avastin for CNVM      ICD-10-CM   1. 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 choroidal neovascularization (HCC)  H35.3211 OCT, Retina - OU - Both Eyes    Intravitreal Injection, Pharmacologic Agent - OD - Right Eye    bevacizumab (AVASTIN) SOSY 2.5 mg  2. Intermediate stage nonexudative age-related macular degeneration of both eyes  H35.3132   3. Retinal pigment epithelium serous detachment, right   H35.721     1.  OD looks great yet still active CNVM foveal location we will treat again with Avastin today and follow-up in 5 to 6 weeks  2.  3.  Ophthalmic Meds Ordered this visit:  Meds ordered this encounter  Medications  . bevacizumab (AVASTIN) SOSY 2.5 mg       Return for RV 5 to 6 weeks, dilate, OD, AVASTIN OCT.  There are no Patient Instructions on file for this visit.   Explained the diagnoses, plan, and follow up with the patient and they expressed understanding.  Patient expressed understanding of the importance of proper follow up care.   Alford Highland Aadvik Roker M.D. Diseases & Surgery of the Retina and Vitreous [facility] 04/02/21     Abbreviations: M myopia (nearsighted); A astigmatism; H hyperopia (farsighted); P presbyopia; Mrx spectacle prescription;  CTL contact lenses; OD right eye; OS left eye; OU both eyes  XT exotropia; ET esotropia; PEK punctate epithelial keratitis; PEE punctate epithelial erosions; DES dry eye syndrome; MGD meibomian gland dysfunction; ATs artificial tears; PFAT's preservative free artificial tears; NSC nuclear sclerotic cataract; PSC posterior subcapsular cataract; ERM epi-retinal membrane; PVD posterior vitreous detachment; RD retinal detachment; DM diabetes mellitus; DR diabetic retinopathy; NPDR non-proliferative diabetic retinopathy; PDR proliferative diabetic retinopathy; CSME clinically significant macular edema; DME diabetic macular edema; dbh dot blot hemorrhages; CWS cotton wool spot; POAG primary open angle glaucoma; C/D cup-to-disc ratio; HVF humphrey visual field; GVF goldmann visual field; OCT optical coherence tomography; IOP intraocular pressure; BRVO Branch retinal vein occlusion; CRVO central retinal  vein occlusion; CRAO central retinal artery occlusion; BRAO branch retinal artery occlusion; RT retinal tear; SB scleral buckle; PPV pars plana vitrectomy; VH Vitreous hemorrhage; PRP panretinal laser photocoagulation;  IVK intravitreal kenalog; VMT vitreomacular traction; MH Macular hole;  NVD neovascularization of the disc; NVE neovascularization elsewhere; AREDS age related eye disease study; ARMD age related macular degeneration; POAG primary open angle glaucoma; EBMD epithelial/anterior basement membrane dystrophy; ACIOL anterior chamber intraocular lens; IOL intraocular lens; PCIOL posterior chamber intraocular lens; Phaco/IOL phacoemulsification with intraocular lens placement; PRK photorefractive keratectomy; LASIK laser assisted in situ keratomileusis; HTN hypertension; DM diabetes mellitus; COPD chronic obstructive pulmonary disease

## 2021-04-02 NOTE — Assessment & Plan Note (Signed)
Continue on AREDS 2 supplements in order to protect the left eye

## 2021-04-02 NOTE — Assessment & Plan Note (Addendum)
Component of wet AMD will continue to treat with Avastin for CNVM

## 2021-04-02 NOTE — Telephone Encounter (Signed)
schedule for 04/16/21 4pm

## 2021-04-02 NOTE — Telephone Encounter (Signed)
[employer] medicare no auth order faxed to [facility] for the open mri they will reach out to the patient to schedule.

## 2021-04-02 NOTE — Assessment & Plan Note (Signed)
Macular anatomy OD slightly improved yet with persistent subretinal fluid in foveal location likely accounts for acuity.

## 2021-04-02 NOTE — Telephone Encounter (Signed)
Patient need open MRI and also Xanax as needed for MRI

## 2021-04-03 ENCOUNTER — Encounter (INDEPENDENT_AMBULATORY_CARE_PROVIDER_SITE_OTHER): Payer: Medicare Other | Admitting: Ophthalmology

## 2021-04-05 LAB — CBC WITH DIFFERENTIAL/PLATELET
Basophils Absolute: 0 10*3/uL (ref 0.0–0.2)
Basos: 0 %
EOS (ABSOLUTE): 0.1 10*3/uL (ref 0.0–0.4)
Eos: 2 %
Hematocrit: 44.8 % (ref 34.0–46.6)
Hemoglobin: 14.9 g/dL (ref 11.1–15.9)
Immature Grans (Abs): 0 10*3/uL (ref 0.0–0.1)
Immature Granulocytes: 0 %
Lymphocytes Absolute: 1.1 10*3/uL (ref 0.7–3.1)
Lymphs: 17 %
MCH: 30.3 pg (ref 26.6–33.0)
MCHC: 33.3 g/dL (ref 31.5–35.7)
MCV: 91 fL (ref 79–97)
Monocytes Absolute: 0.5 10*3/uL (ref 0.1–0.9)
Monocytes: 7 %
Neutrophils Absolute: 4.9 10*3/uL (ref 1.4–7.0)
Neutrophils: 74 %
Platelets: 152 10*3/uL (ref 150–450)
RBC: 4.91 x10E6/uL (ref 3.77–5.28)
RDW: 12.9 % (ref 11.7–15.4)
WBC: 6.7 10*3/uL (ref 3.4–10.8)

## 2021-04-05 LAB — MULTIPLE MYELOMA PANEL, SERUM
Albumin SerPl Elph-Mcnc: 3.6 g/dL (ref 2.9–4.4)
Albumin/Glob SerPl: 1.3 (ref 0.7–1.7)
Alpha 1: 0.2 g/dL (ref 0.0–0.4)
Alpha2 Glob SerPl Elph-Mcnc: 1 g/dL (ref 0.4–1.0)
B-Globulin SerPl Elph-Mcnc: 1 g/dL (ref 0.7–1.3)
Gamma Glob SerPl Elph-Mcnc: 0.6 g/dL (ref 0.4–1.8)
Globulin, Total: 2.8 g/dL (ref 2.2–3.9)
IgA/Immunoglobulin A, Serum: 124 mg/dL (ref 64–422)
IgG (Immunoglobin G), Serum: 573 mg/dL — ABNORMAL LOW (ref 586–1602)
IgM (Immunoglobulin M), Srm: 29 mg/dL (ref 26–217)

## 2021-04-05 LAB — VITAMIN B12: Vitamin B-12: 635 pg/mL (ref 232–1245)

## 2021-04-05 LAB — CK: Total CK: 60 U/L (ref 26–161)

## 2021-04-05 LAB — COMPREHENSIVE METABOLIC PANEL
ALT: 19 IU/L (ref 0–32)
AST: 17 IU/L (ref 0–40)
Albumin/Globulin Ratio: 2 (ref 1.2–2.2)
Albumin: 4.3 g/dL (ref 3.6–4.6)
Alkaline Phosphatase: 79 IU/L (ref 44–121)
BUN/Creatinine Ratio: 22 (ref 12–28)
BUN: 19 mg/dL (ref 8–27)
Bilirubin Total: 0.4 mg/dL (ref 0.0–1.2)
CO2: 24 mmol/L (ref 20–29)
Calcium: 9.6 mg/dL (ref 8.7–10.3)
Chloride: 105 mmol/L (ref 96–106)
Creatinine, Ser: 0.87 mg/dL (ref 0.57–1.00)
Globulin, Total: 2.1 g/dL (ref 1.5–4.5)
Glucose: 94 mg/dL (ref 65–99)
Potassium: 5.1 mmol/L (ref 3.5–5.2)
Sodium: 145 mmol/L — ABNORMAL HIGH (ref 134–144)
Total Protein: 6.4 g/dL (ref 6.0–8.5)
eGFR: 66 mL/min/{1.73_m2} (ref >59)

## 2021-04-05 LAB — COPPER, SERUM: Copper: 92 ug/dL (ref 80–158)

## 2021-04-05 LAB — TSH: TSH: 2.69 u[IU]/mL (ref 0.450–4.500)

## 2021-04-05 LAB — FOLATE: Folate: 17.8 ng/mL (ref >3.0)

## 2021-04-05 LAB — HGB A1C W/O EAG: Hgb A1c MFr Bld: 5.6 % (ref 4.8–5.6)

## 2021-04-05 LAB — C-REACTIVE PROTEIN: CRP: 1 mg/L (ref 0–10)

## 2021-04-05 LAB — ANA W/REFLEX IF POSITIVE: Anti Nuclear Antibody (ANA): NEGATIVE

## 2021-04-05 LAB — SEDIMENTATION RATE: Sed Rate: 2 mm/hr (ref 0–40)

## 2021-04-05 LAB — RPR: RPR Ser Ql: NONREACTIVE

## 2021-04-05 LAB — FERRITIN: Ferritin: 78 ng/mL (ref 15–150)

## 2021-04-05 LAB — VITAMIN D 25 HYDROXY (VIT D DEFICIENCY, FRACTURES): Vit D, 25-Hydroxy: 32.5 ng/mL (ref 30.0–100.0)

## 2021-04-08 ENCOUNTER — Telehealth: Payer: Self-pay | Admitting: *Deleted

## 2021-04-08 NOTE — Telephone Encounter (Signed)
I spoke to the patient's dgt-in-law on DPR, Debbie Swiatek. She verbalized understanding of the lab findings.

## 2021-04-08 NOTE — Telephone Encounter (Signed)
-----   Message from Levert Feinstein, MD sent at 04/08/2021  2:18 PM EDT ----- Please call patient: There was no significant abnormality on laboratory evaluations

## 2021-04-18 ENCOUNTER — Telehealth: Payer: Self-pay | Admitting: Neurology

## 2021-04-18 NOTE — Telephone Encounter (Signed)
1. Cervical spondylosis most significant at C4-5 with grade 1 anterolisthesis contributing to moderate canal stenosis with severe right neural foraminal narrowing.   2. C5-6 and C6-7 mild canal stenosis.   1. No acute intracranial pathology identified.   2. Moderate generalized cerebral atrophy. No disproportionate atrophy seen in the medial temporal lobe hippocampi to suggest Alzheimer's disease   3. Mild chronic small vessel ischemic change   Please call patient, MRI of the brain showed generalized atrophy,  MRI of cervical spine showed evidence of cervical spondylosis, most significant at C4-5, with moderate canal stenosis,  Make sure she keep her previously scheduled EMG nerve conduction study appointment, I will review films with her at next follow-up visit

## 2021-04-22 NOTE — Telephone Encounter (Signed)
Called daughter-in-law on DPR and advised her of Dr Zannie Cove MRI brain and cervical spine results. Reminded her of EMG appt. She  verbalized understanding, appreciation.

## 2021-05-01 ENCOUNTER — Ambulatory Visit: Payer: Medicare Other | Admitting: Neurology

## 2021-05-01 ENCOUNTER — Ambulatory Visit (INDEPENDENT_AMBULATORY_CARE_PROVIDER_SITE_OTHER): Payer: Medicare Other | Admitting: Neurology

## 2021-05-01 ENCOUNTER — Other Ambulatory Visit: Payer: Self-pay

## 2021-05-01 DIAGNOSIS — R4189 Other symptoms and signs involving cognitive functions and awareness: Secondary | ICD-10-CM

## 2021-05-01 DIAGNOSIS — R4781 Slurred speech: Secondary | ICD-10-CM

## 2021-05-01 DIAGNOSIS — R269 Unspecified abnormalities of gait and mobility: Secondary | ICD-10-CM

## 2021-05-01 DIAGNOSIS — R202 Paresthesia of skin: Secondary | ICD-10-CM | POA: Diagnosis not present

## 2021-05-01 NOTE — Progress Notes (Signed)
ASSESSMENT AND PLAN  Emily Escobar is a 85 y.o. female   Cognitive impairment Gait abnormality, frequent falls,  MoCA examination 17/30  CT head and MRI of the brain showed generalized atrophy, mild supratentorium supratentorium small vessel disease  On examination, she has left more than right bradykinesia, rigidity, hyperreflexia, bilateral Babinski signs, slow spastic tongue movement,  MRI of the cervical spine showed no significant canal stenosis  Laboratory evaluation showed no treatable etiology  Above combination of slow progressive memory loss, gait abnormality, mild parkinsonian features, most consistent with central nervous system degenerative disorder, suspicious for multiple system atrophy  Continue moderate exercise  10 to clinic in 6 months  DIAGNOSTIC DATA (LABS, IMAGING, TESTING) - I reviewed patient records, labs, notes, testing and imaging myself where available.  CT head without contrast in Dec 2021.  No acute abnormality, mild supratentorium small vessel disease  Laboratory evaluation in May 2021: Normal CMP, CBC, hemoglobin of 13.8, creatinine 0.9, negative troponin, negative D-dimer, A1c of 5.1, normal TSH  More extensive laboratory evaluation on April 02, 2021: Normal CMP, creatinine 0.87, vitamin D 32.5, normal CBC, hemoglobin of 14.9, protein electrophoresis, copper, ANA, ferritin, A1c 5.6, normal ESR, CPK, TSH, C-reactive protein, folic acid, vitamin Z61, RPR,  MRI of brain from [facility] in May 2022: No acute abnormality, moderate generalized atrophy, mild supratentorium small vessel disease  MRI of cervical spine showed multilevel degenerative changes, no significant canal stenosis variable degree of foraminal narrowing, most obvious at right C4-5  HISTORICAL  Emily Escobar, is a 85 year old female, accompanied by her daughter-in-law Pattie Flaharty, seen in request by her primary care physician Dr. Buzzy Han for evaluation of gait  abnormality, frequent falling, memory loss, April 02, 2021  I reviewed and summarized the referring note. PMHX. HTN HLD Depression,  She was on polypharmacy, tapering off wellbutrin.  Patient used to live in [place], moved to independent living in September 2021 to be close to her son, she is a retired Theme park manager, family history of dementia, mother suffered dementia  She was noted to have gradual onset of memory loss for about 10 years, gradually getting worse, misplace things, feels frustrated, no also complains of increased gait abnormality, begin to rely on her walker,  Today's MoCA examination was 17 out of 30,  CT head in December 2021 showed no acute abnormality, mild generalized atrophy supratentorium small vessel disease  Patient reported irregular sleep pattern, staying up late, sleeping late, often losing her breakfast poor appetite, not enough water intake, she has bilateral lower extremity paresthesia, slow worsening gait abnormality  Update May 01, 2021: She is accompanied by her daughter-in-law at today's visit, overall slow worsening gait abnormality, slurred speech, Electrodiagnostic study today showed no significant abnormality, no evidence of large fiber peripheral neuropathy, right cervical or lumbosacral radiculopathy MRI of the brain showed mild generalized atrophy supratentorium small vessel disease no acute abnormality  MRI of cervical spine showed multilevel degenerative changes, no evidence of canal stenosis, Laboratory evaluation showed no treatable etiology  She is most concerned about her slow worsening gait abnormality, tendency to fall backwards,    REVIEW OF SYSTEMS:  Full 14 system review of systems performed and notable only for as above All other review of systems were negative.  PHYSICAL EXAM:   There were no vitals filed for this visit.  There is no height or weight on file to calculate BMI.  PHYSICAL EXAMNIATION:  Gen: NAD, conversant, well  nourised, well groomed  Cardiovascular: Regular rate rhythm, no peripheral edema, warm, nontender. Eyes: Conjunctivae clear without exudates or hemorrhage Neck: Supple, no carotid bruits. Pulmonary: Clear to auscultation bilaterally   NEUROLOGICAL EXAM:  MENTAL STATUS: Speech: slow to talk, no dysarthria, no aphasia,    Montreal Cognitive Assessment  04/02/2021  Visuospatial/ Executive (0/5) 4  Naming (0/3) 2  Attention: Read list of digits (0/2) 2  Attention: Read list of letters (0/1) 1  Attention: Serial 7 subtraction starting at 100 (0/3) 3  Language: Repeat phrase (0/2) 0  Language : Fluency (0/1) 0  Abstraction (0/2) 0  Delayed Recall (0/5) 1  Orientation (0/6) 4  Total 17  Adjusted Score (based on education) 17   CRANIAL NERVES: CN II: Visual fields are full to confrontation. Pupils are round equal and briskly reactive to light. CN III, IV, VI: extraocular movement are normal. No ptosis. CN V: Facial sensation is intact to light touch CN VII: Face is symmetric with normal eye closure  CN VIII: Hearing is normal to causal conversation. CN IX, X: Phonation is normal. CN XI: Head turning and shoulder shrug are intact CN XII: slow spastic tongue movement, no fasciculation or atrophy noted  MOTOR: Mild bilateral hip flexion weakness.  REFLEXES: Reflexes are 3 and symmetric at the biceps, triceps, 3/3 knees, and absent at ankles. Plantar responses are extensor bilaterally  SENSORY: Mild decreased vibratory sensation  at toes.  COORDINATION: There is no trunk or limb dysmetria noted.  GAIT/STANCE: She needs to push up to get up from seated position, wide based, stiff, cautious, unsteady  ALLERGIES: Allergies  Allergen Reactions  . Codeine     Pt states she has tolerated norco in the past  . Fentanyl Other (See Comments)    Pt reports having hallucinations.  . Morphine And Related     Patient's son states pt feels "looping" with morphine      HOME MEDICATIONS: Current Outpatient Medications  Medication Sig Dispense Refill  . acetaminophen (TYLENOL) 500 MG tablet Take 1,000 mg by mouth every 8 (eight) hours as needed.    . ALPRAZolam (XANAX) 0.25 MG tablet Take 1 tablet (0.25 mg total) by mouth 2 (two) times daily as needed for anxiety. 60 tablet 0  . ALPRAZolam (XANAX) 0.5 MG tablet Take 1-2 tablets 30 minutes prior to MRI, may repeat once as needed. Must have driver. 3 tablet 0  . amLODipine (NORVASC) 10 MG tablet Take 1 tablet by mouth daily.    Marland Kitchen atenolol (TENORMIN) 50 MG tablet Take 1 tablet by mouth daily.    Marland Kitchen atorvastatin (LIPITOR) 20 MG tablet Take 20 mg by mouth daily.    . busPIRone (BUSPAR) 5 MG tablet Take 1 tablet by mouth at bedtime.     Marland Kitchen CLARITIN 10 MG tablet Take 10 mg by mouth every morning.    Marland Kitchen FLUoxetine (PROZAC) 10 MG capsule Take 1 capsule by mouth daily.    . mirtazapine (REMERON) 15 MG tablet Take 7.5 mg by mouth at bedtime.     . Multiple Vitamin (MULTIVITAMIN WITH MINERALS) TABS tablet Take 1 tablet by mouth daily.    . sertraline (ZOLOFT) 25 MG tablet Take 1 tablet by mouth daily.    . traMADol (ULTRAM) 50 MG tablet Take one tablet by mouth every 6 hours as needed for moderate pain; Take two tablets by mouth every 6 hours as needed for severe pain 240 tablet 0   No current facility-administered medications for this visit.    PAST MEDICAL HISTORY:  Past Medical History:  Diagnosis Date  . Anxiety and depression   . Closed left femoral fracture ([place])   . History of vertebral fracture    from fall in September 2016  . Hypertension   . Insomnia   . Macular degeneration   . Memory loss   . Solitary kidney     PAST SURGICAL HISTORY: Past Surgical History:  Procedure Laterality Date  . ABDOMINAL HYSTERECTOMY    . BLADDER SUSPENSION    . BREAST SURGERY    . CATARACT EXTRACTION, BILATERAL    . EYE SURGERY    . GALLBLADDER SURGERY    . JOINT REPLACEMENT    . NASAL SEPTUM SURGERY    .  RECONSTRUCTION OF EYELID    . RECTOCELE REPAIR    . TOTAL HIP ARTHROPLASTY Left 07/17/2016   Procedure: TOTAL HIP ARTHROPLASTY ANTERIOR APPROACH;  Surgeon: Leandrew Koyanagi, MD;  Location: [facility];  Service: Orthopedics;  Laterality: Left;    FAMILY HISTORY: Family History  Problem Relation Age of Onset  . Heart disease Mother   . Heart attack Father   . Heart disease Brother   . Heart disease Brother     SOCIAL HISTORY: Social History   Socioeconomic History  . Marital status: Widowed    Spouse name: Not on file  . Number of children: 2  . Years of education: 69  . Highest education level: Not on file  Occupational History  . Occupation: retired  Tobacco Use  . Smoking status: Never Smoker  . Smokeless tobacco: Never Used  Vaping Use  . Vaping Use: Never used  Substance and Sexual Activity  . Alcohol use: Not Currently  . Drug use: No  . Sexual activity: Not on file  Other Topics Concern  . Not on file  Social History Narrative   Lives at home alone.   Right-handed.   Caffeine use: one cup coffee.   Social Determinants of Health   Financial Resource Strain: Not on file  Food Insecurity: Not on file  Transportation Needs: Not on file  Physical Activity: Not on file  Stress: Not on file  Social Connections: Not on file  Intimate Partner Violence: Not on file      Marcial Pacas, M.D. Ph.D.  [facility] [address] [place], [place] [address] Ph: [phone] Fax: [phone]  CC:  Buzzy Han, [place],  [place] [id number]  Buzzy Han, MD

## 2021-05-01 NOTE — Procedures (Signed)
Full Name: Emily Escobar Gender: Female MRN #: [id number] Date of Birth: [birth year]    Visit Date: 05/01/2021 10:28 Age: 85 Years Examining Physician: Levert Feinstein, MD  Referring Physician: Levert Feinstein, MD History: 85 year old female presented with gait abnormality, slurred speech, hyperreflexia,  Summary of the test:  Nerve Conduction study: Bilateral sural, superficial peroneal sensory responses were normal.  Bilateral tibial, peroneal to EDB motor responses were normal.  Right median, ulnar sensory and motor responses were normal.  Electromyography: Selected needle examination of right upper, lower extremity muscles, right cervical and lumbosacral paraspinal muscles were normal.   Conclusion: This is a normal study.  There is no electrodiagnostic evidence of large fiber peripheral neuropathy, right cervical or lumbosacral radiculopathy.    ------------------------------- Levert Feinstein, M.D. PhD  [facility] [address], [address] [place], [place] [address] Tel: [phone] Fax: [phone]  Verbal informed consent was obtained from the patient, patient was informed of potential risk of procedure, including bruising, bleeding, hematoma formation, infection, muscle weakness, muscle pain, numbness, among others.        MNC    Nerve / Sites Muscle Latency Ref. Amplitude Ref. Rel Amp Segments Distance Velocity Ref. Area    ms ms mV mV %  cm m/s m/s mVms  R Median - APB     Wrist APB 3.4 ?4.4 4.6 ?4.0 100 Wrist - APB 7   9.5     Upper arm APB 7.4  3.5  75.8 Upper arm - Wrist 21 54 ?49 7.6  R Ulnar - ADM     Wrist ADM 3.1 ?3.3 10.2 ?6.0 100 Wrist - ADM 7   27.4     B.Elbow ADM 6.3  9.2  90.3 B.Elbow - Wrist 18 57 ?49 27.3     A.Elbow ADM 8.0  8.9  96.8 A.Elbow - B.Elbow 10 56 ?49 26.7  R Peroneal - EDB     Ankle EDB 5.0 ?6.5 3.5 ?2.0 100 Ankle - EDB 9   10.5     Fib head EDB 10.9  2.2  62.3 Fib head - Ankle 27 46 ?44 7.7     Pop fossa EDB 13.2  3.3   150 Pop fossa - Fib head 10 45 ?44 10.4         Pop fossa - Ankle      L Peroneal - EDB     Ankle EDB 5.4 ?6.5 3.4 ?2.0 100 Ankle - EDB 9   12.2     Fib head EDB 11.1  3.3  97.7 Fib head - Ankle 27 47 ?44 12.1     Pop fossa EDB 13.3  2.2  66.9 Pop fossa - Fib head 10 44 ?44 8.0         Pop fossa - Ankle      R Tibial - AH     Ankle AH 4.5 ?5.8 8.9 ?4.0 100 Ankle - AH 9   18.2     Pop fossa AH 13.2  7.9  88.7 Pop fossa - Ankle 38 44 ?41 14.4  L Tibial - AH     Ankle AH 5.0 ?5.8 10.8 ?4.0 100 Ankle - AH 9   17.6     Pop fossa AH 13.3  7.1  65.6 Pop fossa - Ankle 38 45 ?41 15.2                    SNC    Nerve / Sites Rec. Site  Peak Lat Ref.  Amp Ref. Segments Distance    ms ms V V  cm  R Sural - Ankle (Calf)     Calf Ankle 4.3 ?4.4 8 ?6 Calf - Ankle 14  L Sural - Ankle (Calf)     Calf Ankle 4.3 ?4.4 9 ?6 Calf - Ankle 14  R Superficial peroneal - Ankle     Lat leg Ankle 3.8 ?4.4 6 ?6 Lat leg - Ankle 14  L Superficial peroneal - Ankle     Lat leg Ankle 3.8 ?4.4 6 ?6 Lat leg - Ankle 14  R Median - Orthodromic (Dig II, Mid palm)     Dig II Wrist 3.3 ?3.4 11 ?10 Dig II - Wrist 13  R Ulnar - Orthodromic, (Dig V, Mid palm)     Dig V Wrist 2.9 ?3.1 7 ?5 Dig V - Wrist 75                 F  Wave    Nerve F Lat Ref.   ms ms  R Tibial - AH 52.9 ?56.0  L Tibial - AH 54.8 ?56.0  R Ulnar - ADM 25.7 ?32.0           EMG Summary Table    Spontaneous MUAP Recruitment  Muscle IA Fib PSW Fasc Other Amp Dur. Poly Pattern  R. Tibialis anterior Normal None None None _______ Normal Normal Normal Normal  R. Tibialis posterior Normal None None None _______ Normal Normal Normal Normal  R. Peroneus longus Normal None None None _______ Normal Normal Normal Normal  R. Gastrocnemius (Medial head) Normal None None None _______ Normal Normal Normal Normal  R. Vastus lateralis Normal None None None _______ Normal Normal Normal Normal  R. Lumbar paraspinals (low) Normal None None None _______ Normal  Normal Normal Normal  R. Lumbar paraspinals (mid) Normal None None None _______ Normal Normal Normal Normal  R. First dorsal interosseous Normal None None None _______ Normal Normal Normal Normal  R. Pronator teres Normal None None None _______ Normal Normal Normal Normal  R. Biceps brachii Normal None None None _______ Normal Normal Normal Normal  R. Deltoid Normal None None None _______ Normal Normal Normal Normal  R. Triceps brachii Normal None None None _______ Normal Normal Normal Normal  R. Brachioradialis Normal None None None _______ Normal Normal Normal Normal  R. Cervical paraspinals Normal None None None _______ Normal Normal Normal Normal

## 2021-05-16 ENCOUNTER — Encounter (INDEPENDENT_AMBULATORY_CARE_PROVIDER_SITE_OTHER): Payer: Self-pay | Admitting: Ophthalmology

## 2021-05-16 ENCOUNTER — Ambulatory Visit (INDEPENDENT_AMBULATORY_CARE_PROVIDER_SITE_OTHER): Payer: Medicare Other | Admitting: Ophthalmology

## 2021-05-16 ENCOUNTER — Other Ambulatory Visit: Payer: Self-pay

## 2021-05-16 DIAGNOSIS — H0231 Blepharochalasis right upper eyelid: Secondary | ICD-10-CM

## 2021-05-16 DIAGNOSIS — H0234 Blepharochalasis left upper eyelid: Secondary | ICD-10-CM | POA: Diagnosis not present

## 2021-05-16 DIAGNOSIS — H353211 Exudative age-related macular degeneration, right eye, with active choroidal neovascularization: Secondary | ICD-10-CM | POA: Diagnosis not present

## 2021-05-16 MED ORDER — BEVACIZUMAB 2.5 MG/0.1ML IZ SOSY
2.5000 mg | PREFILLED_SYRINGE | INTRAVITREAL | Status: AC | PRN
  Administered 2021-05-16: 2.5 mg via INTRAVITREAL

## 2021-05-16 NOTE — Assessment & Plan Note (Signed)
Today at 6-week interval, chronic subretinal fluid slightly increased.  We will repeat injection Avastin today to control and prevent visual acuity decline.  Follow-up next visit in 5 weeks

## 2021-05-16 NOTE — Progress Notes (Signed)
05/16/2021     CHIEF COMPLAINT Patient presents for Retina Follow Up (6 week fu OD and Avastin OD/Pt states, "My va has definitely decreased over the last couple of weeks. I am seeing spots in my vision, OD"/)   HISTORY OF PRESENT ILLNESS: Emily Escobar is a 85 y.o. female who presents to the clinic today for:   HPI    Retina Follow Up    Diagnosis: Wet AMD   Laterality: right eye   Onset: 6 weeks ago   Severity: mild   Duration: 6 weeks   Course: gradually worsening   Comments: 6 week fu OD and Avastin OD Pt states, "My va has definitely decreased over the last couple of weeks. I am seeing spots in my vision, OD"        Last edited by Demetrios Loll, COA on 05/16/2021  2:53 PM. (History)      Referring physician: Margot Ables, MD [address] [place],  [place] [address]  HISTORICAL INFORMATION:   Selected notes from the MEDICAL RECORD NUMBER    Lab Results  Component Value Date   HGBA1C 5.6 04/02/2021     CURRENT MEDICATIONS: No current outpatient medications on file. (Ophthalmic Drugs)   No current facility-administered medications for this visit. (Ophthalmic Drugs)   Current Outpatient Medications (Other)  Medication Sig  . acetaminophen (TYLENOL) 500 MG tablet Take 1,000 mg by mouth every 8 (eight) hours as needed.  . ALPRAZolam (XANAX) 0.25 MG tablet Take 1 tablet (0.25 mg total) by mouth 2 (two) times daily as needed for anxiety.  . ALPRAZolam (XANAX) 0.5 MG tablet Take 1-2 tablets 30 minutes prior to MRI, may repeat once as needed. Must have driver.  Marland Kitchen amLODipine (NORVASC) 10 MG tablet Take 1 tablet by mouth daily.  Marland Kitchen atenolol (TENORMIN) 50 MG tablet Take 1 tablet by mouth daily.  Marland Kitchen atorvastatin (LIPITOR) 20 MG tablet Take 20 mg by mouth daily.  . busPIRone (BUSPAR) 5 MG tablet Take 1 tablet by mouth at bedtime.   Marland Kitchen CLARITIN 10 MG tablet Take 10 mg by mouth every morning.  Marland Kitchen FLUoxetine (PROZAC) 10 MG capsule Take 1 capsule by mouth daily.  .  mirtazapine (REMERON) 15 MG tablet Take 7.5 mg by mouth at bedtime.   . Multiple Vitamin (MULTIVITAMIN WITH MINERALS) TABS tablet Take 1 tablet by mouth daily.  . sertraline (ZOLOFT) 25 MG tablet Take 1 tablet by mouth daily.  . traMADol (ULTRAM) 50 MG tablet Take one tablet by mouth every 6 hours as needed for moderate pain; Take two tablets by mouth every 6 hours as needed for severe pain   No current facility-administered medications for this visit. (Other)      REVIEW OF SYSTEMS:    ALLERGIES Allergies  Allergen Reactions  . Codeine     Pt states she has tolerated norco in the past  . Fentanyl Other (See Comments)    Pt reports having hallucinations.  . Morphine And Related     Patient's son states pt feels "looping" with morphine     PAST MEDICAL HISTORY Past Medical History:  Diagnosis Date  . Anxiety and depression   . Closed left femoral fracture (HCC)   . History of vertebral fracture    from fall in September 2016  . Hypertension   . Insomnia   . Macular degeneration   . Memory loss   . Solitary kidney    Past Surgical History:  Procedure Laterality Date  .  ABDOMINAL HYSTERECTOMY    . BLADDER SUSPENSION    . BREAST SURGERY    . CATARACT EXTRACTION, BILATERAL    . EYE SURGERY    . GALLBLADDER SURGERY    . JOINT REPLACEMENT    . NASAL SEPTUM SURGERY    . RECONSTRUCTION OF EYELID    . RECTOCELE REPAIR    . TOTAL HIP ARTHROPLASTY Left 07/17/2016   Procedure: TOTAL HIP ARTHROPLASTY ANTERIOR APPROACH;  Surgeon: Tarry KosNaiping M Xu, MD;  Location: [facility] OR;  Service: Orthopedics;  Laterality: Left;    FAMILY HISTORY Family History  Problem Relation Age of Onset  . Heart disease Mother   . Heart attack Father   . Heart disease Brother   . Heart disease Brother     SOCIAL HISTORY Social History   Tobacco Use  . Smoking status: Never Smoker  . Smokeless tobacco: Never Used  Vaping Use  . Vaping Use: Never used  Substance Use Topics  . Alcohol use: Not  Currently  . Drug use: No         OPHTHALMIC EXAM: Base Eye Exam    Visual Acuity (ETDRS)      Right Left   Dist [place] 20/200 20/60 -1   Dist ph [place] 20/100 -2 20/40       Tonometry (Tonopen, 2:57 PM)      Right Left   Pressure 16 13       Pupils      Pupils Dark Light Shape React APD   Right PERRL 6 5 Round Brisk None   Left PERRL 6 5 Round Brisk None       Visual Fields (Counting fingers)      Left Right    Full Full       Extraocular Movement      Right Left    Full Full       Neuro/Psych    Oriented x3: Yes   Mood/Affect: Normal       Dilation    Right eye: 1.0% Mydriacyl, 2.5% Phenylephrine @ 2:57 PM        Slit Lamp and Fundus Exam    External Exam      Right Left   External Normal Normal       Slit Lamp Exam      Right Left   Lids/Lashes Normal Normal   Conjunctiva/Sclera White and quiet White and quiet   Cornea Clear Clear   Anterior Chamber Deep and quiet Deep and quiet   Iris Round and reactive Round and reactive   Lens Centered posterior chamber intraocular lens Centered posterior chamber intraocular lens   Anterior Vitreous Normal Normal       Fundus Exam      Right Left   Posterior Vitreous Normal    Disc Normal    C/D Ratio 0.1    Macula Macular thickening with srf, Drusen, Hard drusen, Intermediate age related macular degeneration, Retinal pigment epithelial mottling    Vessels Normal    Periphery Normal           IMAGING AND PROCEDURES  Imaging and Procedures for 05/16/21  OCT, Retina - OU - Both Eyes       Right Eye Quality was good. Scan locations included subfoveal. Central Foveal Thickness: 306. Progression has been stable. Findings include abnormal foveal contour, subretinal fluid, choroidal neovascular membrane.   Left Eye Quality was good. Scan locations included subfoveal. Central Foveal Thickness: 287. Progression has been stable. Findings include abnormal foveal  contour, no SRF.   Notes Persistent yet  improving subretinal fluid OD, from CNVM, will repeat injection today at 6-week interval and examination again In 5 weeks, with persistent subretinal fluid OD in a foveal location         Intravitreal Injection, Pharmacologic Agent - OD - Right Eye       Time Out 05/16/2021. 3:47 PM. Confirmed correct patient, procedure, site, and patient consented.   Anesthesia Topical anesthesia was used. Anesthetic medications included Akten 3.5%.   Procedure Preparation included 10% betadine to eyelids, 5% betadine to ocular surface, Ofloxacin . A 30 gauge needle was used.   Injection:  2.5 mg Bevacizumab (AVASTIN) 2.5mg /0.44mL SOSY   NDC: 71696-789-38, Lot: [id number]   Route: Intravitreal, Site: Right Eye  Post-op Post injection exam found visual acuity of at least counting fingers. The patient tolerated the procedure well. There were no complications. The patient received written and verbal post procedure care education. Post injection medications were not given.                 ASSESSMENT/PLAN:  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 choroidal neovascularization (HCC) Today at 6-week interval, chronic subretinal fluid slightly increased.  We will repeat injection Avastin today to control and prevent visual acuity decline.  Follow-up next visit in 5 weeks      ICD-10-CM   1. 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 choroidal neovascularization (HCC)  H35.3211 OCT, Retina - OU - Both Eyes    Intravitreal Injection, Pharmacologic Agent - OD - Right Eye    bevacizumab (AVASTIN) SOSY 2.5 mg  2. Blepharochalasis of both upper eyelids  H02.31    H02.34     1.  At 6-week intervals slightly increased subretinal fluid serous retinal detachment associated with AMD OD.  Repeat injection Avastin today shorten interval follow-up to 5 weeks  2.  Patient is scheduled eyelid procedure with Dr. Dimas Millin in the coming  months.  3.  Ophthalmic Meds Ordered this visit:  Meds ordered this encounter  Medications  . bevacizumab (AVASTIN) SOSY 2.5 mg       Return in about 5 weeks (around 06/20/2021) for dilate, OD, AVASTIN OCT.  There are no Patient Instructions on file for this visit.   Explained the diagnoses, plan, and follow up with the patient and they expressed understanding.  Patient expressed understanding of the importance of proper follow up care.   Alford Highland Daylen Hack M.D. Diseases & Surgery of the Retina and Vitreous [facility] 05/16/21     Abbreviations: M myopia (nearsighted); A astigmatism; H hyperopia (farsighted); P presbyopia; Mrx spectacle prescription;  CTL contact lenses; OD right eye; OS left eye; OU both eyes  XT exotropia; ET esotropia; PEK punctate epithelial keratitis; PEE punctate epithelial erosions; DES dry eye syndrome; MGD meibomian gland dysfunction; ATs artificial tears; PFAT's preservative free artificial tears; NSC nuclear sclerotic cataract; PSC posterior subcapsular cataract; ERM epi-retinal membrane; PVD posterior vitreous detachment; RD retinal detachment; DM diabetes mellitus; DR diabetic retinopathy; NPDR non-proliferative diabetic retinopathy; PDR proliferative diabetic retinopathy; CSME clinically significant macular edema; DME diabetic macular edema; dbh dot blot hemorrhages; CWS cotton wool spot; POAG primary open angle glaucoma; C/D cup-to-disc ratio; HVF humphrey visual field; GVF goldmann visual field; OCT optical coherence tomography; IOP intraocular pressure; BRVO Branch retinal vein occlusion; CRVO central retinal vein occlusion; CRAO central retinal artery occlusion; BRAO branch retinal artery occlusion; RT retinal tear; SB scleral buckle; PPV pars plana vitrectomy;  VH Vitreous hemorrhage; PRP panretinal laser photocoagulation; IVK intravitreal kenalog; VMT vitreomacular traction; MH Macular hole;  NVD neovascularization of the disc; NVE  neovascularization elsewhere; AREDS age related eye disease study; ARMD age related macular degeneration; POAG primary open angle glaucoma; EBMD epithelial/anterior basement membrane dystrophy; ACIOL anterior chamber intraocular lens; IOL intraocular lens; PCIOL posterior chamber intraocular lens; Phaco/IOL phacoemulsification with intraocular lens placement; PRK photorefractive keratectomy; LASIK laser assisted in situ keratomileusis; HTN hypertension; DM diabetes mellitus; COPD chronic obstructive pulmonary disease

## 2021-06-20 ENCOUNTER — Other Ambulatory Visit: Payer: Self-pay

## 2021-06-20 ENCOUNTER — Ambulatory Visit (INDEPENDENT_AMBULATORY_CARE_PROVIDER_SITE_OTHER): Payer: Medicare Other | Admitting: Ophthalmology

## 2021-06-20 ENCOUNTER — Encounter (INDEPENDENT_AMBULATORY_CARE_PROVIDER_SITE_OTHER): Payer: Self-pay | Admitting: Ophthalmology

## 2021-06-20 DIAGNOSIS — H353211 Exudative age-related macular degeneration, right eye, with active choroidal neovascularization: Secondary | ICD-10-CM

## 2021-06-20 MED ORDER — BEVACIZUMAB 2.5 MG/0.1ML IZ SOSY
2.5000 mg | PREFILLED_SYRINGE | INTRAVITREAL | Status: AC | PRN
  Administered 2021-06-20: 2.5 mg via INTRAVITREAL

## 2021-06-20 NOTE — Progress Notes (Signed)
06/20/2021     CHIEF COMPLAINT Patient presents for Retina Follow Up (5 week fu OD and Avastin OD/Pt states, "My vision has gone down some. I see spots at night in my OD."/)   HISTORY OF PRESENT ILLNESS: Emily Escobar is a 85 y.o. female who presents to the clinic today for:   HPI     Retina Follow Up           Diagnosis: Wet AMD   Laterality: right eye   Onset: 5 weeks ago   Severity: mild   Duration: 5 weeks   Course: gradually worsening   Comments: 5 week fu OD and Avastin OD Pt states, "My vision has gone down some. I see spots at night in my OD."        Last edited by Demetrios Loll, COA on 06/20/2021  2:16 PM.      Referring physician: Margot Ables, MD [address] [place],  [place] [address]  HISTORICAL INFORMATION:   Selected notes from the MEDICAL RECORD NUMBER    Lab Results  Component Value Date   HGBA1C 5.6 04/02/2021     CURRENT MEDICATIONS: No current outpatient medications on file. (Ophthalmic Drugs)   No current facility-administered medications for this visit. (Ophthalmic Drugs)   Current Outpatient Medications (Other)  Medication Sig   acetaminophen (TYLENOL) 500 MG tablet Take 1,000 mg by mouth every 8 (eight) hours as needed.   ALPRAZolam (XANAX) 0.25 MG tablet Take 1 tablet (0.25 mg total) by mouth 2 (two) times daily as needed for anxiety.   ALPRAZolam (XANAX) 0.5 MG tablet Take 1-2 tablets 30 minutes prior to MRI, may repeat once as needed. Must have driver.   amLODipine (NORVASC) 10 MG tablet Take 1 tablet by mouth daily.   atenolol (TENORMIN) 50 MG tablet Take 1 tablet by mouth daily.   atorvastatin (LIPITOR) 20 MG tablet Take 20 mg by mouth daily.   busPIRone (BUSPAR) 5 MG tablet Take 1 tablet by mouth at bedtime.    CLARITIN 10 MG tablet Take 10 mg by mouth every morning.   FLUoxetine (PROZAC) 10 MG capsule Take 1 capsule by mouth daily.   mirtazapine (REMERON) 15 MG tablet Take 7.5 mg by mouth at bedtime.    Multiple  Vitamin (MULTIVITAMIN WITH MINERALS) TABS tablet Take 1 tablet by mouth daily.   sertraline (ZOLOFT) 25 MG tablet Take 1 tablet by mouth daily.   traMADol (ULTRAM) 50 MG tablet Take one tablet by mouth every 6 hours as needed for moderate pain; Take two tablets by mouth every 6 hours as needed for severe pain   No current facility-administered medications for this visit. (Other)      REVIEW OF SYSTEMS:    ALLERGIES Allergies  Allergen Reactions   Codeine     Pt states she has tolerated norco in the past   Fentanyl Other (See Comments)    Pt reports having hallucinations.   Morphine And Related     Patient's son states pt feels "looping" with morphine     PAST MEDICAL HISTORY Past Medical History:  Diagnosis Date   Anxiety and depression    Closed left femoral fracture (HCC)    History of vertebral fracture    from fall in September 2016   Hypertension    Insomnia    Macular degeneration    Memory loss    Solitary kidney    Past Surgical History:  Procedure Laterality Date   ABDOMINAL HYSTERECTOMY  BLADDER SUSPENSION     BREAST SURGERY     CATARACT EXTRACTION, BILATERAL     EYE SURGERY     GALLBLADDER SURGERY     JOINT REPLACEMENT     NASAL SEPTUM SURGERY     RECONSTRUCTION OF EYELID     RECTOCELE REPAIR     TOTAL HIP ARTHROPLASTY Left 07/17/2016   Procedure: TOTAL HIP ARTHROPLASTY ANTERIOR APPROACH;  Surgeon: Tarry Kos, MD;  Location: [facility] OR;  Service: Orthopedics;  Laterality: Left;    FAMILY HISTORY Family History  Problem Relation Age of Onset   Heart disease Mother    Heart attack Father    Heart disease Brother    Heart disease Brother     SOCIAL HISTORY Social History   Tobacco Use   Smoking status: Never   Smokeless tobacco: Never  Vaping Use   Vaping Use: Never used  Substance Use Topics   Alcohol use: Not Currently   Drug use: No         OPHTHALMIC EXAM:  Base Eye Exam     Visual Acuity (ETDRS)       Right Left   Dist  cc 20/100 20/40 -2   Dist ph cc NI 20/40    Correction: Glasses         Tonometry (Tonopen, 2:18 PM)       Right Left   Pressure 15 15         Pupils       Pupils Dark Light Shape React APD   Right PERRL 6 5 Round Brisk None   Left PERRL 6 5 Round Brisk None         Visual Fields (Counting fingers)       Left Right    Full Full         Extraocular Movement       Right Left    Full Full         Neuro/Psych     Oriented x3: Yes   Mood/Affect: Normal         Dilation     Right eye: 1.0% Mydriacyl, 2.5% Phenylephrine @ 2:18 PM           Slit Lamp and Fundus Exam     External Exam       Right Left   External Normal Normal         Slit Lamp Exam       Right Left   Lids/Lashes Normal Normal   Conjunctiva/Sclera White and quiet White and quiet   Cornea Clear Clear   Anterior Chamber Deep and quiet Deep and quiet   Iris Round and reactive Round and reactive   Lens Centered posterior chamber intraocular lens Centered posterior chamber intraocular lens   Anterior Vitreous Normal Normal         Fundus Exam       Right Left   Posterior Vitreous Normal    Disc Normal    C/D Ratio 0.1    Macula Macular thickening with srf, Drusen, Hard drusen, Intermediate age related macular degeneration, Retinal pigment epithelial mottling    Vessels Normal    Periphery Normal             IMAGING AND PROCEDURES  Imaging and Procedures for 06/20/21  OCT, Retina - OU - Both Eyes       Right Eye Quality was good. Scan locations included subfoveal. Central Foveal Thickness: 290. Progression has been stable. Findings include abnormal  foveal contour, subretinal fluid, choroidal neovascular membrane.   Left Eye Quality was good. Scan locations included subfoveal. Central Foveal Thickness: 285. Progression has been stable. Findings include abnormal foveal contour, no SRF.   Notes Persistent yet improving subretinal fluid OD, from CNVM, will  repeat injection today at 6-week interval and examination again in 7 weeks     Intravitreal Injection, Pharmacologic Agent - OD - Right Eye       Time Out 06/20/2021. 2:57 PM. Confirmed correct patient, procedure, site, and patient consented.   Anesthesia Topical anesthesia was used. Anesthetic medications included Akten 3.5%.   Procedure Preparation included 10% betadine to eyelids, 5% betadine to ocular surface, Ofloxacin . A 30 gauge needle was used.   Injection: 2.5 mg bevacizumab 2.5 MG/0.1ML   Route: Intravitreal, Site: Right Eye   NDC: [phone], Lot: [id number]   Post-op Post injection exam found visual acuity of at least counting fingers. The patient tolerated the procedure well. There were no complications. The patient received written and verbal post procedure care education. Post injection medications were not given.              ASSESSMENT/PLAN:  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 choroidal neovascularization (HCC) The nature of wet macular degeneration was discussed with the patient.  Forms of therapy reviewed include the use of Anti-VEGF medications injected painlessly into the eye, as well as other possible treatment modalities, including thermal laser therapy. Fellow eye involvement and risks were discussed with the patient. Upon the finding of wet age related macular degeneration, treatment will be offered. The treatment regimen is on a treat as needed basis with the intent to treat if necessary and extend interval of exams when possible. On average 1 out of 6 patients do not need lifetime therapy. However, the risk of recurrent disease is high for a lifetime.  Initially monthly, then periodic, examinations and evaluations will determine whether the next treatment is required on the day of the examination.  OD chronically active with serous retinal detachment currently at follow-up at 5-week interval with less subretinal fluid.   Patient has eyelid surgery scheduled in the early part of May thus we will have her return 2 weeks later at 7 weeks from now     ICD-10-CM   1. 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 choroidal neovascularization (HCC)  H35.3211 OCT, Retina - OU - Both Eyes    Intravitreal Injection, Pharmacologic Agent - OD - Right Eye    bevacizumab (AVASTIN) SOSY 2.5 mg      1.  Chronic active subretinal fluid at 5-week interval nonetheless stable with good acuity OD.  At 5-week interval today.  We will repeat injection today to maintain.  The patient has asked for ask for delayed follow-up next visit as she is having eyelid surgery in 4 weeks  2.  Dilate OD next in 7 weeks  3.  Ophthalmic Meds Ordered this visit:  Meds ordered this encounter  Medications   bevacizumab (AVASTIN) SOSY 2.5 mg       Return in about 7 weeks (around 08/08/2021) for dilate, OD, AVASTIN OCT.  There are no Patient Instructions on file for this visit.   Explained the diagnoses, plan, and follow up with the patient and they expressed understanding.  Patient expressed understanding of the importance of proper follow up care.   Alford Highland Kruti Horacek M.D. Diseases & Surgery of the Retina and Vitreous [facility] 06/20/21  Abbreviations: M myopia (nearsighted); A astigmatism; H hyperopia (farsighted); P presbyopia; Mrx spectacle prescription;  CTL contact lenses; OD right eye; OS left eye; OU both eyes  XT exotropia; ET esotropia; PEK punctate epithelial keratitis; PEE punctate epithelial erosions; DES dry eye syndrome; MGD meibomian gland dysfunction; ATs artificial tears; PFAT's preservative free artificial tears; [place] nuclear sclerotic cataract; PSC posterior subcapsular cataract; ERM epi-retinal membrane; PVD posterior vitreous detachment; RD retinal detachment; DM diabetes mellitus; DR diabetic retinopathy; NPDR non-proliferative diabetic retinopathy; PDR proliferative diabetic  retinopathy; CSME clinically significant macular edema; DME diabetic macular edema; dbh dot blot hemorrhages; CWS cotton wool spot; POAG primary open angle glaucoma; C/D cup-to-disc ratio; HVF humphrey visual field; GVF goldmann visual field; OCT optical coherence tomography; IOP intraocular pressure; BRVO Branch retinal vein occlusion; CRVO central retinal vein occlusion; CRAO central retinal artery occlusion; BRAO branch retinal artery occlusion; RT retinal tear; SB scleral buckle; PPV pars plana vitrectomy; VH Vitreous hemorrhage; PRP panretinal laser photocoagulation; IVK intravitreal kenalog; VMT vitreomacular traction; MH Macular hole;  NVD neovascularization of the disc; NVE neovascularization elsewhere; AREDS age related eye disease study; ARMD age related macular degeneration; POAG primary open angle glaucoma; EBMD epithelial/anterior basement membrane dystrophy; ACIOL anterior chamber intraocular lens; IOL intraocular lens; PCIOL posterior chamber intraocular lens; Phaco/IOL phacoemulsification with intraocular lens placement; [place] photorefractive keratectomy; LASIK laser assisted in situ keratomileusis; HTN hypertension; DM diabetes mellitus; COPD chronic obstructive pulmonary disease

## 2021-06-20 NOTE — Assessment & Plan Note (Signed)
The nature of wet macular degeneration was discussed with the patient.  Forms of therapy reviewed include the use of Anti-VEGF medications injected painlessly into the eye, as well as other possible treatment modalities, including thermal laser therapy. Fellow eye involvement and risks were discussed with the patient. Upon the finding of wet age related macular degeneration, treatment will be offered. The treatment regimen is on a treat as needed basis with the intent to treat if necessary and extend interval of exams when possible. On average 1 out of 6 patients do not need lifetime therapy. However, the risk of recurrent disease is high for a lifetime.  Initially monthly, then periodic, examinations and evaluations will determine whether the next treatment is required on the day of the examination.  OD chronically active with serous retinal detachment currently at follow-up at 5-week interval with less subretinal fluid.  Patient has eyelid surgery scheduled in the early part of May thus we will have her return 2 weeks later at 7 weeks from now

## 2021-08-01 IMAGING — CT CT HEAD W/O CM
1 series · 16 of 30 positions shown, 20 images · non-contrast
Comparison: None.

CLINICAL DATA: Frequent falls

EXAM:
CT HEAD WITHOUT CONTRAST
TECHNIQUE: Contiguous axial images were obtained from the base of the skull
through the vertex without intravenous contrast.

[Series 2: head w/(date) · axial · 0.53mm/px · z∈[+1021,+1156]mm · 16 of 31 slices shown, 20 images]
[im 2/31  brain]
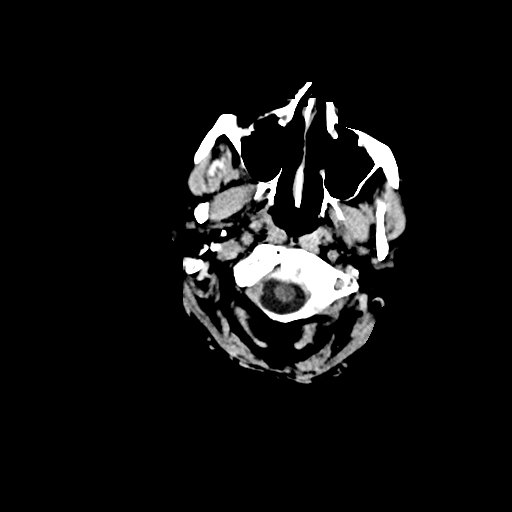
[im 2/31  bone]
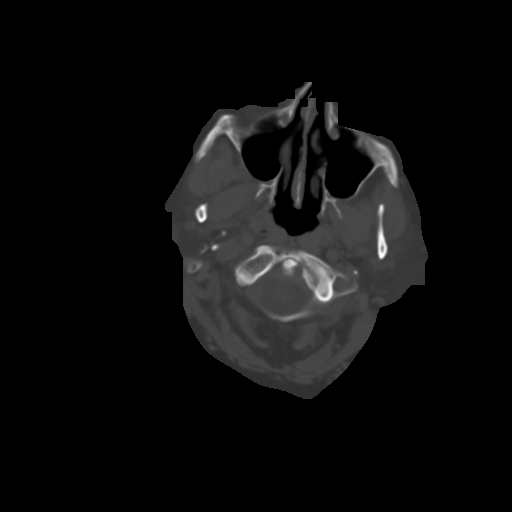
[im 4/31  brain]
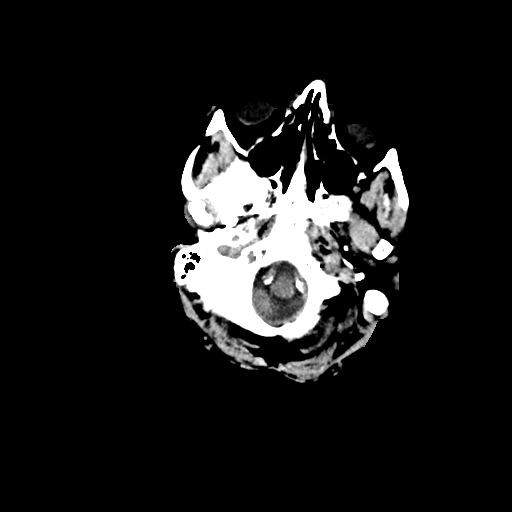
[im 6/31  brain]
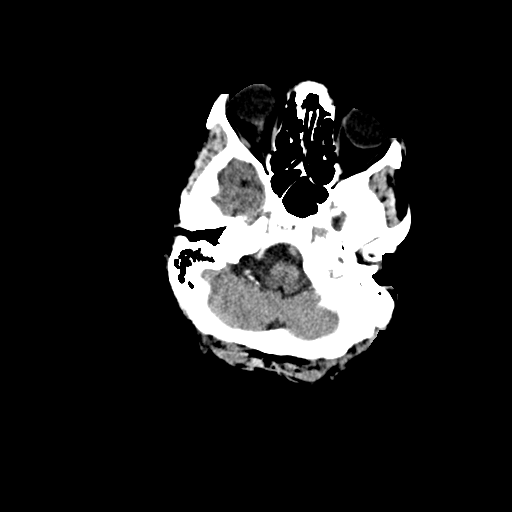
[im 8/31  brain]
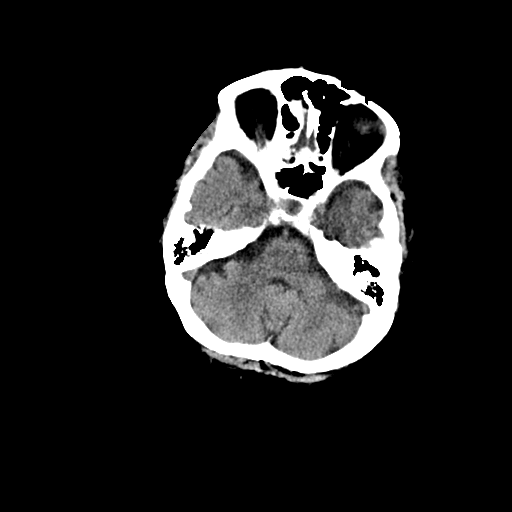
[im 9/31  brain]
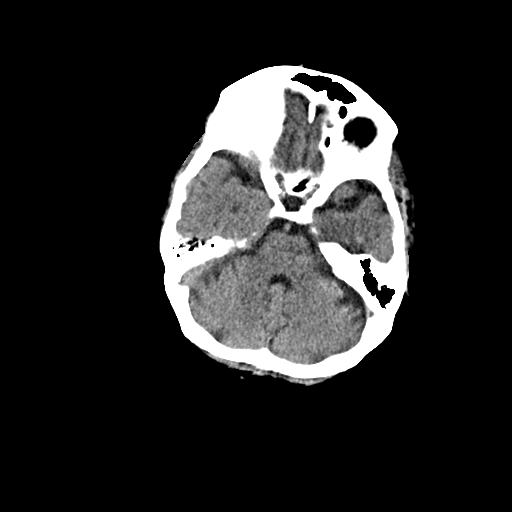
[im 9/31  bone]
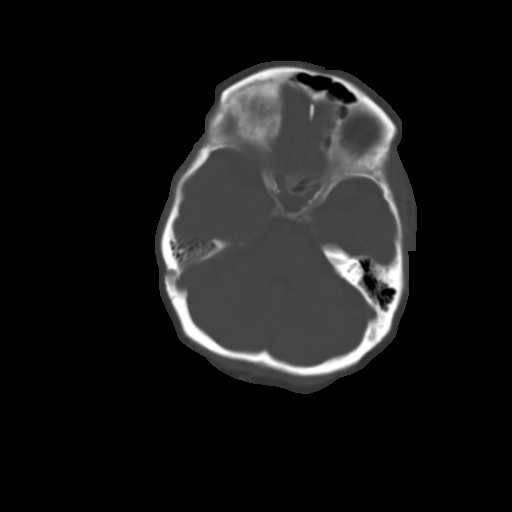
[im 11/31  brain]
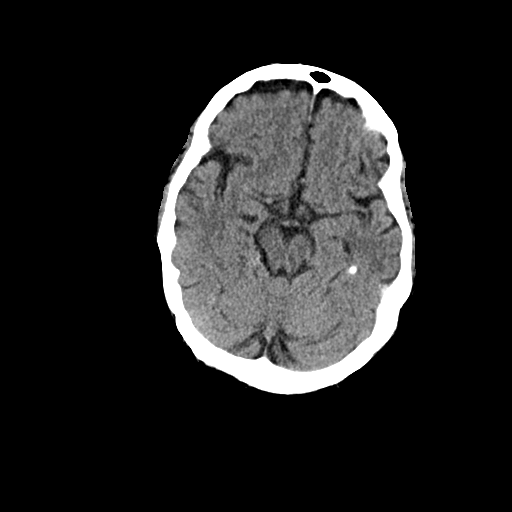
[im 13/31  brain]
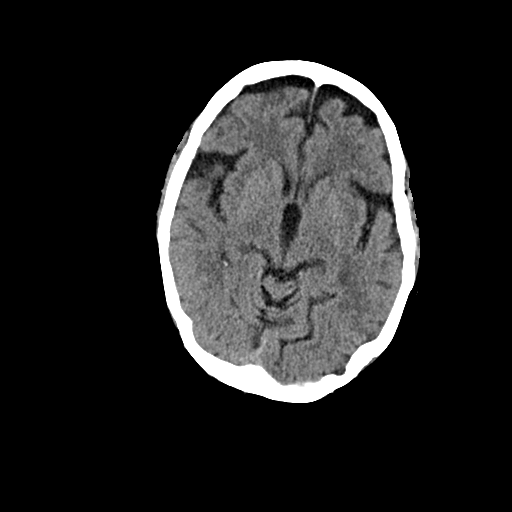
[im 15/31  brain]
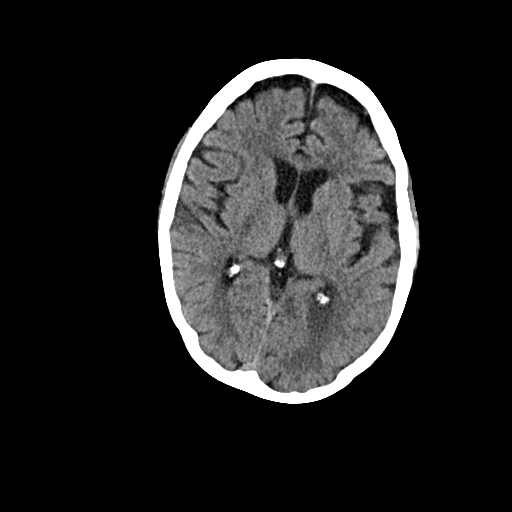
[im 16/31  brain]
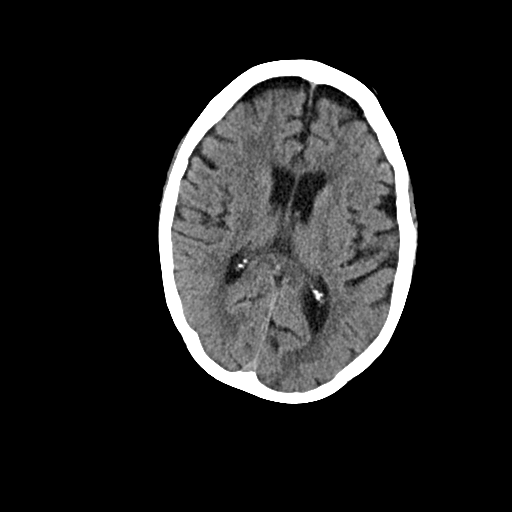
[im 16/31  bone]
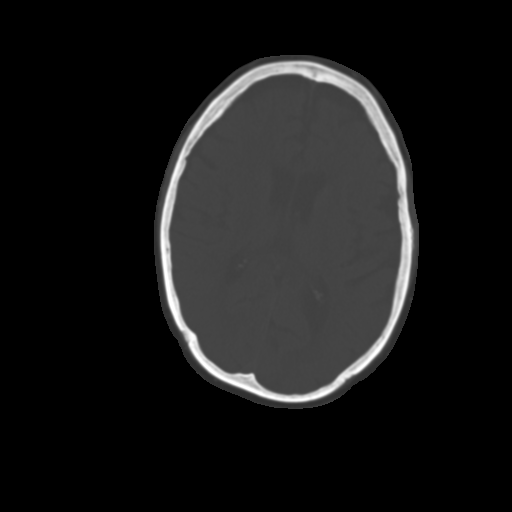
[im 18/31  brain]
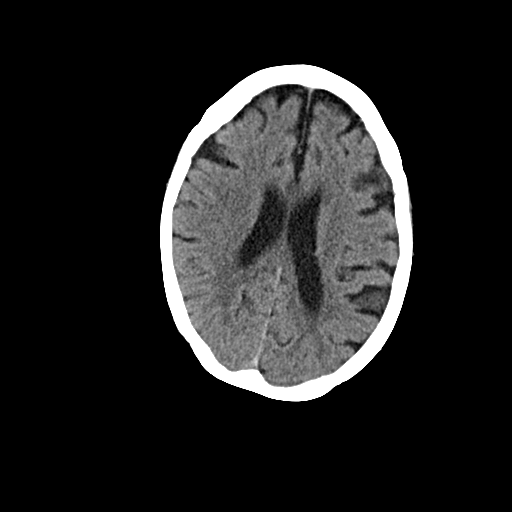
[im 20/31  brain]
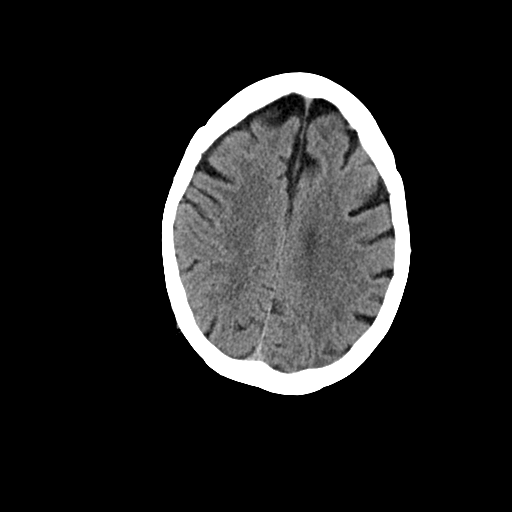
[im 22/31  brain]
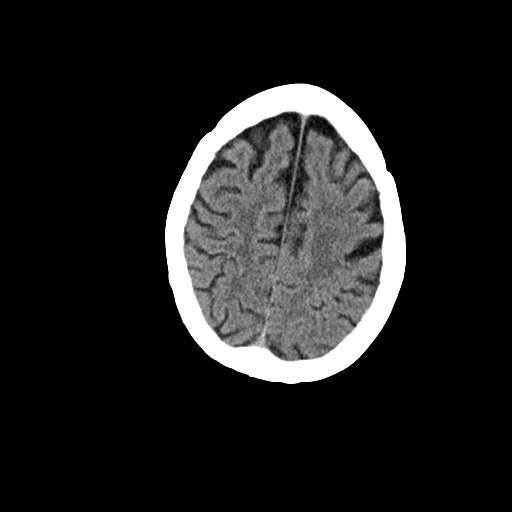
[im 23/31  brain]
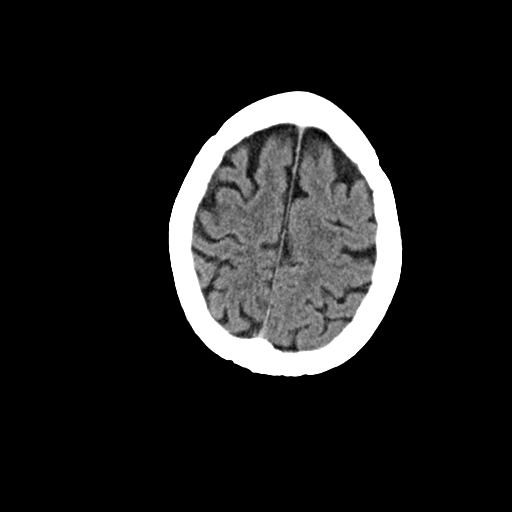
[im 23/31  bone]
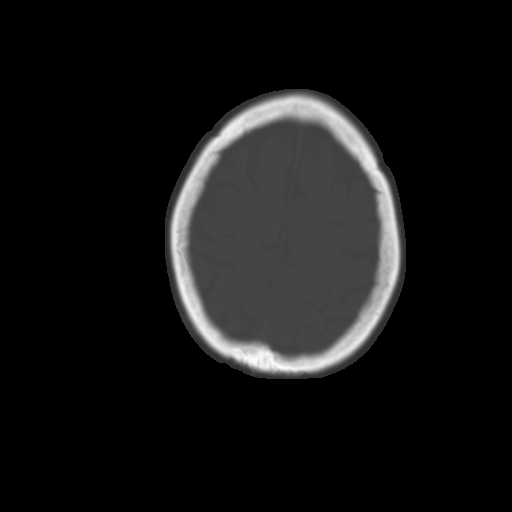
[im 25/31  brain]
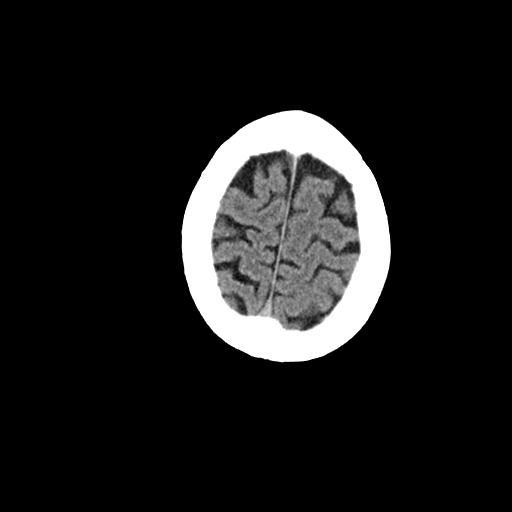
[im 27/31  brain]
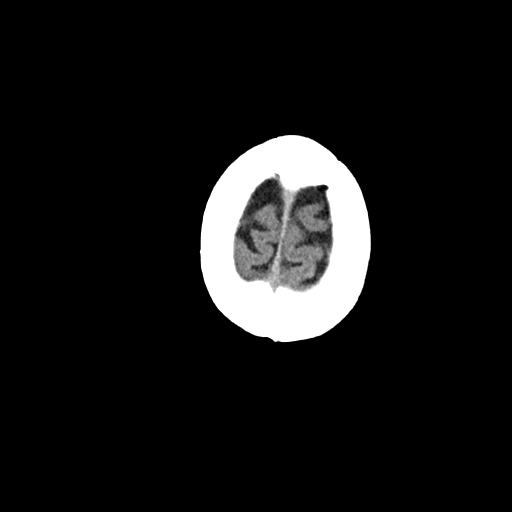
[im 29/31  brain]
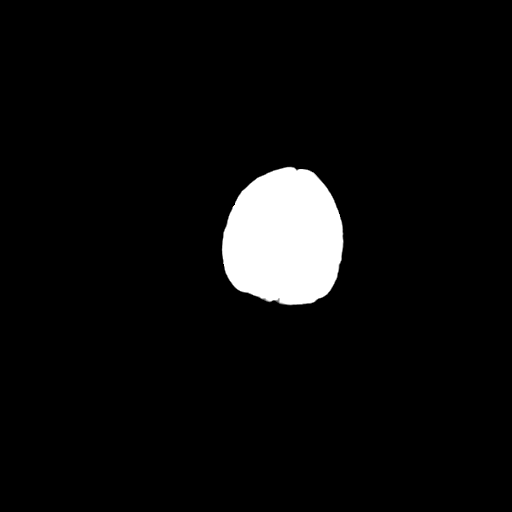

[16 of 30 positions shown; findings below may reference images not displayed]

FINDINGS: Brain: Mild age related volume loss. No acute intracranial
abnormality. Specifically, no hemorrhage, hydrocephalus, mass
lesion, acute infarction, or significant intracranial injury.

Vascular: No hyperdense vessel or unexpected calcification.

Skull: No acute calvarial abnormality.

Sinuses/Orbits: Visualized paranasal sinuses and mastoids clear.
Orbital soft tissues unremarkable.

Other: None
IMPRESSION: No acute intracranial abnormality.

## 2021-08-12 ENCOUNTER — Encounter (INDEPENDENT_AMBULATORY_CARE_PROVIDER_SITE_OTHER): Payer: Medicare Other | Admitting: Ophthalmology

## 2021-08-13 ENCOUNTER — Encounter (INDEPENDENT_AMBULATORY_CARE_PROVIDER_SITE_OTHER): Payer: Self-pay | Admitting: Ophthalmology

## 2021-08-13 ENCOUNTER — Ambulatory Visit (INDEPENDENT_AMBULATORY_CARE_PROVIDER_SITE_OTHER): Payer: Medicare Other | Admitting: Ophthalmology

## 2021-08-13 ENCOUNTER — Other Ambulatory Visit: Payer: Self-pay

## 2021-08-13 DIAGNOSIS — H353211 Exudative age-related macular degeneration, right eye, with active choroidal neovascularization: Secondary | ICD-10-CM | POA: Diagnosis not present

## 2021-08-13 MED ORDER — BEVACIZUMAB 2.5 MG/0.1ML IZ SOSY
2.5000 mg | PREFILLED_SYRINGE | INTRAVITREAL | Status: AC | PRN
  Administered 2021-08-13: 2.5 mg via INTRAVITREAL

## 2021-08-13 NOTE — Progress Notes (Signed)
08/13/2021     CHIEF COMPLAINT Patient presents for  Chief Complaint  Patient presents with   Retina Follow Up      HISTORY OF PRESENT ILLNESS: Emily Escobar is a 85 y.o. female who presents to the clinic today for:   HPI     Retina Follow Up   Patient presents with  Wet AMD.  In right eye.  This started 7 weeks ago.  Severity is mild.  Duration of 7 weeks.  Since onset it is gradually worsening.        Comments   7 week fu OD and Avastin OD Pt states, "My vision is not very good since I was here last. I see spots at night especially and I see side by side items on my dinner plates. I also have had eyelid surgery on both eyes 07/16/2021."       Last edited by Demetrios Loll, COA on 08/13/2021  1:55 PM.      Referring physician: Margot Ables, MD [address] [place],  [place] [address]  HISTORICAL INFORMATION:   Selected notes from the MEDICAL RECORD NUMBER    Lab Results  Component Value Date   HGBA1C 5.6 04/02/2021     CURRENT MEDICATIONS: No current outpatient medications on file. (Ophthalmic Drugs)   No current facility-administered medications for this visit. (Ophthalmic Drugs)   Current Outpatient Medications (Other)  Medication Sig   acetaminophen (TYLENOL) 500 MG tablet Take 1,000 mg by mouth every 8 (eight) hours as needed.   ALPRAZolam (XANAX) 0.25 MG tablet Take 1 tablet (0.25 mg total) by mouth 2 (two) times daily as needed for anxiety.   ALPRAZolam (XANAX) 0.5 MG tablet Take 1-2 tablets 30 minutes prior to MRI, may repeat once as needed. Must have driver.   amLODipine (NORVASC) 10 MG tablet Take 1 tablet by mouth daily.   atenolol (TENORMIN) 50 MG tablet Take 1 tablet by mouth daily.   atorvastatin (LIPITOR) 20 MG tablet Take 20 mg by mouth daily.   busPIRone (BUSPAR) 5 MG tablet Take 1 tablet by mouth at bedtime.    CLARITIN 10 MG tablet Take 10 mg by mouth every morning.   FLUoxetine (PROZAC) 10 MG capsule Take 1 capsule by  mouth daily.   mirtazapine (REMERON) 15 MG tablet Take 7.5 mg by mouth at bedtime.    Multiple Vitamin (MULTIVITAMIN WITH MINERALS) TABS tablet Take 1 tablet by mouth daily.   sertraline (ZOLOFT) 25 MG tablet Take 1 tablet by mouth daily.   traMADol (ULTRAM) 50 MG tablet Take one tablet by mouth every 6 hours as needed for moderate pain; Take two tablets by mouth every 6 hours as needed for severe pain   No current facility-administered medications for this visit. (Other)      REVIEW OF SYSTEMS:    ALLERGIES Allergies  Allergen Reactions   Codeine     Pt states she has tolerated norco in the past   Fentanyl Other (See Comments)    Pt reports having hallucinations.   Morphine And Related     Patient's son states pt feels "looping" with morphine     PAST MEDICAL HISTORY Past Medical History:  Diagnosis Date   Anxiety and depression    Closed left femoral fracture (HCC)    History of vertebral fracture    from fall in September 2016   Hypertension    Insomnia    Macular degeneration    Memory loss    Solitary kidney  Past Surgical History:  Procedure Laterality Date   ABDOMINAL HYSTERECTOMY     BLADDER SUSPENSION     BREAST SURGERY     CATARACT EXTRACTION, BILATERAL     EYE SURGERY     GALLBLADDER SURGERY     JOINT REPLACEMENT     NASAL SEPTUM SURGERY     RECONSTRUCTION OF EYELID     RECTOCELE REPAIR     TOTAL HIP ARTHROPLASTY Left 07/17/2016   Procedure: TOTAL HIP ARTHROPLASTY ANTERIOR APPROACH;  Surgeon: Tarry Kos, MD;  Location: [facility] OR;  Service: Orthopedics;  Laterality: Left;    FAMILY HISTORY Family History  Problem Relation Age of Onset   Heart disease Mother    Heart attack Father    Heart disease Brother    Heart disease Brother     SOCIAL HISTORY Social History   Tobacco Use   Smoking status: Never   Smokeless tobacco: Never  Vaping Use   Vaping Use: Never used  Substance Use Topics   Alcohol use: Not Currently   Drug use: No          OPHTHALMIC EXAM:  Base Eye Exam     Visual Acuity (ETDRS)       Right Left   Dist [place] 20/400 20/60   Dist ph [place] 20/200 20/50 -1         Tonometry (Tonopen, 2:00 PM)       Right Left   Pressure 13 14         Pupils       Pupils Dark Light Shape React APD   Right PERRL 6 5 Round Brisk None   Left PERRL 6 5 Round Brisk None         Visual Fields (Counting fingers)       Left Right    Full Full         Extraocular Movement       Right Left    Full Full         Neuro/Psych     Oriented x3: Yes   Mood/Affect: Normal         Dilation     Both eyes: 1.0% Mydriacyl, 2.5% Phenylephrine @ 2:00 PM           Slit Lamp and Fundus Exam     External Exam       Right Left   External Normal Normal         Slit Lamp Exam       Right Left   Lids/Lashes Normal Normal   Conjunctiva/Sclera White and quiet White and quiet   Cornea Clear Clear   Anterior Chamber Deep and quiet Deep and quiet   Iris Round and reactive Round and reactive   Lens Centered posterior chamber intraocular lens Centered posterior chamber intraocular lens   Anterior Vitreous Normal Normal         Fundus Exam       Right Left   Posterior Vitreous Normal    Disc Normal    C/D Ratio 0.1    Macula Macular thickening with srf, Drusen, Hard drusen, Intermediate age related macular degeneration, Retinal pigment epithelial mottling    Vessels Normal    Periphery Normal             IMAGING AND PROCEDURES  Imaging and Procedures for 08/13/21  OCT, Retina - OU - Both Eyes       Right Eye Quality was good. Scan locations included subfoveal. Central  Foveal Thickness: 290. Progression has been stable. Findings include abnormal foveal contour, subretinal fluid, choroidal neovascular membrane.   Left Eye Quality was good. Scan locations included subfoveal. Central Foveal Thickness: 285. Progression has been stable. Findings include abnormal foveal contour, no SRF.    Notes Persistent yet improving subretinal fluid OD, from CNVM, will repeat injection today at 7-week interval and examination again in 6 weeks     Intravitreal Injection, Pharmacologic Agent - OD - Right Eye       Time Out 08/13/2021. 2:15 PM. Confirmed correct patient, procedure, site, and patient consented.   Anesthesia Topical anesthesia was used. Anesthetic medications included Akten 3.5%.   Procedure Preparation included 10% betadine to eyelids, 5% betadine to ocular surface, Ofloxacin . A 30 gauge needle was used.   Injection: 2.5 mg bevacizumab 2.5 MG/0.1ML   Route: Intravitreal, Site: Right Eye   NDC: [phone], Lot: [id number]   Post-op Post injection exam found visual acuity of at least counting fingers. The patient tolerated the procedure well. There were no complications. The patient received written and verbal post procedure care education. Post injection medications were not given.              ASSESSMENT/PLAN:  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 choroidal neovascularization (HCC) Chronic serous retinal detachment subfoveal OD.  No interval change at 7-week post Avastin.  Interval delayed to 7 weeks due to patient's recent eyelid surgery.  We will repeat injection today to maintain and patient would like to apply for [facility] for you in order to potentially change to [facility].  She indicates she is not interested in painful co-pay by her self     ICD-10-CM   1. 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 choroidal neovascularization (HCC)  H35.3211 OCT, Retina - OU - Both Eyes    Intravitreal Injection, Pharmacologic Agent - OD - Right Eye    bevacizumab (AVASTIN) SOSY 2.5 mg      1.  Chronic serous retinal detachment OD.  Subfoveal.  Stable at 7-week interval.  Post Avastin.  Repeat injection today.  Patient will apply for Eylea few and will plan on injection intravitreal Eylea.  However patient does  not want to pay the co-pay around so we will have both medications available next visit  2.  3.  Ophthalmic Meds Ordered this visit:  Meds ordered this encounter  Medications   bevacizumab (AVASTIN) SOSY 2.5 mg       Return in about 6 weeks (around 09/24/2021) for dilate, OD, EYLEA OCT,, if approved Eylea 4 U.  There are no Patient Instructions on file for this visit.   Explained the diagnoses, plan, and follow up with the patient and they expressed understanding.  Patient expressed understanding of the importance of proper follow up care.   Alford Highland Williams Dietrick M.D. Diseases & Surgery of the Retina and Vitreous [facility] 08/13/21     Abbreviations: M myopia (nearsighted); A astigmatism; H hyperopia (farsighted); P presbyopia; Mrx spectacle prescription;  CTL contact lenses; OD right eye; OS left eye; OU both eyes  XT exotropia; ET esotropia; PEK punctate epithelial keratitis; PEE punctate epithelial erosions; DES dry eye syndrome; MGD meibomian gland dysfunction; ATs artificial tears; PFAT's preservative free artificial tears; [place] nuclear sclerotic cataract; PSC posterior subcapsular cataract; ERM epi-retinal membrane; PVD posterior vitreous detachment; RD retinal detachment; DM diabetes mellitus; DR diabetic retinopathy; NPDR non-proliferative diabetic retinopathy; PDR proliferative diabetic retinopathy; CSME clinically significant macular edema; DME  diabetic macular edema; dbh dot blot hemorrhages; CWS cotton wool spot; POAG primary open angle glaucoma; C/D cup-to-disc ratio; HVF humphrey visual field; GVF goldmann visual field; OCT optical coherence tomography; IOP intraocular pressure; BRVO Branch retinal vein occlusion; CRVO central retinal vein occlusion; CRAO central retinal artery occlusion; BRAO branch retinal artery occlusion; RT retinal tear; SB scleral buckle; PPV pars plana vitrectomy; VH Vitreous hemorrhage; PRP panretinal laser photocoagulation; IVK  intravitreal kenalog; VMT vitreomacular traction; MH Macular hole;  NVD neovascularization of the disc; NVE neovascularization elsewhere; AREDS age related eye disease study; ARMD age related macular degeneration; POAG primary open angle glaucoma; EBMD epithelial/anterior basement membrane dystrophy; ACIOL anterior chamber intraocular lens; IOL intraocular lens; PCIOL posterior chamber intraocular lens; Phaco/IOL phacoemulsification with intraocular lens placement; [place] photorefractive keratectomy; LASIK laser assisted in situ keratomileusis; HTN hypertension; DM diabetes mellitus; COPD chronic obstructive pulmonary disease

## 2021-08-13 NOTE — Assessment & Plan Note (Signed)
Chronic serous retinal detachment subfoveal OD.  No interval change at 7-week post Avastin.  Interval delayed to 7 weeks due to patient's recent eyelid surgery.  We will repeat injection today to maintain and patient would like to apply for [facility] for you in order to potentially change to [facility].  She indicates she is not interested in painful co-pay by her self

## 2021-08-22 ENCOUNTER — Encounter (INDEPENDENT_AMBULATORY_CARE_PROVIDER_SITE_OTHER): Payer: Self-pay

## 2021-09-24 ENCOUNTER — Encounter (INDEPENDENT_AMBULATORY_CARE_PROVIDER_SITE_OTHER): Payer: Medicare Other | Admitting: Ophthalmology

## 2021-09-24 ENCOUNTER — Other Ambulatory Visit: Payer: Self-pay

## 2021-09-24 ENCOUNTER — Ambulatory Visit (INDEPENDENT_AMBULATORY_CARE_PROVIDER_SITE_OTHER): Payer: Medicare Other | Admitting: Ophthalmology

## 2021-09-24 ENCOUNTER — Encounter (INDEPENDENT_AMBULATORY_CARE_PROVIDER_SITE_OTHER): Payer: Self-pay | Admitting: Ophthalmology

## 2021-09-24 DIAGNOSIS — H353132 Nonexudative age-related macular degeneration, bilateral, intermediate dry stage: Secondary | ICD-10-CM

## 2021-09-24 DIAGNOSIS — H353211 Exudative age-related macular degeneration, right eye, with active choroidal neovascularization: Secondary | ICD-10-CM

## 2021-09-24 MED ORDER — BEVACIZUMAB 2.5 MG/0.1ML IZ SOSY
2.5000 mg | PREFILLED_SYRINGE | INTRAVITREAL | Status: AC | PRN
  Administered 2021-09-24: 2.5 mg via INTRAVITREAL

## 2021-09-24 NOTE — Assessment & Plan Note (Addendum)
The nature of wet macular degeneration was discussed with the patient.  Forms of therapy reviewed include the use of Anti-VEGF medications injected painlessly into the eye, as well as other possible treatment modalities, including thermal laser therapy. Fellow eye involvement and risks were discussed with the patient. Upon the finding of wet age related macular degeneration, treatment will be offered. The treatment regimen is on a treat as needed basis with the intent to treat if necessary and extend interval of exams when possible. On average 1 out of 6 patients do not need lifetime therapy. However, the risk of recurrent disease is high for a lifetime.  Initially monthly, then periodic, examinations and evaluations will determine whether the next treatment is required on the day of the examination.  Persistent subretinal fluid OD, on Avastin.  Patient's right might in fact be a resistant to Avastin  Recommended change to Eylea today yet insurance did not approve.  I encourage the patient to contact her insurance company representative telling her "that I believe that they are practicing medicine without a license" and that they should not Flygt from the performance of this procedure and covering the higher cost medication, Eylea

## 2021-09-24 NOTE — Assessment & Plan Note (Signed)
No sign of CNVM OS by OCT 

## 2021-09-24 NOTE — Progress Notes (Signed)
09/24/2021     CHIEF COMPLAINT Patient presents for  Chief Complaint  Patient presents with   Retina Follow Up      HISTORY OF PRESENT ILLNESS: Emily Escobar is a 85 y.o. female who presents to the clinic today for:   HPI     Retina Follow Up   Patient presents with  Wet AMD.  In right eye.  This started 6 weeks ago.  Severity is mild.  Duration of 6 weeks.  Since onset it is gradually worsening.        Comments   6 week fu OD and Avastin OD (eylea not approved per [facility] REP, discussion with [address] Of this office.) Pt states, "I see double on my right eye.I am not sure if the images are stacked on top of each other or side by side but when I am eating I have to cover my right eye to be able to see to eat my food."       Last edited by Edmon Crape, MD on 09/24/2021  2:07 PM.      Referring physician: Margot Ables, MD [address] [place],  [place] [address]  HISTORICAL INFORMATION:   Selected notes from the MEDICAL RECORD NUMBER    Lab Results  Component Value Date   HGBA1C 5.6 04/02/2021     CURRENT MEDICATIONS: No current outpatient medications on file. (Ophthalmic Drugs)   No current facility-administered medications for this visit. (Ophthalmic Drugs)   Current Outpatient Medications (Other)  Medication Sig   acetaminophen (TYLENOL) 500 MG tablet Take 1,000 mg by mouth every 8 (eight) hours as needed.   ALPRAZolam (XANAX) 0.25 MG tablet Take 1 tablet (0.25 mg total) by mouth 2 (two) times daily as needed for anxiety.   ALPRAZolam (XANAX) 0.5 MG tablet Take 1-2 tablets 30 minutes prior to MRI, may repeat once as needed. Must have driver.   amLODipine (NORVASC) 10 MG tablet Take 1 tablet by mouth daily.   atenolol (TENORMIN) 50 MG tablet Take 1 tablet by mouth daily.   atorvastatin (LIPITOR) 20 MG tablet Take 20 mg by mouth daily.   busPIRone (BUSPAR) 5 MG tablet Take 1 tablet by mouth at bedtime.    CLARITIN 10 MG tablet Take 10 mg by mouth  every morning.   FLUoxetine (PROZAC) 10 MG capsule Take 1 capsule by mouth daily.   mirtazapine (REMERON) 15 MG tablet Take 7.5 mg by mouth at bedtime.    Multiple Vitamin (MULTIVITAMIN WITH MINERALS) TABS tablet Take 1 tablet by mouth daily.   sertraline (ZOLOFT) 25 MG tablet Take 1 tablet by mouth daily.   traMADol (ULTRAM) 50 MG tablet Take one tablet by mouth every 6 hours as needed for moderate pain; Take two tablets by mouth every 6 hours as needed for severe pain   No current facility-administered medications for this visit. (Other)      REVIEW OF SYSTEMS:    ALLERGIES Allergies  Allergen Reactions   Codeine     Pt states she has tolerated norco in the past   Fentanyl Other (See Comments)    Pt reports having hallucinations.   Morphine And Related     Patient's son states pt feels "looping" with morphine     PAST MEDICAL HISTORY Past Medical History:  Diagnosis Date   Anxiety and depression    Closed left femoral fracture (HCC)    History of vertebral fracture    from fall in September 2016  Hypertension    Insomnia    Macular degeneration    Memory loss    Solitary kidney    Past Surgical History:  Procedure Laterality Date   ABDOMINAL HYSTERECTOMY     BLADDER SUSPENSION     BREAST SURGERY     CATARACT EXTRACTION, BILATERAL     EYE SURGERY     GALLBLADDER SURGERY     JOINT REPLACEMENT     NASAL SEPTUM SURGERY     RECONSTRUCTION OF EYELID     RECTOCELE REPAIR     TOTAL HIP ARTHROPLASTY Left 07/17/2016   Procedure: TOTAL HIP ARTHROPLASTY ANTERIOR APPROACH;  Surgeon: Tarry Kos, MD;  Location: [facility] OR;  Service: Orthopedics;  Laterality: Left;    FAMILY HISTORY Family History  Problem Relation Age of Onset   Heart disease Mother    Heart attack Father    Heart disease Brother    Heart disease Brother     SOCIAL HISTORY Social History   Tobacco Use   Smoking status: Never   Smokeless tobacco: Never  Vaping Use   Vaping Use: Never used   Substance Use Topics   Alcohol use: Not Currently   Drug use: No         OPHTHALMIC EXAM:  Base Eye Exam     Visual Acuity (ETDRS)       Right Left   Dist [place] 20/400 20/100   Dist ph [place]  20/60         Tonometry (Tonopen, 1:54 PM)       Right Left   Pressure 11 12         Pupils       Pupils Dark Light Shape React APD   Right PERRL 5 4 Round Brisk None   Left PERRL 5 4 Round Brisk None         Visual Fields (Counting fingers)       Left Right    Full Full         Extraocular Movement       Right Left    Full Full         Neuro/Psych     Oriented x3: Yes   Mood/Affect: Normal         Dilation     Right eye: 1.0% Mydriacyl, 2.5% Phenylephrine @ 1:54 PM           Slit Lamp and Fundus Exam     External Exam       Right Left   External Normal Normal         Slit Lamp Exam       Right Left   Lids/Lashes Normal Normal   Conjunctiva/Sclera White and quiet White and quiet   Cornea Clear Clear   Anterior Chamber Deep and quiet Deep and quiet   Iris Round and reactive Round and reactive   Lens Centered posterior chamber intraocular lens Centered posterior chamber intraocular lens   Anterior Vitreous Normal Normal         Fundus Exam       Right Left   Posterior Vitreous Normal    Disc Normal    C/D Ratio 0.1    Macula Macular thickening with srf, Drusen, Hard drusen, Intermediate age related macular degeneration, Retinal pigment epithelial mottling    Vessels Normal    Periphery Normal             IMAGING AND PROCEDURES  Imaging and Procedures for 09/24/21  OCT,  Retina - OU - Both Eyes       Right Eye Quality was good. Scan locations included subfoveal. Central Foveal Thickness: 351. Progression has been stable. Findings include abnormal foveal contour, subretinal fluid, choroidal neovascular membrane.   Left Eye Quality was good. Scan locations included subfoveal. Central Foveal Thickness: 286. Progression  has been stable. Findings include abnormal foveal contour, no SRF.   Notes Persistent yet improving subretinal fluid OD, from CNVM, will repeat injection today at 6-week interval and examination again in 6 weeks, and potential change to [facility] if no improvement     Intravitreal Injection, Pharmacologic Agent - OD - Right Eye       Time Out 09/24/2021. 2:08 PM. Confirmed correct patient, procedure, site, and patient consented.   Anesthesia Topical anesthesia was used. Anesthetic medications included Akten 3.5%.   Procedure Preparation included 10% betadine to eyelids, 5% betadine to ocular surface, Ofloxacin . A 30 gauge needle was used.   Injection: 2.5 mg bevacizumab 2.5 MG/0.1ML   Route: Intravitreal, Site: Right Eye   NDC: [phone], Lot: [id number]   Post-op Post injection exam found visual acuity of at least counting fingers. The patient tolerated the procedure well. There were no complications. The patient received written and verbal post procedure care education. Post injection medications were not given.              ASSESSMENT/PLAN:  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 choroidal neovascularization (HCC) The nature of wet macular degeneration was discussed with the patient.  Forms of therapy reviewed include the use of Anti-VEGF medications injected painlessly into the eye, as well as other possible treatment modalities, including thermal laser therapy. Fellow eye involvement and risks were discussed with the patient. Upon the finding of wet age related macular degeneration, treatment will be offered. The treatment regimen is on a treat as needed basis with the intent to treat if necessary and extend interval of exams when possible. On average 1 out of 6 patients do not need lifetime therapy. However, the risk of recurrent disease is high for a lifetime.  Initially monthly, then periodic, examinations and evaluations will determine whether  the next treatment is required on the day of the examination.  Persistent subretinal fluid OD, on Avastin.  Patient's right might in fact be a resistant to Avastin  Recommended change to Eylea today yet insurance did not approve.  I encourage the patient to contact her insurance company representative telling her "that I believe that they are practicing medicine without a license" and that they should not Flygt from the performance of this procedure and covering the higher cost medication, Eylea  Intermediate stage nonexudative age-related macular degeneration of both eyes No sign of CNVM OS by OCT     ICD-10-CM   1. 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 choroidal neovascularization (HCC)  H35.3211 OCT, Retina - OU - Both Eyes    Intravitreal Injection, Pharmacologic Agent - OD - Right Eye    bevacizumab (AVASTIN) SOSY 2.5 mg    2. Intermediate stage nonexudative age-related macular degeneration of both eyes  H35.3132       1.  Persistent chronic activity CNVM with significant subretinal fluid subfoveal location impact on acuity.  We will repeat antivegF therapy today, constrained by insurance preauthorization should continue to use Avastin.  It is my judgment that Avastin is no longer effective at resolving resolving this condition.  I will continue to have my staff work with the  [employer] representatives to specifically reported that they will not cover this materially expensive Eylea at this time  2.  3.  Ophthalmic Meds Ordered this visit:  Meds ordered this encounter  Medications   bevacizumab (AVASTIN) SOSY 2.5 mg       Return in about 6 weeks (around 11/05/2021) for dilate, OD, EYLEA OCT.  There are no Patient Instructions on file for this visit.   Explained the diagnoses, plan, and follow up with the patient and they expressed understanding.  Patient expressed understanding of the importance of proper follow up care.   Alford Highland Romari Gasparro  M.D. Diseases & Surgery of the Retina and Vitreous [facility] 09/24/21     Abbreviations: M myopia (nearsighted); A astigmatism; H hyperopia (farsighted); P presbyopia; Mrx spectacle prescription;  CTL contact lenses; OD right eye; OS left eye; OU both eyes  XT exotropia; ET esotropia; PEK punctate epithelial keratitis; PEE punctate epithelial erosions; DES dry eye syndrome; MGD meibomian gland dysfunction; ATs artificial tears; PFAT's preservative free artificial tears; NSC nuclear sclerotic cataract; PSC posterior subcapsular cataract; ERM epi-retinal membrane; PVD posterior vitreous detachment; RD retinal detachment; DM diabetes mellitus; DR diabetic retinopathy; NPDR non-proliferative diabetic retinopathy; PDR proliferative diabetic retinopathy; CSME clinically significant macular edema; DME diabetic macular edema; dbh dot blot hemorrhages; CWS cotton wool spot; POAG primary open angle glaucoma; C/D cup-to-disc ratio; HVF humphrey visual field; GVF goldmann visual field; OCT optical coherence tomography; IOP intraocular pressure; BRVO Branch retinal vein occlusion; CRVO central retinal vein occlusion; CRAO central retinal artery occlusion; BRAO branch retinal artery occlusion; RT retinal tear; SB scleral buckle; PPV pars plana vitrectomy; VH Vitreous hemorrhage; PRP panretinal laser photocoagulation; IVK intravitreal kenalog; VMT vitreomacular traction; MH Macular hole;  NVD neovascularization of the disc; NVE neovascularization elsewhere; AREDS age related eye disease study; ARMD age related macular degeneration; POAG primary open angle glaucoma; EBMD epithelial/anterior basement membrane dystrophy; ACIOL anterior chamber intraocular lens; IOL intraocular lens; PCIOL posterior chamber intraocular lens; Phaco/IOL phacoemulsification with intraocular lens placement; PRK photorefractive keratectomy; LASIK laser assisted in situ keratomileusis; HTN hypertension; DM diabetes mellitus; COPD  chronic obstructive pulmonary disease

## 2021-11-05 ENCOUNTER — Ambulatory Visit (INDEPENDENT_AMBULATORY_CARE_PROVIDER_SITE_OTHER): Payer: Medicare Other | Admitting: Ophthalmology

## 2021-11-05 ENCOUNTER — Encounter (INDEPENDENT_AMBULATORY_CARE_PROVIDER_SITE_OTHER): Payer: Self-pay | Admitting: Ophthalmology

## 2021-11-05 ENCOUNTER — Other Ambulatory Visit: Payer: Self-pay

## 2021-11-05 DIAGNOSIS — H353132 Nonexudative age-related macular degeneration, bilateral, intermediate dry stage: Secondary | ICD-10-CM | POA: Diagnosis not present

## 2021-11-05 DIAGNOSIS — H353211 Exudative age-related macular degeneration, right eye, with active choroidal neovascularization: Secondary | ICD-10-CM

## 2021-11-05 MED ORDER — AFLIBERCEPT 2MG/0.05ML IZ SOLN FOR KALEIDOSCOPE
2.0000 mg | INTRAVITREAL | Status: AC | PRN
  Administered 2021-11-05: 2 mg via INTRAVITREAL

## 2021-11-05 NOTE — Progress Notes (Signed)
11/05/2021     CHIEF COMPLAINT Patient presents for  Chief Complaint  Patient presents with   Retina Follow Up      HISTORY OF PRESENT ILLNESS: Emily Escobar is a 85 y.o. female who presents to the clinic today for:   HPI     Retina Follow Up   Patient presents with  Wet AMD.  In right eye.  This started 6 weeks ago.  Severity is mild.  Duration of 6 weeks.  Since onset it is gradually worsening.        Comments   6 week fu OD oct Eylea OD. Pt states "about a month ago I started seeing double in the right eye. I cover the right eye and I see the food on my plate. When I cover my right eye I cannot see."  Denies new FOL or floaters.      Last edited by Nelva Nay on 11/05/2021  1:29 PM.      Referring physician: Margot Ables, MD [address] [place],  [place] [address]  HISTORICAL INFORMATION:   Selected notes from the MEDICAL RECORD NUMBER    Lab Results  Component Value Date   HGBA1C 5.6 04/02/2021     CURRENT MEDICATIONS: No current outpatient medications on file. (Ophthalmic Drugs)   No current facility-administered medications for this visit. (Ophthalmic Drugs)   Current Outpatient Medications (Other)  Medication Sig   acetaminophen (TYLENOL) 500 MG tablet Take 1,000 mg by mouth every 8 (eight) hours as needed.   ALPRAZolam (XANAX) 0.25 MG tablet Take 1 tablet (0.25 mg total) by mouth 2 (two) times daily as needed for anxiety.   ALPRAZolam (XANAX) 0.5 MG tablet Take 1-2 tablets 30 minutes prior to MRI, may repeat once as needed. Must have driver.   amLODipine (NORVASC) 10 MG tablet Take 1 tablet by mouth daily.   atenolol (TENORMIN) 50 MG tablet Take 1 tablet by mouth daily.   atorvastatin (LIPITOR) 20 MG tablet Take 20 mg by mouth daily.   busPIRone (BUSPAR) 5 MG tablet Take 1 tablet by mouth at bedtime.    CLARITIN 10 MG tablet Take 10 mg by mouth every morning.   FLUoxetine (PROZAC) 10 MG capsule Take 1 capsule by mouth daily.    mirtazapine (REMERON) 15 MG tablet Take 7.5 mg by mouth at bedtime.    Multiple Vitamin (MULTIVITAMIN WITH MINERALS) TABS tablet Take 1 tablet by mouth daily.   sertraline (ZOLOFT) 25 MG tablet Take 1 tablet by mouth daily.   traMADol (ULTRAM) 50 MG tablet Take one tablet by mouth every 6 hours as needed for moderate pain; Take two tablets by mouth every 6 hours as needed for severe pain   No current facility-administered medications for this visit. (Other)      REVIEW OF SYSTEMS:    ALLERGIES Allergies  Allergen Reactions   Codeine     Pt states she has tolerated norco in the past   Fentanyl Other (See Comments)    Pt reports having hallucinations.   Morphine And Related     Patient's son states pt feels "looping" with morphine     PAST MEDICAL HISTORY Past Medical History:  Diagnosis Date   Anxiety and depression    Closed left femoral fracture (HCC)    History of vertebral fracture    from fall in September 2016   Hypertension    Insomnia    Macular degeneration    Memory loss    Solitary  kidney    Past Surgical History:  Procedure Laterality Date   ABDOMINAL HYSTERECTOMY     BLADDER SUSPENSION     BREAST SURGERY     CATARACT EXTRACTION, BILATERAL     EYE SURGERY     GALLBLADDER SURGERY     JOINT REPLACEMENT     NASAL SEPTUM SURGERY     RECONSTRUCTION OF EYELID     RECTOCELE REPAIR     TOTAL HIP ARTHROPLASTY Left 07/17/2016   Procedure: TOTAL HIP ARTHROPLASTY ANTERIOR APPROACH;  Surgeon: Tarry Kos, MD;  Location: [facility] OR;  Service: Orthopedics;  Laterality: Left;    FAMILY HISTORY Family History  Problem Relation Age of Onset   Heart disease Mother    Heart attack Father    Heart disease Brother    Heart disease Brother     SOCIAL HISTORY Social History   Tobacco Use   Smoking status: Never   Smokeless tobacco: Never  Vaping Use   Vaping Use: Never used  Substance Use Topics   Alcohol use: Not Currently   Drug use: No          OPHTHALMIC EXAM:  Base Eye Exam     Visual Acuity (ETDRS)       Right Left   Dist [place] 20/200 20/70   Dist ph [place] 20/100 20/50         Tonometry (Tonopen, 1:34 PM)       Right Left   Pressure 14 10         Pupils       Pupils Dark Light APD   Right PERRL 5 4 None   Left PERRL 5 4 None         Extraocular Movement       Right Left    Full Full         Neuro/Psych     Oriented x3: Yes   Mood/Affect: Normal         Dilation     Right eye: 1.0% Mydriacyl, 2.5% Phenylephrine @ 1:34 PM           Slit Lamp and Fundus Exam     External Exam       Right Left   External Normal Normal         Slit Lamp Exam       Right Left   Lids/Lashes Normal Normal   Conjunctiva/Sclera White and quiet White and quiet   Cornea Clear Clear   Anterior Chamber Deep and quiet Deep and quiet   Iris Round and reactive Round and reactive   Lens Centered posterior chamber intraocular lens Centered posterior chamber intraocular lens   Anterior Vitreous Normal Normal         Fundus Exam       Right Left   Posterior Vitreous Normal    Disc Normal    C/D Ratio 0.1    Macula Macular thickening with srf, Drusen, Hard drusen, Intermediate age related macular degeneration, Retinal pigment epithelial mottling    Vessels Normal    Periphery Normal             IMAGING AND PROCEDURES  Imaging and Procedures for 11/05/21  Intravitreal Injection, Pharmacologic Agent - OD - Right Eye       Time Out 11/05/2021. 2:21 PM. Confirmed correct patient, procedure, site, and patient consented.   Anesthesia Topical anesthesia was used. Anesthetic medications included Lidocaine 4%.   Procedure Preparation included 10% betadine to eyelids, 5% betadine  to ocular surface, Ofloxacin . A 30 gauge needle was used.   Injection: 2 mg aflibercept 2 MG/0.05ML   Route: Intravitreal, Site: Right Eye   NDC: [id number], Lot: [id number], Waste: 0 mL   Post-op Post  injection exam found visual acuity of at least counting fingers. The patient tolerated the procedure well. There were no complications. The patient received written and verbal post procedure care education. Post injection medications were not given.      OCT, Retina - OU - Both Eyes       Right Eye Quality was good. Scan locations included subfoveal. Central Foveal Thickness: 384. Progression has been stable. Findings include abnormal foveal contour, subretinal fluid, choroidal neovascular membrane.   Left Eye Quality was good. Scan locations included subfoveal. Central Foveal Thickness: 289. Progression has been stable. Findings include abnormal foveal contour, no SRF.   Notes Persistent yet improving subretinal fluid OD, from CNVM, will repeat injection today at 6-week interval and examination again in 6 weeks, and will continue on intravitreal Eylea is slightly improved with less subretinal fluid.             ASSESSMENT/PLAN:  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 choroidal neovascularization (HCC) Persistent subretinal fluid slightly improved on intravitreal Eylea currently at 6-week interval.  Patient was informed by front office that there is going to be significant co-pay as the charity termed "[employer]" has ceased paying co-pays for the foreseeable future including into 2023.  She is aware of this choice and knows her co-pay for the cost of the drug at this date of delivery today can be as high as $400 but wishes to proceed and take on this financial burden.  Intermediate stage nonexudative age-related macular degeneration of both eyes No sign of CNVM OS     ICD-10-CM   1. 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 choroidal neovascularization (HCC)  H35.3211 Intravitreal Injection, Pharmacologic Agent - OD - Right Eye    OCT, Retina - OU - Both Eyes    aflibercept (EYLEA) SOLN 2 mg    2. Intermediate stage  nonexudative age-related macular degeneration of both eyes  H35.3132       1.  OD, with chronic active subretinal fluid from vascularized PED.  Slightly less fluid since changed intravitreal Eylea from Avastin.  We will repeat intravitreal Eylea today at 6-week interval and follow-up again in 6 weeks OD  2.  3.  Ophthalmic Meds Ordered this visit:  Meds ordered this encounter  Medications   aflibercept (EYLEA) SOLN 2 mg       Return in about 6 weeks (around 12/17/2021) for DILATE OU, COLOR FP, EYLEA OCT, OD,, if prior co-pay balance up-to-date.  There are no Patient Instructions on file for this visit.   Explained the diagnoses, plan, and follow up with the patient and they expressed understanding.  Patient expressed understanding of the importance of proper follow up care.   Alford Highland Kailly Richoux M.D. Diseases & Surgery of the Retina and Vitreous [facility] 11/05/21     Abbreviations: M myopia (nearsighted); A astigmatism; H hyperopia (farsighted); P presbyopia; Mrx spectacle prescription;  CTL contact lenses; OD right eye; OS left eye; OU both eyes  XT exotropia; ET esotropia; PEK punctate epithelial keratitis; PEE punctate epithelial erosions; DES dry eye syndrome; MGD meibomian gland dysfunction; ATs artificial tears; PFAT's preservative free artificial tears; NSC nuclear sclerotic cataract; PSC posterior subcapsular cataract; ERM epi-retinal membrane; PVD posterior vitreous  detachment; RD retinal detachment; DM diabetes mellitus; DR diabetic retinopathy; NPDR non-proliferative diabetic retinopathy; PDR proliferative diabetic retinopathy; CSME clinically significant macular edema; DME diabetic macular edema; dbh dot blot hemorrhages; CWS cotton wool spot; POAG primary open angle glaucoma; C/D cup-to-disc ratio; HVF humphrey visual field; GVF goldmann visual field; OCT optical coherence tomography; IOP intraocular pressure; BRVO Branch retinal vein occlusion; CRVO  central retinal vein occlusion; CRAO central retinal artery occlusion; BRAO branch retinal artery occlusion; RT retinal tear; SB scleral buckle; PPV pars plana vitrectomy; VH Vitreous hemorrhage; PRP panretinal laser photocoagulation; IVK intravitreal kenalog; VMT vitreomacular traction; MH Macular hole;  NVD neovascularization of the disc; NVE neovascularization elsewhere; AREDS age related eye disease study; ARMD age related macular degeneration; POAG primary open angle glaucoma; EBMD epithelial/anterior basement membrane dystrophy; ACIOL anterior chamber intraocular lens; IOL intraocular lens; PCIOL posterior chamber intraocular lens; Phaco/IOL phacoemulsification with intraocular lens placement; PRK photorefractive keratectomy; LASIK laser assisted in situ keratomileusis; HTN hypertension; DM diabetes mellitus; COPD chronic obstructive pulmonary disease

## 2021-11-05 NOTE — Assessment & Plan Note (Signed)
No sign of CNVM OS 

## 2021-11-05 NOTE — Assessment & Plan Note (Signed)
Persistent subretinal fluid slightly improved on intravitreal Eylea currently at 6-week interval.  Patient was informed by front office that there is going to be significant co-pay as the charity termed "[employer]" has ceased paying co-pays for the foreseeable future including into 2023.  She is aware of this choice and knows her co-pay for the cost of the drug at this date of delivery today can be as high as $400 but wishes to proceed and take on this financial burden.

## 2021-11-12 ENCOUNTER — Other Ambulatory Visit: Payer: Self-pay

## 2021-11-12 ENCOUNTER — Emergency Department (HOSPITAL_COMMUNITY)
Admission: EM | Admit: 2021-11-12 | Discharge: 2021-11-13 | Disposition: A | Discharge status: Home / Self Care | Payer: Medicare Other | Attending: Emergency Medicine | Admitting: Emergency Medicine

## 2021-11-12 ENCOUNTER — Encounter (HOSPITAL_COMMUNITY): Payer: Self-pay | Admitting: Emergency Medicine

## 2021-11-12 ENCOUNTER — Emergency Department (HOSPITAL_COMMUNITY): Payer: Medicare Other

## 2021-11-12 DIAGNOSIS — R519 Headache, unspecified: Secondary | ICD-10-CM | POA: Diagnosis not present

## 2021-11-12 DIAGNOSIS — Z5321 Procedure and treatment not carried out due to patient leaving prior to being seen by health care provider: Secondary | ICD-10-CM | POA: Diagnosis not present

## 2021-11-12 DIAGNOSIS — R42 Dizziness and giddiness: Secondary | ICD-10-CM | POA: Insufficient documentation

## 2021-11-12 LAB — CBC
HCT: 46.1 % — ABNORMAL HIGH (ref 36.0–46.0)
Hemoglobin: 14.6 g/dL (ref 12.0–15.0)
MCH: 29.7 pg (ref 26.0–34.0)
MCHC: 31.7 g/dL (ref 30.0–36.0)
MCV: 93.7 fL (ref 80.0–100.0)
Platelets: 169 10*3/uL (ref 150–400)
RBC: 4.92 MIL/uL (ref 3.87–5.11)
RDW: 13.4 % (ref 11.5–15.5)
WBC: 7.7 10*3/uL (ref 4.0–10.5)
nRBC: 0 % (ref 0.0–0.2)

## 2021-11-12 LAB — BASIC METABOLIC PANEL
Anion gap: 5 (ref 5–15)
BUN: 16 mg/dL (ref 8–23)
CO2: 28 mmol/L (ref 22–32)
Calcium: 9.2 mg/dL (ref 8.9–10.3)
Chloride: 108 mmol/L (ref 98–111)
Creatinine, Ser: 0.82 mg/dL (ref 0.44–1.00)
GFR, Estimated: >60 mL/min (ref >60)
Glucose, Bld: 94 mg/dL (ref 70–99)
Potassium: 4.9 mmol/L (ref 3.5–5.1)
Sodium: 141 mmol/L (ref 135–145)

## 2021-11-12 NOTE — ED Triage Notes (Signed)
Pt to triage via [facility] from [facility].  C/o dizziness x 6 months that has been worse x 1 week.  Full neuro and ENT work-up.  C/o headache 5/10.  Her R eye normally droops some.  Speech and gait normal.  CBG 105

## 2021-11-12 NOTE — ED Provider Notes (Signed)
Emergency Medicine Provider Triage Evaluation Note  Emily Escobar , a 85 y.o. female  was evaluated in triage.  Pt complains of dizziness for 6 months, worse in the past week.  Patient brought in from [facility].  She is a complaining of a headache.  And at baseline has right eye droop.  Patient also reports several frequent falls, most recently last week.  She is not chronically on anticoagulation.  Review of Systems  Positive: Headache, dizziness Negative: Syncope, chest pain, shortness of breath, vision changes, speech changes  Physical Exam  BP (!) 186/84 (BP Location: Right Arm)   Pulse 72   Temp 98.5 F (36.9 C) (Oral)   Resp 16   SpO2 97%  Gen:   Awake, no distress   Resp:  Normal effort  MSK:   Moves extremities without difficulty  Other:  5/5 strength in all extremities, cranial nerve exam appears grossly normal, PERRLA, decreased extraocular movements to upward gaze that patient states is normal for her  Medical Decision Making  Medically screening exam initiated at 2:51 PM.  Appropriate orders placed.  Kimmy Totten was informed that the remainder of the evaluation will be completed by another provider, this initial triage assessment does not replace that evaluation, and the importance of remaining in the ED until their evaluation is complete.     Jeanella Flattery 11/12/21 1459    Gerhard Munch, MD 11/16/21 1118

## 2021-11-13 NOTE — ED Notes (Signed)
Pt called for X4. Pt could not be found.

## 2021-11-21 ENCOUNTER — Encounter (INDEPENDENT_AMBULATORY_CARE_PROVIDER_SITE_OTHER): Payer: Self-pay

## 2021-12-19 ENCOUNTER — Encounter (INDEPENDENT_AMBULATORY_CARE_PROVIDER_SITE_OTHER): Payer: Medicare Other | Admitting: Ophthalmology

## 2021-12-25 ENCOUNTER — Other Ambulatory Visit: Payer: Self-pay

## 2021-12-25 ENCOUNTER — Ambulatory Visit (INDEPENDENT_AMBULATORY_CARE_PROVIDER_SITE_OTHER): Payer: Medicare Other | Admitting: Ophthalmology

## 2021-12-25 ENCOUNTER — Encounter (INDEPENDENT_AMBULATORY_CARE_PROVIDER_SITE_OTHER): Payer: Self-pay | Admitting: Ophthalmology

## 2021-12-25 DIAGNOSIS — H353211 Exudative age-related macular degeneration, right eye, with active choroidal neovascularization: Secondary | ICD-10-CM | POA: Diagnosis not present

## 2021-12-25 MED ORDER — AFLIBERCEPT 2MG/0.05ML IZ SOLN FOR KALEIDOSCOPE
2.0000 mg | INTRAVITREAL | Status: AC | PRN
  Administered 2021-12-25: 2 mg via INTRAVITREAL

## 2021-12-25 NOTE — Progress Notes (Signed)
12/25/2021     CHIEF COMPLAINT Patient presents for  Chief Complaint  Patient presents with   Retina Follow Up      HISTORY OF PRESENT ILLNESS: Emily Escobar is a 86 y.o. female who presents to the clinic today for:   HPI     Retina Follow Up           Diagnosis: Wet AMD   Laterality: right eye   Onset: 7 weeks ago   Severity: mild   Duration: 7 weeks   Course: gradually worsening         Comments   7 week fu OU, FP/OCT and Eylea OD, scheduled for 6-week follow-up but due to physician illness today at 7 weeks.  No interval change in acuity OD.  Pt states, "For the last month I have had double vision in my right eye. It goes away if I close my other eye it goes away and I can see my plate." Denies any FOL or new floaters        Last edited by Edmon Crape, MD on 12/25/2021  2:49 PM.      Referring physician: Margot Ables, MD [address] [place],  [place] [address]  HISTORICAL INFORMATION:   Selected notes from the MEDICAL RECORD NUMBER    Lab Results  Component Value Date   HGBA1C 5.6 04/02/2021     CURRENT MEDICATIONS: No current outpatient medications on file. (Ophthalmic Drugs)   No current facility-administered medications for this visit. (Ophthalmic Drugs)   Current Outpatient Medications (Other)  Medication Sig   acetaminophen (TYLENOL) 500 MG tablet Take 1,000 mg by mouth every 8 (eight) hours as needed.   ALPRAZolam (XANAX) 0.25 MG tablet Take 1 tablet (0.25 mg total) by mouth 2 (two) times daily as needed for anxiety.   ALPRAZolam (XANAX) 0.5 MG tablet Take 1-2 tablets 30 minutes prior to MRI, may repeat once as needed. Must have driver.   amLODipine (NORVASC) 10 MG tablet Take 1 tablet by mouth daily.   atenolol (TENORMIN) 50 MG tablet Take 1 tablet by mouth daily.   atorvastatin (LIPITOR) 20 MG tablet Take 20 mg by mouth daily.   busPIRone (BUSPAR) 5 MG tablet Take 1 tablet by mouth at bedtime.    CLARITIN 10 MG tablet  Take 10 mg by mouth every morning.   FLUoxetine (PROZAC) 10 MG capsule Take 1 capsule by mouth daily.   mirtazapine (REMERON) 15 MG tablet Take 7.5 mg by mouth at bedtime.    Multiple Vitamin (MULTIVITAMIN WITH MINERALS) TABS tablet Take 1 tablet by mouth daily.   sertraline (ZOLOFT) 25 MG tablet Take 1 tablet by mouth daily.   traMADol (ULTRAM) 50 MG tablet Take one tablet by mouth every 6 hours as needed for moderate pain; Take two tablets by mouth every 6 hours as needed for severe pain   No current facility-administered medications for this visit. (Other)      REVIEW OF SYSTEMS:    ALLERGIES Allergies  Allergen Reactions   Codeine     Pt states she has tolerated norco in the past   Fentanyl Other (See Comments)    Pt reports having hallucinations.   Morphine And Related     Patient's son states pt feels "looping" with morphine     PAST MEDICAL HISTORY Past Medical History:  Diagnosis Date   Anxiety and depression    Closed left femoral fracture (HCC)    History of vertebral fracture    from  fall in September 2016   Hypertension    Insomnia    Macular degeneration    Memory loss    Solitary kidney    Past Surgical History:  Procedure Laterality Date   ABDOMINAL HYSTERECTOMY     BLADDER SUSPENSION     BREAST SURGERY     CATARACT EXTRACTION, BILATERAL     EYE SURGERY     GALLBLADDER SURGERY     JOINT REPLACEMENT     NASAL SEPTUM SURGERY     RECONSTRUCTION OF EYELID     RECTOCELE REPAIR     TOTAL HIP ARTHROPLASTY Left 07/17/2016   Procedure: TOTAL HIP ARTHROPLASTY ANTERIOR APPROACH;  Surgeon: Leandrew Koyanagi, MD;  Location: [facility];  Service: Orthopedics;  Laterality: Left;    FAMILY HISTORY Family History  Problem Relation Age of Onset   Heart disease Mother    Heart attack Father    Heart disease Brother    Heart disease Brother     SOCIAL HISTORY Social History   Tobacco Use   Smoking status: Never   Smokeless tobacco: Never  Vaping Use   Vaping  Use: Never used  Substance Use Topics   Alcohol use: Not Currently   Drug use: No         OPHTHALMIC EXAM:  Base Eye Exam     Visual Acuity (ETDRS)       Right Left   Dist [place] 20/200 20/40 -2   Dist ph [place] 20/100 -1 NI         Tonometry (Tonopen, 2:27 PM)       Right Left   Pressure 16 15         Pupils       Pupils APD   Right PERRL None   Left PERRL None         Visual Fields (Counting fingers)       Left Right    Full Full         Extraocular Movement       Right Left    Full, Ortho Full, Ortho         Neuro/Psych     Oriented x3: Yes   Mood/Affect: Normal         Dilation     Both eyes: 1.0% Mydriacyl, 2.5% Phenylephrine @ 2:26 PM           Slit Lamp and Fundus Exam     External Exam       Right Left   External Normal Normal         Slit Lamp Exam       Right Left   Lids/Lashes Normal Normal   Conjunctiva/Sclera White and quiet White and quiet   Cornea Clear Clear   Anterior Chamber Deep and quiet Deep and quiet   Iris Round and reactive Round and reactive   Lens Centered posterior chamber intraocular lens Centered posterior chamber intraocular lens   Anterior Vitreous Normal Normal         Fundus Exam       Right Left   Posterior Vitreous Normal Normal   Disc Normal Normal   C/D Ratio 0.1 0.2   Macula Macular thickening with srf, Drusen, Hard drusen, Intermediate age related macular degeneration, Retinal pigment epithelial mottling Retinal pigment epithelial mottling, Intermediate age related macular degeneration   Vessels Normal Normal   Periphery Normal Normal            IMAGING AND PROCEDURES  Imaging  and Procedures for 12/25/21  OCT, Retina - OU - Both Eyes       Right Eye Quality was good. Scan locations included subfoveal. Central Foveal Thickness: 433. Progression has been stable. Findings include abnormal foveal contour, subretinal fluid, choroidal neovascular membrane.   Left  Eye Quality was good. Scan locations included subfoveal. Central Foveal Thickness: 288. Progression has been stable. Findings include abnormal foveal contour, no SRF.   Notes Persistent yet improving subretinal fluid OD, from CNVM, will repeat injection today at 7-week interval and examination again in 5 weeks, and will continue on intravitreal Eylea is slightly improved with less subretinal fluid.     Intravitreal Injection, Pharmacologic Agent - OD - Right Eye       Time Out 12/25/2021. 2:57 PM. Confirmed correct patient, procedure, site, and patient consented.   Anesthesia Topical anesthesia was used. Anesthetic medications included Lidocaine 4%.   Procedure Preparation included 10% betadine to eyelids, 5% betadine to ocular surface, Ofloxacin . A 30 gauge needle was used.   Injection: 2 mg aflibercept 2 MG/0.05ML   Route: Intravitreal, Site: Right Eye   NDC: [id number], Lot: [id number], Waste: 0 mL   Post-op Post injection exam found visual acuity of at least counting fingers. The patient tolerated the procedure well. There were no complications. The patient received written and verbal post procedure care education. Post injection medications were not given.      Color Fundus Photography Optos - OU - Both Eyes       Right Eye Progression has been stable. Macula : drusen, retinal pigment epithelium abnormalities. Vessels : normal observations. Periphery : normal observations.   Left Eye Progression has been stable. Disc findings include normal observations. Macula : drusen. Vessels : normal observations. Periphery : normal observations.   Notes OD, with no sign of clinical subretinal fluid on color fundus photography             ASSESSMENT/PLAN:  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 choroidal neovascularization (HCC) Chronic subfoveal fluid consequence of wet AMD basically unchanged at 7-week interval today.  We will repeat  injection today Eylea,  and shorten interval to 5 weeks to look for some sign of improvement of this condition before changing to another medication prevent haptic Vabysmo     ICD-10-CM   1. 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 choroidal neovascularization (HCC)  H35.3211 OCT, Retina - OU - Both Eyes    Intravitreal Injection, Pharmacologic Agent - OD - Right Eye    Color Fundus Photography Optos - OU - Both Eyes    aflibercept (EYLEA) SOLN 2 mg      1.  OD, chronic active subfoveal vascularized PED with overlying subretinal fluid.At 7-week interval today we will follow-up again in 5 weeks after injection Eylea OD today  2.  3.  Ophthalmic Meds Ordered this visit:  Meds ordered this encounter  Medications   aflibercept (EYLEA) SOLN 2 mg       Return in about 5 weeks (around 01/29/2022) for DILATE OU, EYLEA OCT, OD.  There are no Patient Instructions on file for this visit.   Explained the diagnoses, plan, and follow up with the patient and they expressed understanding.  Patient expressed understanding of the importance of proper follow up care.   Clent Demark Rollo Farquhar M.D. Diseases & Surgery of the Retina and Vitreous [facility] [place] 12/25/21     Abbreviations: M myopia (nearsighted); A astigmatism; H hyperopia (farsighted); P  presbyopia; Mrx spectacle prescription;  CTL contact lenses; OD right eye; OS left eye; OU both eyes  XT exotropia; ET esotropia; PEK punctate epithelial keratitis; PEE punctate epithelial erosions; DES dry eye syndrome; MGD meibomian gland dysfunction; ATs artificial tears; PFAT's preservative free artificial tears; [place] nuclear sclerotic cataract; PSC posterior subcapsular cataract; ERM epi-retinal membrane; PVD posterior vitreous detachment; RD retinal detachment; DM diabetes mellitus; DR diabetic retinopathy; NPDR non-proliferative diabetic retinopathy; PDR proliferative diabetic retinopathy; CSME clinically  significant macular edema; DME diabetic macular edema; dbh dot blot hemorrhages; CWS cotton wool spot; POAG primary open angle glaucoma; C/D cup-to-disc ratio; HVF humphrey visual field; GVF goldmann visual field; OCT optical coherence tomography; IOP intraocular pressure; BRVO Branch retinal vein occlusion; CRVO central retinal vein occlusion; CRAO central retinal artery occlusion; BRAO branch retinal artery occlusion; RT retinal tear; SB scleral buckle; PPV pars plana vitrectomy; VH Vitreous hemorrhage; PRP panretinal laser photocoagulation; IVK intravitreal kenalog; VMT vitreomacular traction; MH Macular hole;  NVD neovascularization of the disc; NVE neovascularization elsewhere; AREDS age related eye disease study; ARMD age related macular degeneration; POAG primary open angle glaucoma; EBMD epithelial/anterior basement membrane dystrophy; ACIOL anterior chamber intraocular lens; IOL intraocular lens; PCIOL posterior chamber intraocular lens; Phaco/IOL phacoemulsification with intraocular lens placement; [place] photorefractive keratectomy; LASIK laser assisted in situ keratomileusis; HTN hypertension; DM diabetes mellitus; COPD chronic obstructive pulmonary disease

## 2021-12-25 NOTE — Assessment & Plan Note (Addendum)
Chronic subfoveal fluid consequence of wet AMD basically unchanged at 7-week interval today.  We will repeat injection today Eylea,  and shorten interval to 5 weeks to look for some sign of improvement of this condition before changing to another medication prevent haptic Vabysmo

## 2022-01-29 ENCOUNTER — Other Ambulatory Visit: Payer: Self-pay

## 2022-01-29 ENCOUNTER — Encounter (INDEPENDENT_AMBULATORY_CARE_PROVIDER_SITE_OTHER): Payer: Self-pay | Admitting: Ophthalmology

## 2022-01-29 ENCOUNTER — Ambulatory Visit (INDEPENDENT_AMBULATORY_CARE_PROVIDER_SITE_OTHER): Payer: Medicare Other | Admitting: Ophthalmology

## 2022-01-29 DIAGNOSIS — H353211 Exudative age-related macular degeneration, right eye, with active choroidal neovascularization: Secondary | ICD-10-CM

## 2022-01-29 DIAGNOSIS — H35721 Serous detachment of retinal pigment epithelium, right eye: Secondary | ICD-10-CM | POA: Diagnosis not present

## 2022-01-29 MED ORDER — AFLIBERCEPT 2MG/0.05ML IZ SOLN FOR KALEIDOSCOPE
2.0000 mg | INTRAVITREAL | Status: AC | PRN
  Administered 2022-01-29: 2 mg via INTRAVITREAL

## 2022-01-29 NOTE — Assessment & Plan Note (Signed)
OD vastly improved CNVM as there is much less subretinal fluid overlying the vascularized PED on 5-week interval post Eylea.  Repeat injection today and follow-up again in 5 weeks

## 2022-01-29 NOTE — Assessment & Plan Note (Signed)
Component of wet AMD improved today at 5-week interval post [facility]

## 2022-01-29 NOTE — Progress Notes (Signed)
01/29/2022     CHIEF COMPLAINT Patient presents for  Chief Complaint  Patient presents with   Macular Degeneration      HISTORY OF PRESENT ILLNESS: Emily Escobar is a 86 y.o. female who presents to the clinic today for:   HPI   Pt states that the last Eylea injection on the injection site is still sore. Last injection was from 12/25/21. Pt states she can't see as well and is seeing double on OD.  Last edited by Angeline Slim on 01/29/2022  1:39 PM.      Referring physician: Margot Ables, MD [address] [place],  [place] [address]  HISTORICAL INFORMATION:   Selected notes from the MEDICAL RECORD NUMBER    Lab Results  Component Value Date   HGBA1C 5.6 04/02/2021     CURRENT MEDICATIONS: No current outpatient medications on file. (Ophthalmic Drugs)   No current facility-administered medications for this visit. (Ophthalmic Drugs)   Current Outpatient Medications (Other)  Medication Sig   acetaminophen (TYLENOL) 500 MG tablet Take 1,000 mg by mouth every 8 (eight) hours as needed.   ALPRAZolam (XANAX) 0.25 MG tablet Take 1 tablet (0.25 mg total) by mouth 2 (two) times daily as needed for anxiety.   ALPRAZolam (XANAX) 0.5 MG tablet Take 1-2 tablets 30 minutes prior to MRI, may repeat once as needed. Must have driver.   amLODipine (NORVASC) 10 MG tablet Take 1 tablet by mouth daily.   atenolol (TENORMIN) 50 MG tablet Take 1 tablet by mouth daily.   atorvastatin (LIPITOR) 20 MG tablet Take 20 mg by mouth daily.   busPIRone (BUSPAR) 5 MG tablet Take 1 tablet by mouth at bedtime.    CLARITIN 10 MG tablet Take 10 mg by mouth every morning.   FLUoxetine (PROZAC) 10 MG capsule Take 1 capsule by mouth daily.   mirtazapine (REMERON) 15 MG tablet Take 7.5 mg by mouth at bedtime.    Multiple Vitamin (MULTIVITAMIN WITH MINERALS) TABS tablet Take 1 tablet by mouth daily.   sertraline (ZOLOFT) 25 MG tablet Take 1 tablet by mouth daily.   traMADol (ULTRAM) 50 MG tablet Take one  tablet by mouth every 6 hours as needed for moderate pain; Take two tablets by mouth every 6 hours as needed for severe pain   No current facility-administered medications for this visit. (Other)      REVIEW OF SYSTEMS: ROS   Negative for: Constitutional, Gastrointestinal, Neurological, Skin, Genitourinary, Musculoskeletal, HENT, Endocrine, Cardiovascular, Eyes, Respiratory, Psychiatric, Allergic/Imm, Heme/Lymph Last edited by Edmon Crape, MD on 01/29/2022  2:26 PM.       ALLERGIES Allergies  Allergen Reactions   Codeine     Pt states she has tolerated norco in the past   Fentanyl Other (See Comments)    Pt reports having hallucinations.   Morphine And Related     Patient's son states pt feels "looping" with morphine     PAST MEDICAL HISTORY Past Medical History:  Diagnosis Date   Anxiety and depression    Closed left femoral fracture (HCC)    History of vertebral fracture    from fall in September 2016   Hypertension    Insomnia    Macular degeneration    Memory loss    Solitary kidney    Past Surgical History:  Procedure Laterality Date   ABDOMINAL HYSTERECTOMY     BLADDER SUSPENSION     BREAST SURGERY     CATARACT EXTRACTION, BILATERAL  EYE SURGERY     GALLBLADDER SURGERY     JOINT REPLACEMENT     NASAL SEPTUM SURGERY     RECONSTRUCTION OF EYELID     RECTOCELE REPAIR     TOTAL HIP ARTHROPLASTY Left 07/17/2016   Procedure: TOTAL HIP ARTHROPLASTY ANTERIOR APPROACH;  Surgeon: Tarry Kos, MD;  Location: [facility];  Service: Orthopedics;  Laterality: Left;    FAMILY HISTORY Family History  Problem Relation Age of Onset   Heart disease Mother    Heart attack Father    Heart disease Brother    Heart disease Brother     SOCIAL HISTORY Social History   Tobacco Use   Smoking status: Never   Smokeless tobacco: Never  Vaping Use   Vaping Use: Never used  Substance Use Topics   Alcohol use: Not Currently   Drug use: No         OPHTHALMIC  EXAM:  Base Eye Exam     Visual Acuity (Snellen - Linear)       Right Left   Dist [place] 20/100 -2 20/50 -2   Dist ph [place] 20/80 -2 20/40 -2         Tonometry (Tonopen, 1:44 PM)       Right Left   Pressure 10 11         Pupils       Pupils APD   Right PERRL None   Left PERRL None         Visual Fields       Left Right    Full Full         Extraocular Movement       Right Left    Full, Ortho Full, Ortho         Neuro/Psych     Oriented x3: Yes   Mood/Affect: Normal         Dilation     Right eye: 1.0% Mydriacyl, 2.5% Phenylephrine @ 1:44 PM           Slit Lamp and Fundus Exam     External Exam       Right Left   External Normal Normal         Slit Lamp Exam       Right Left   Lids/Lashes Meibomian gland dysfunction Meibomian gland dysfunction   Conjunctiva/Sclera White and quiet White and quiet   Cornea Clear Clear   Anterior Chamber Deep and quiet Deep and quiet   Iris Round and reactive Round and reactive   Lens Centered posterior chamber intraocular lens Centered posterior chamber intraocular lens   Anterior Vitreous Normal Normal         Fundus Exam       Right Left   Posterior Vitreous Normal    Disc Normal    C/D Ratio 0.1    Macula Macular thickening with srf, Drusen, Hard drusen, Intermediate age related macular degeneration, Retinal pigment epithelial mottling    Vessels Normal    Periphery Normal             IMAGING AND PROCEDURES  Imaging and Procedures for 01/29/22  OCT, Retina - OU - Both Eyes       Right Eye Quality was good. Scan locations included subfoveal. Central Foveal Thickness: 320. Progression has been stable. Findings include abnormal foveal contour, subretinal fluid, choroidal neovascular membrane.   Left Eye Quality was good. Scan locations included subfoveal. Central Foveal Thickness: 282. Progression has been stable. Findings  include abnormal foveal contour, no SRF.    Notes Persistent yet improving subretinal fluid OD, from CNVM, will repeat injection today at 5-week interval and examination again in 5 weeks, and will continue on intravitreal Eylea is slightly improved with less subretinal fluid.     Intravitreal Injection, Pharmacologic Agent - OD - Right Eye       Time Out 01/29/2022. 2:29 PM. Confirmed correct patient, procedure, site, and patient consented.   Anesthesia Topical anesthesia was used. Anesthetic medications included Lidocaine 4%.   Procedure Preparation included 10% betadine to eyelids, 5% betadine to ocular surface, Ofloxacin . A 30 gauge needle was used.   Injection: 2 mg aflibercept 2 MG/0.05ML   Route: Intravitreal, Site: Right Eye   NDC: [id number], Lot: [id number], Waste: 0 mL   Post-op Post injection exam found visual acuity of at least counting fingers. The patient tolerated the procedure well. There were no complications. The patient received written and verbal post procedure care education. Post injection medications included ocuflox.              ASSESSMENT/PLAN:  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 choroidal neovascularization (HCC) OD vastly improved CNVM as there is much less subretinal fluid overlying the vascularized PED on 5-week interval post Eylea.  Repeat injection today and follow-up again in 5 weeks  Retinal pigment epithelium serous detachment, right Component of wet AMD improved today at 5-week interval post Eylea     ICD-10-CM   1. 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 choroidal neovascularization (HCC)  H35.3211 OCT, Retina - OU - Both Eyes    Intravitreal Injection, Pharmacologic Agent - OD - Right Eye    aflibercept (EYLEA) SOLN 2 mg    2. Retinal pigment epithelium serous detachment, right  H35.721       1.  OD vastly improved today at 5-week follow-up interval post Eylea with much less serous elevation of the macula.   We will repeat injection today and repeat examination OD in 5 weeks  2.  3.  Ophthalmic Meds Ordered this visit:  Meds ordered this encounter  Medications   aflibercept (EYLEA) SOLN 2 mg       Return in about 5 weeks (around 03/05/2022) for dilate, OD, EYLEA OCT.  There are no Patient Instructions on file for this visit.   Explained the diagnoses, plan, and follow up with the patient and they expressed understanding.  Patient expressed understanding of the importance of proper follow up care.   Alford [place]land Julius Boniface M.D. Diseases & Surgery of the Retina and Vitreous [facility] 01/29/22     Abbreviations: M myopia (nearsighted); A astigmatism; H hyperopia (farsighted); P presbyopia; Mrx spectacle prescription;  CTL contact lenses; OD right eye; OS left eye; OU both eyes  XT exotropia; ET esotropia; PEK punctate epithelial keratitis; PEE punctate epithelial erosions; DES dry eye syndrome; MGD meibomian gland dysfunction; ATs artificial tears; PFAT's preservative free artificial tears; [place] nuclear sclerotic cataract; PSC posterior subcapsular cataract; ERM epi-retinal membrane; PVD posterior vitreous detachment; RD retinal detachment; DM diabetes mellitus; DR diabetic retinopathy; NPDR non-proliferative diabetic retinopathy; PDR proliferative diabetic retinopathy; CSME clinically significant macular edema; DME diabetic macular edema; dbh dot blot hemorrhages; CWS cotton wool spot; POAG primary open angle glaucoma; C/D cup-to-disc ratio; HVF humphrey visual field; GVF goldmann visual field; OCT optical coherence tomography; IOP intraocular pressure; BRVO Branch retinal vein occlusion; CRVO central retinal vein occlusion; CRAO central retinal artery occlusion; BRAO branch retinal  artery occlusion; RT retinal tear; SB scleral buckle; PPV pars plana vitrectomy; VH Vitreous hemorrhage; PRP panretinal laser photocoagulation; IVK intravitreal kenalog; VMT vitreomacular traction; MH  Macular hole;  NVD neovascularization of the disc; NVE neovascularization elsewhere; AREDS age related eye disease study; ARMD age related macular degeneration; POAG primary open angle glaucoma; EBMD epithelial/anterior basement membrane dystrophy; ACIOL anterior chamber intraocular lens; IOL intraocular lens; PCIOL posterior chamber intraocular lens; Phaco/IOL phacoemulsification with intraocular lens placement; PRK photorefractive keratectomy; LASIK laser assisted in situ keratomileusis; HTN hypertension; DM diabetes mellitus; COPD chronic obstructive pulmonary disease

## 2022-03-06 ENCOUNTER — Ambulatory Visit (INDEPENDENT_AMBULATORY_CARE_PROVIDER_SITE_OTHER): Payer: Medicare Other | Admitting: Ophthalmology

## 2022-03-06 ENCOUNTER — Encounter (INDEPENDENT_AMBULATORY_CARE_PROVIDER_SITE_OTHER): Payer: Self-pay | Admitting: Ophthalmology

## 2022-03-06 ENCOUNTER — Other Ambulatory Visit: Payer: Self-pay

## 2022-03-06 DIAGNOSIS — H353211 Exudative age-related macular degeneration, right eye, with active choroidal neovascularization: Secondary | ICD-10-CM

## 2022-03-06 DIAGNOSIS — H35721 Serous detachment of retinal pigment epithelium, right eye: Secondary | ICD-10-CM | POA: Diagnosis not present

## 2022-03-06 DIAGNOSIS — H353132 Nonexudative age-related macular degeneration, bilateral, intermediate dry stage: Secondary | ICD-10-CM | POA: Diagnosis not present

## 2022-03-06 MED ORDER — AFLIBERCEPT 2MG/0.05ML IZ SOLN FOR KALEIDOSCOPE
2.0000 mg | INTRAVITREAL | Status: AC | PRN
  Administered 2022-03-06: 2 mg via INTRAVITREAL

## 2022-03-06 NOTE — Assessment & Plan Note (Signed)
No sign of CNVM OS 

## 2022-03-06 NOTE — Progress Notes (Signed)
? ? ?03/06/2022 ? ?  ? ?CHIEF COMPLAINT ?Patient presents for  ?Chief Complaint  ?Patient presents with  ? Macular Degeneration  ? ? ? ? ?HISTORY OF PRESENT ILLNESS: ?Emily Escobar is a 86 y.o. female who presents to the clinic today for:  ? ?HPI   ?5 week for dilate OD EYLEA OCT. ?Pt states she is seeing double in both eyes but more in the right eye.  ?Pt denies floaters and FOL. ?Last edited by Angeline Slim on 03/06/2022  1:34 PM.  ?  ? ? ?Referring physician: ?Manning Charity, OD ?[address] ?[place],  [place] [address] ? ?HISTORICAL INFORMATION:  ? ?Selected notes from the MEDICAL RECORD NUMBER ?  ? ?Lab Results  ?Component Value Date  ? HGBA1C 5.6 04/02/2021  ?  ? ?CURRENT MEDICATIONS: ?No current outpatient medications on file. (Ophthalmic Drugs)  ? ?No current facility-administered medications for this visit. (Ophthalmic Drugs)  ? ?Current Outpatient Medications (Other)  ?Medication Sig  ? acetaminophen (TYLENOL) 500 MG tablet Take 1,000 mg by mouth every 8 (eight) hours as needed.  ? ALPRAZolam (XANAX) 0.25 MG tablet Take 1 tablet (0.25 mg total) by mouth 2 (two) times daily as needed for anxiety.  ? ALPRAZolam (XANAX) 0.5 MG tablet Take 1-2 tablets 30 minutes prior to MRI, may repeat once as needed. Must have driver.  ? amLODipine (NORVASC) 10 MG tablet Take 1 tablet by mouth daily.  ? atenolol (TENORMIN) 50 MG tablet Take 1 tablet by mouth daily.  ? atorvastatin (LIPITOR) 20 MG tablet Take 20 mg by mouth daily.  ? busPIRone (BUSPAR) 5 MG tablet Take 1 tablet by mouth at bedtime.   ? CLARITIN 10 MG tablet Take 10 mg by mouth every morning.  ? FLUoxetine (PROZAC) 10 MG capsule Take 1 capsule by mouth daily.  ? mirtazapine (REMERON) 15 MG tablet Take 7.5 mg by mouth at bedtime.   ? Multiple Vitamin (MULTIVITAMIN WITH MINERALS) TABS tablet Take 1 tablet by mouth daily.  ? sertraline (ZOLOFT) 25 MG tablet Take 1 tablet by mouth daily.  ? traMADol (ULTRAM) 50 MG tablet Take one tablet by mouth every 6 hours as needed for  moderate pain; Take two tablets by mouth every 6 hours as needed for severe pain  ? ?No current facility-administered medications for this visit. (Other)  ? ? ? ? ?REVIEW OF SYSTEMS: ?ROS   ?Negative for: Constitutional, Gastrointestinal, Neurological, Skin, Genitourinary, Musculoskeletal, HENT, Endocrine, Cardiovascular, Eyes, Respiratory, Psychiatric, Allergic/Imm, Heme/Lymph ?Last edited by Angeline Slim on 03/06/2022  1:34 PM.  ?  ? ? ? ?ALLERGIES ?Allergies  ?Allergen Reactions  ? Codeine   ?  Pt states she has tolerated norco in the past  ? Fentanyl Other (See Comments)  ?  Pt reports having hallucinations.  ? Morphine And Related   ?  Patient's son states pt feels "looping" with morphine ?  ? ? ?PAST MEDICAL HISTORY ?Past Medical History:  ?Diagnosis Date  ? Anxiety and depression   ? Closed left femoral fracture (HCC)   ? History of vertebral fracture   ? from fall in September 2016  ? Hypertension   ? Insomnia   ? Macular degeneration   ? Memory loss   ? Solitary kidney   ? ?Past Surgical History:  ?Procedure Laterality Date  ? ABDOMINAL HYSTERECTOMY    ? BLADDER SUSPENSION    ? BREAST SURGERY    ? CATARACT EXTRACTION, BILATERAL    ? EYE SURGERY    ? GALLBLADDER SURGERY    ?  JOINT REPLACEMENT    ? NASAL SEPTUM SURGERY    ? RECONSTRUCTION OF EYELID    ? RECTOCELE REPAIR    ? TOTAL HIP ARTHROPLASTY Left 07/17/2016  ? Procedure: TOTAL HIP ARTHROPLASTY ANTERIOR APPROACH;  Surgeon: Tarry KosNaiping M Xu, MD;  Location: [facility];  Service: Orthopedics;  Laterality: Left;  ? ? ?FAMILY HISTORY ?Family History  ?Problem Relation Age of Onset  ? Heart disease Mother   ? Heart attack Father   ? Heart disease Brother   ? Heart disease Brother   ? ? ?SOCIAL HISTORY ?Social History  ? ?Tobacco Use  ? Smoking status: Never  ? Smokeless tobacco: Never  ?Vaping Use  ? Vaping Use: Never used  ?Substance Use Topics  ? Alcohol use: Not Currently  ? Drug use: No  ? ?  ? ?  ? ?OPHTHALMIC EXAM: ? ?Base Eye Exam   ? ? Visual Acuity (ETDRS)   ? ?    Right Left  ? Dist [place] 20/100 20/60  ? Dist ph [place] 20/80 20/50  ? ?  ?  ? ? Tonometry (Tonopen, 1:44 PM)   ? ?   Right Left  ? Pressure 12 15  ? ?  ?  ? ? Pupils   ? ?   Pupils APD  ? Right PERRL None  ? Left PERRL None  ? ?  ?  ? ? Visual Fields   ? ?   Left Right  ?  Full Full  ? ?  ?  ? ? Extraocular Movement   ? ?   Right Left  ?  Full Full  ? ?  ?  ? ? Neuro/Psych   ? ? Oriented x3: Yes  ? Mood/Affect: Normal  ? ?  ?  ? ? Dilation   ? ? Right eye: 1.0% Mydriacyl, 2.5% Phenylephrine @ 1:43 PM  ? ?  ?  ? ?  ? ?Slit Lamp and Fundus Exam   ? ? External Exam   ? ?   Right Left  ? External Normal Normal  ? ?  ?  ? ? Slit Lamp Exam   ? ?   Right Left  ? Lids/Lashes Meibomian gland dysfunction Meibomian gland dysfunction  ? Conjunctiva/Sclera White and quiet White and quiet  ? Cornea Clear Clear  ? Anterior Chamber Deep and quiet Deep and quiet  ? Iris Round and reactive Round and reactive  ? Lens Centered posterior chamber intraocular lens Centered posterior chamber intraocular lens  ? Anterior Vitreous Normal Normal  ? ?  ?  ? ? Fundus Exam   ? ?   Right Left  ? Posterior Vitreous Normal   ? Disc Normal   ? C/D Ratio 0.1   ? Macula Macular thickening with srf, Drusen, Hard drusen, Intermediate age related macular degeneration, Retinal pigment epithelial mottling   ? Vessels Normal   ? Periphery Normal   ? ?  ?  ? ?  ? ? ?IMAGING AND PROCEDURES  ?Imaging and Procedures for 03/06/22 ? ?OCT, Retina - OU - Both Eyes   ? ?   ?Right Eye ?Quality was good. Scan locations included subfoveal. Central Foveal Thickness: 333. Progression has been stable. Findings include abnormal foveal contour, subretinal fluid, choroidal neovascular membrane.  ? ?Left Eye ?Quality was good. Scan locations included subfoveal. Central Foveal Thickness: 282. Progression has been stable. Findings include abnormal foveal contour, no SRF.  ? ?Notes ?Persistent yet improving subretinal fluid OD, from CNVM, will repeat injection  today at 5-week interval  and examination again in 5 weeks, and will continue on intravitreal Eylea is slightly improved with less subretinal fluid. ? ?  ? ?Intravitreal Injection, Pharmacologic Agent - OD - Right Eye   ? ?   ?Time Out ?03/06/2022. 2:10 PM. Confirmed correct patient, procedure, site, and patient consented.  ? ?Anesthesia ?Topical anesthesia was used. Anesthetic medications included Lidocaine 4%.  ? ?Procedure ?Preparation included 10% betadine to eyelids, 5% betadine to ocular surface, Ofloxacin . A 30 gauge needle was used.  ? ?Injection: ?2 mg aflibercept 2 MG/0.05ML ?  Route: Intravitreal, Site: Right Eye ?  NDC: [id number], Lot: [id number], Waste: 0 mL  ? ?Post-op ?Post injection exam found visual acuity of at least counting fingers. The patient tolerated the procedure well. There were no complications. The patient received written and verbal post procedure care education. Post injection medications included ocuflox.  ? ?  ? ? ?  ?  ? ?  ?ASSESSMENT/PLAN: ? ?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 choroidal neovascularization (HCC) ?OD, vastly improved overall that still active wet AMD subfoveal with slight improvement in acuity on Eylea currently at every 5-week exam interval ? ?Intermediate stage nonexudative age-related macular degeneration of both eyes ?No sign of CNVM OS ? ?Retinal pigment epithelium serous detachment, right ?Part of wet AMD OD  ? ?  ICD-10-CM   ?1. 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 choroidal neovascularization (HCC)  H35.3211 OCT, Retina - OU - Both Eyes  ?  Intravitreal Injection, Pharmacologic Agent - OD - Right Eye  ?  aflibercept (EYLEA) SOLN 2 mg  ?  ?2. Intermediate stage nonexudative age-related macular degeneration of both eyes  H35.3132   ?  ?3. Retinal pigment epithelium serous detachment, right  H35.721   ?  ? ? ?1.  Patient reports going to see Dr. Manning Charity in the very near future for refractive assistance ? ?2.  OD  vision currently pinhole 20/80 and limited acuity due to subfoveal scarring from CNVM yet still controlled with antivegF, intravitreal Eylea at every 5 to 6-week interval ? ?3.  Intermediate ARMD OS no sign of CNVM

## 2022-03-06 NOTE — Assessment & Plan Note (Signed)
Part of wet AMD OD ?

## 2022-03-06 NOTE — Assessment & Plan Note (Signed)
OD, vastly improved overall that still active wet AMD subfoveal with slight improvement in acuity on Eylea currently at every 5-week exam interval ?

## 2022-04-17 ENCOUNTER — Ambulatory Visit (INDEPENDENT_AMBULATORY_CARE_PROVIDER_SITE_OTHER): Payer: Medicare Other | Admitting: Ophthalmology

## 2022-04-17 ENCOUNTER — Encounter (INDEPENDENT_AMBULATORY_CARE_PROVIDER_SITE_OTHER): Payer: Self-pay | Admitting: Ophthalmology

## 2022-04-17 DIAGNOSIS — H353132 Nonexudative age-related macular degeneration, bilateral, intermediate dry stage: Secondary | ICD-10-CM | POA: Diagnosis not present

## 2022-04-17 DIAGNOSIS — H353211 Exudative age-related macular degeneration, right eye, with active choroidal neovascularization: Secondary | ICD-10-CM

## 2022-04-17 MED ORDER — AFLIBERCEPT 2MG/0.05ML IZ SOLN FOR KALEIDOSCOPE
2.0000 mg | INTRAVITREAL | Status: AC | PRN
  Administered 2022-04-17: 2 mg via INTRAVITREAL

## 2022-04-17 NOTE — Progress Notes (Signed)
? ? ?04/17/2022 ? ?  ? ?CHIEF COMPLAINT ?Patient presents for  ?Chief Complaint  ?Patient presents with  ? Macular Degeneration  ? ? ? ? ?HISTORY OF PRESENT ILLNESS: ?Emily Escobar is a 86 y.o. female who presents to the clinic today for:  ? ?HPI   ?6 weeks for DILATE, OD, EYLEA OCT. ?Pt denies floaters and FOL. ?Pt states vision has been stable. ? ?Last edited by Silvestre Moment on 04/17/2022  1:21 PM.  ?  ? ? ?Referring physician: ?Buzzy Han, MD ?No address on file ? ?HISTORICAL INFORMATION:  ? ?Selected notes from the [facility] ?  ? ?Lab Results  ?Component Value Date  ? HGBA1C 5.6 04/02/2021  ?  ? ?CURRENT MEDICATIONS: ?No current outpatient medications on file. (Ophthalmic Drugs)  ? ?No current facility-administered medications for this visit. (Ophthalmic Drugs)  ? ?Current Outpatient Medications (Other)  ?Medication Sig  ? acetaminophen (TYLENOL) 500 MG tablet Take 1,000 mg by mouth every 8 (eight) hours as needed.  ? ALPRAZolam (XANAX) 0.25 MG tablet Take 1 tablet (0.25 mg total) by mouth 2 (two) times daily as needed for anxiety.  ? ALPRAZolam (XANAX) 0.5 MG tablet Take 1-2 tablets 30 minutes prior to MRI, may repeat once as needed. Must have driver.  ? amLODipine (NORVASC) 10 MG tablet Take 1 tablet by mouth daily.  ? atenolol (TENORMIN) 50 MG tablet Take 1 tablet by mouth daily.  ? atorvastatin (LIPITOR) 20 MG tablet Take 20 mg by mouth daily.  ? busPIRone (BUSPAR) 5 MG tablet Take 1 tablet by mouth at bedtime.   ? CLARITIN 10 MG tablet Take 10 mg by mouth every morning.  ? FLUoxetine (PROZAC) 10 MG capsule Take 1 capsule by mouth daily.  ? mirtazapine (REMERON) 15 MG tablet Take 7.5 mg by mouth at bedtime.   ? Multiple Vitamin (MULTIVITAMIN WITH MINERALS) TABS tablet Take 1 tablet by mouth daily.  ? sertraline (ZOLOFT) 25 MG tablet Take 1 tablet by mouth daily.  ? traMADol (ULTRAM) 50 MG tablet Take one tablet by mouth every 6 hours as needed for moderate pain; Take two tablets by mouth every 6  hours as needed for severe pain  ? ?No current facility-administered medications for this visit. (Other)  ? ? ? ? ?REVIEW OF SYSTEMS: ?ROS   ?Negative for: Constitutional, Gastrointestinal, Neurological, Skin, Genitourinary, Musculoskeletal, HENT, Endocrine, Cardiovascular, Eyes, Respiratory, Psychiatric, Allergic/Imm, Heme/Lymph ?Last edited by Silvestre Moment on 04/17/2022  1:21 PM.  ?  ? ? ? ?ALLERGIES ?Allergies  ?Allergen Reactions  ? Codeine   ?  Pt states she has tolerated norco in the past  ? Fentanyl Other (See Comments)  ?  Pt reports having hallucinations.  ? Morphine And Related   ?  Patient's son states pt feels "looping" with morphine ?  ? ? ?PAST MEDICAL HISTORY ?Past Medical History:  ?Diagnosis Date  ? Anxiety and depression   ? Closed left femoral fracture ([place])   ? History of vertebral fracture   ? from fall in September 2016  ? Hypertension   ? Insomnia   ? Macular degeneration   ? Memory loss   ? Solitary kidney   ? ?Past Surgical History:  ?Procedure Laterality Date  ? ABDOMINAL HYSTERECTOMY    ? BLADDER SUSPENSION    ? BREAST SURGERY    ? CATARACT EXTRACTION, BILATERAL    ? EYE SURGERY    ? GALLBLADDER SURGERY    ? JOINT REPLACEMENT    ? NASAL SEPTUM SURGERY    ?  RECONSTRUCTION OF EYELID    ? RECTOCELE REPAIR    ? TOTAL HIP ARTHROPLASTY Left 07/17/2016  ? Procedure: TOTAL HIP ARTHROPLASTY ANTERIOR APPROACH;  Surgeon: Leandrew Koyanagi, MD;  Location: [facility];  Service: Orthopedics;  Laterality: Left;  ? ? ?FAMILY HISTORY ?Family History  ?Problem Relation Age of Onset  ? Heart disease Mother   ? Heart attack Father   ? Heart disease Brother   ? Heart disease Brother   ? ? ?SOCIAL HISTORY ?Social History  ? ?Tobacco Use  ? Smoking status: Never  ? Smokeless tobacco: Never  ?Vaping Use  ? Vaping Use: Never used  ?Substance Use Topics  ? Alcohol use: Not Currently  ? Drug use: No  ? ?  ? ?  ? ?OPHTHALMIC EXAM: ? ?Base Eye Exam   ? ? Visual Acuity (ETDRS)   ? ?   Right Left  ? Dist [place] 20/100 +2 20/70 -2 +2  ? Dist  ph [place] NI   ? ?  ?  ? ? Tonometry (Tonopen, 1:28 PM)   ? ?   Right Left  ? Pressure 13 15  ? ?  ?  ? ? Pupils   ? ?   Pupils APD  ? Right PERRL None  ? Left PERRL None  ? ?  ?  ? ? Visual Fields   ? ?   Left Right  ?  Full Full  ? ?  ?  ? ? Extraocular Movement   ? ?   Right Left  ?  Full Full  ? ?  ?  ? ? Neuro/Psych   ? ? Oriented x3: Yes  ? Mood/Affect: Normal  ? ?  ?  ? ? Dilation   ? ? Right eye: 2.5% Phenylephrine, 1.0% Mydriacyl @ 1:28 PM  ? ?  ?  ? ?  ? ?Slit Lamp and Fundus Exam   ? ? External Exam   ? ?   Right Left  ? External Normal Normal  ? ?  ?  ? ? Slit Lamp Exam   ? ?   Right Left  ? Lids/Lashes Meibomian gland dysfunction Meibomian gland dysfunction  ? Conjunctiva/Sclera White and quiet White and quiet  ? Cornea Clear Clear  ? Anterior Chamber Deep and quiet Deep and quiet  ? Iris Round and reactive Round and reactive  ? Lens Centered posterior chamber intraocular lens Centered posterior chamber intraocular lens  ? Anterior Vitreous Normal Normal  ? ?  ?  ? ? Fundus Exam   ? ?   Right Left  ? Posterior Vitreous Normal   ? Disc Normal   ? C/D Ratio 0.1   ? Macula Macular thickening with srf, Drusen, Hard drusen, Intermediate age related macular degeneration, Retinal pigment epithelial mottling   ? Vessels Normal   ? Periphery Normal   ? ?  ?  ? ?  ? ? ?IMAGING AND PROCEDURES  ?Imaging and Procedures for 04/17/22 ? ?OCT, Retina - OU - Both Eyes   ? ?   ?Right Eye ?Quality was good. Scan locations included subfoveal. Central Foveal Thickness: 382. Progression has been stable. Findings include abnormal foveal contour, subretinal fluid, choroidal neovascular membrane.  ? ?Left Eye ?Quality was good. Scan locations included subfoveal. Central Foveal Thickness: 292. Progression has been stable. Findings include abnormal foveal contour, no SRF.  ? ?Notes ?Persistent slightly increased subretinal fluid OD, from CNVM, will repeat injection today at 6-week interval and examination again in 6 weeks,  and will  continue on intravitreal Eylea is slightly improved with less subretinal fluid. ? ?  ? ?Intravitreal Injection, Pharmacologic Agent - OD - Right Eye   ? ?   ?Time Out ?04/17/2022. 2:25 PM. Confirmed correct patient, procedure, site, and patient consented.  ? ?Anesthesia ?Topical anesthesia was used. Anesthetic medications included Lidocaine 4%.  ? ?Procedure ?Preparation included 10% betadine to eyelids, 5% betadine to ocular surface, Ofloxacin . A 30 gauge needle was used.  ? ?Injection: ?2 mg aflibercept 2 MG/0.05ML ?  Route: Intravitreal, Site: Right Eye ?  [place]: [id number], Lot: [id number], Waste: 0 mL  ? ?Post-op ?Post injection exam found visual acuity of at least counting fingers. The patient tolerated the procedure well. There were no complications. The patient received written and verbal post procedure care education. Post injection medications included ocuflox.  ? ?  ? ? ?  ?  ? ?  ?ASSESSMENT/PLAN: ? ?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 choroidal neovascularization ([place]) ?The nature of wet macular degeneration was discussed with the patient.  Forms of therapy reviewed include the use of Anti-VEGF medications injected painlessly into the eye, as well as other possible treatment modalities, including thermal laser therapy. Fellow eye involvement and risks were discussed with the patient. Upon the finding of wet age related macular degeneration, treatment will be offered. The treatment regimen is on a treat as needed basis with the intent to treat if necessary and extend interval of exams when possible. On average 1 out of 6 patients do not need lifetime therapy. However, the risk of recurrent disease is high for a lifetime.  Initially monthly, then periodic, examinations and evaluations will determine whether the next treatment is required on the day of the examination. ? ?OD very active CNVM persistent even at 6-week interval on Eylea.,  For injection #5 Eylea ? ?Intermediate  stage nonexudative age-related macular degeneration of both eyes ?No sign of CNVM OS.  ? ?  ICD-10-CM   ?1. 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 choroidal neovascularizatio

## 2022-04-17 NOTE — Assessment & Plan Note (Signed)
No sign of CNVM OS 

## 2022-04-17 NOTE — Assessment & Plan Note (Addendum)
The nature of wet macular degeneration was discussed with the patient.  Forms of therapy reviewed include the use of Anti-VEGF medications injected painlessly into the eye, as well as other possible treatment modalities, including thermal laser therapy. Fellow eye involvement and risks were discussed with the patient. Upon the finding of wet age related macular degeneration, treatment will be offered. The treatment regimen is on a treat as needed basis with the intent to treat if necessary and extend interval of exams when possible. On average 1 out of 6 patients do not need lifetime therapy. However, the risk of recurrent disease is high for a lifetime.  Initially monthly, then periodic, examinations and evaluations will determine whether the next treatment is required on the day of the examination. ? ?OD very active CNVM persistent even at 6-week interval on Eylea.,  For injection #5 Eylea ?

## 2022-05-20 ENCOUNTER — Ambulatory Visit (INDEPENDENT_AMBULATORY_CARE_PROVIDER_SITE_OTHER): Payer: Medicare Other | Admitting: Ophthalmology

## 2022-05-20 ENCOUNTER — Encounter (INDEPENDENT_AMBULATORY_CARE_PROVIDER_SITE_OTHER): Payer: Self-pay | Admitting: Ophthalmology

## 2022-05-20 DIAGNOSIS — H35721 Serous detachment of retinal pigment epithelium, right eye: Secondary | ICD-10-CM | POA: Diagnosis not present

## 2022-05-20 DIAGNOSIS — H353132 Nonexudative age-related macular degeneration, bilateral, intermediate dry stage: Secondary | ICD-10-CM

## 2022-05-20 DIAGNOSIS — H353211 Exudative age-related macular degeneration, right eye, with active choroidal neovascularization: Secondary | ICD-10-CM | POA: Diagnosis not present

## 2022-05-20 MED ORDER — FARICIMAB-SVOA 6 MG/0.05ML IZ SOLN
6.0000 mg | INTRAVITREAL | Status: AC | PRN
  Administered 2022-05-20: 6 mg via INTRAVITREAL

## 2022-05-20 NOTE — Assessment & Plan Note (Signed)
No sign of CNVM OS 

## 2022-05-20 NOTE — Progress Notes (Signed)
05/20/2022     CHIEF COMPLAINT Patient presents for  Chief Complaint  Patient presents with   Macular Degeneration      HISTORY OF PRESENT ILLNESS: Emily Escobar is a 86 y.o. female who presents to the clinic today for:   HPI   5 weeks for OD, VABYSMO OCT. Pt stated vision has gotten worse since last visit. Pt is experiencing double vision. Pt denies new floaters and FOL.   Last edited by Angeline Slim on 05/20/2022  3:16 PM.      Referring physician: Lula Olszewski, MD [address] [place],  [place] [address]  HISTORICAL INFORMATION:   Selected notes from the MEDICAL RECORD NUMBER    Lab Results  Component Value Date   HGBA1C 5.6 04/02/2021     CURRENT MEDICATIONS: No current outpatient medications on file. (Ophthalmic Drugs)   No current facility-administered medications for this visit. (Ophthalmic Drugs)   Current Outpatient Medications (Other)  Medication Sig   acetaminophen (TYLENOL) 500 MG tablet Take 1,000 mg by mouth every 8 (eight) hours as needed.   ALPRAZolam (XANAX) 0.25 MG tablet Take 1 tablet (0.25 mg total) by mouth 2 (two) times daily as needed for anxiety.   ALPRAZolam (XANAX) 0.5 MG tablet Take 1-2 tablets 30 minutes prior to MRI, may repeat once as needed. Must have driver.   amLODipine (NORVASC) 10 MG tablet Take 1 tablet by mouth daily.   atenolol (TENORMIN) 50 MG tablet Take 1 tablet by mouth daily.   atorvastatin (LIPITOR) 20 MG tablet Take 20 mg by mouth daily.   busPIRone (BUSPAR) 5 MG tablet Take 1 tablet by mouth at bedtime.    CLARITIN 10 MG tablet Take 10 mg by mouth every morning.   FLUoxetine (PROZAC) 10 MG capsule Take 1 capsule by mouth daily.   mirtazapine (REMERON) 15 MG tablet Take 7.5 mg by mouth at bedtime.    Multiple Vitamin (MULTIVITAMIN WITH MINERALS) TABS tablet Take 1 tablet by mouth daily.   sertraline (ZOLOFT) 25 MG tablet Take 1 tablet by mouth daily.   traMADol (ULTRAM) 50 MG tablet Take one tablet by mouth every  6 hours as needed for moderate pain; Take two tablets by mouth every 6 hours as needed for severe pain   No current facility-administered medications for this visit. (Other)      REVIEW OF SYSTEMS: ROS   Negative for: Constitutional, Gastrointestinal, Neurological, Skin, Genitourinary, Musculoskeletal, HENT, Endocrine, Cardiovascular, Eyes, Respiratory, Psychiatric, Allergic/Imm, Heme/Lymph Last edited by Angeline Slim on 05/20/2022  3:16 PM.       ALLERGIES Allergies  Allergen Reactions   Codeine     Pt states she has tolerated norco in the past   Fentanyl Other (See Comments)    Pt reports having hallucinations.   Morphine And Related     Patient's son states pt feels "looping" with morphine     PAST MEDICAL HISTORY Past Medical History:  Diagnosis Date   Anxiety and depression    Closed left femoral fracture (HCC)    History of vertebral fracture    from fall in September 2016   Hypertension    Insomnia    Macular degeneration    Memory loss    Solitary kidney    Past Surgical History:  Procedure Laterality Date   ABDOMINAL HYSTERECTOMY     BLADDER SUSPENSION     BREAST SURGERY     CATARACT EXTRACTION, BILATERAL     EYE SURGERY     GALLBLADDER SURGERY  JOINT REPLACEMENT     NASAL SEPTUM SURGERY     RECONSTRUCTION OF EYELID     RECTOCELE REPAIR     TOTAL HIP ARTHROPLASTY Left 07/17/2016   Procedure: TOTAL HIP ARTHROPLASTY ANTERIOR APPROACH;  Surgeon: Tarry Kos, MD;  Location: [facility] OR;  Service: Orthopedics;  Laterality: Left;    FAMILY HISTORY Family History  Problem Relation Age of Onset   Heart disease Mother    Heart attack Father    Heart disease Brother    Heart disease Brother     SOCIAL HISTORY Social History   Tobacco Use   Smoking status: Never   Smokeless tobacco: Never  Vaping Use   Vaping Use: Never used  Substance Use Topics   Alcohol use: Not Currently   Drug use: No         OPHTHALMIC EXAM:  Base Eye Exam     Visual  Acuity (ETDRS)       Right Left   Dist [place] 20/100 -1 20/150   Dist ph [place] 20/70 -1 20/80         Tonometry (Tonopen, 3:23 PM)       Right Left   Pressure 12 13         Pupils       Pupils APD   Right PERRL None   Left PERRL None         Visual Fields       Left Right    Full Full         Extraocular Movement       Right Left    Full Full         Neuro/Psych     Oriented x3: Yes   Mood/Affect: Normal         Dilation     Right eye: 2.5% Phenylephrine, 1.0% Mydriacyl @ 3:23 PM           Slit Lamp and Fundus Exam     External Exam       Right Left   External Normal Normal         Slit Lamp Exam       Right Left   Lids/Lashes Meibomian gland dysfunction Meibomian gland dysfunction   Conjunctiva/Sclera White and quiet White and quiet   Cornea Clear Clear   Anterior Chamber Deep and quiet Deep and quiet   Iris Round and reactive Round and reactive   Lens Centered posterior chamber intraocular lens Centered posterior chamber intraocular lens   Anterior Vitreous Normal Normal         Fundus Exam       Right Left   Posterior Vitreous Normal    Disc Normal    C/D Ratio 0.1    Macula Macular thickening with srf, Drusen, Hard drusen, Intermediate age related macular degeneration, Retinal pigment epithelial mottling    Vessels Normal    Periphery Normal             IMAGING AND PROCEDURES  Imaging and Procedures for 05/20/22  OCT, Retina - OU - Both Eyes       Right Eye Quality was good. Scan locations included subfoveal. Central Foveal Thickness: 329. Progression has been stable. Findings include abnormal foveal contour, subretinal fluid, choroidal neovascular membrane.   Left Eye Quality was good. Scan locations included subfoveal. Central Foveal Thickness: 282. Progression has been stable. Findings include abnormal foveal contour, no SRF.   Notes Persistent slightly increased subretinal fluid OD, from CNVM, will  repeat  injection today at 5-week interval and examination again in 6 weeks, and will continue on intravitreal Eylea is slightly improved with less subretinal fluid.     Intravitreal Injection, Pharmacologic Agent - OD - Right Eye       Time Out 05/20/2022. 4:23 PM. Confirmed correct patient, procedure, site, and patient consented.   Anesthesia Topical anesthesia was used. Anesthetic medications included Lidocaine 4%.   Procedure Preparation included 10% betadine to eyelids, 5% betadine to ocular surface, Ofloxacin . A 30 gauge needle was used.   Injection: 6 mg faricimab-svoa 6 MG/0.05ML   Route: Intravitreal, Site: Right Eye   NDC: [phone], Lot: [id number], Expiration date: 11/15/2023, Waste: 0 mL   Post-op Post injection exam found visual acuity of at least counting fingers. The patient tolerated the procedure well. There were no complications. The patient received written and verbal post procedure care education. Post injection medications included ocuflox.              ASSESSMENT/PLAN:  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 choroidal neovascularization (HCC) The nature of wet macular degeneration was discussed with the patient.  Forms of therapy reviewed include the use of Anti-VEGF medications injected painlessly into the eye, as well as other possible treatment modalities, including thermal laser therapy. Fellow eye involvement and risks were discussed with the patient. Upon the finding of wet age related macular degeneration, treatment will be offered. The treatment regimen is on a treat as needed basis with the intent to treat if necessary and extend interval of exams when possible. On average 1 out of 6 patients do not need lifetime therapy. However, the risk of recurrent disease is high for a lifetime.  Initially monthly, then periodic, examinations and evaluations will determine whether the next treatment is required on the day of the  examination.  OD, subfoveal fluid remains however still active.  On Eylea in the past.  Change to Vabysmo today  Intermediate stage nonexudative age-related macular degeneration of both eyes No sign of CNVM OS  Retinal pigment epithelium serous detachment, right Component of wet AMD OD.  Still active.  Change to Vabysmo on 05/20/2022 as resistant to Avastin and now Eylea     ICD-10-CM   1. 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 choroidal neovascularization (HCC)  H35.3211 OCT, Retina - OU - Both Eyes    Intravitreal Injection, Pharmacologic Agent - OD - Right Eye    faricimab-svoa (VABYSMO) 6mg /0.4505mL intravitreal injection    CANCELED: Intravitreal Injection, Pharmacologic Agent - OD - Right Eye    2. Intermediate stage nonexudative age-related macular degeneration of both eyes  H35.3132     3. Retinal pigment epithelium serous detachment, right  H35.721       1.  Wet AMD with subfoveal involvement OD with chronic subretinal fluid.  Resistant to Avastin in the past and now apparent resistance also to [facility].  We will change to Vabysmo injection OD today  2.  3.  Ophthalmic Meds Ordered this visit:  Meds ordered this encounter  Medications   faricimab-svoa (VABYSMO) 6mg /0.4105mL intravitreal injection       Return in about 5 weeks (around 06/24/2022) for dilate, OD, VABYSMO OCT.  There are no Patient Instructions on file for this visit.   Explained the diagnoses, plan, and follow up with the patient and they expressed understanding.  Patient expressed understanding of the importance of proper follow up care.   Alford HighlandGary A. Tyshawna Alarid M.D. Diseases &  Surgery of the Retina and Vitreous [facility] 05/20/22     Abbreviations: M myopia (nearsighted); A astigmatism; H hyperopia (farsighted); P presbyopia; Mrx spectacle prescription;  CTL contact lenses; OD right eye; OS left eye; OU both eyes  XT exotropia; ET esotropia; PEK punctate  epithelial keratitis; PEE punctate epithelial erosions; DES dry eye syndrome; MGD meibomian gland dysfunction; ATs artificial tears; PFAT's preservative free artificial tears; NSC nuclear sclerotic cataract; PSC posterior subcapsular cataract; ERM epi-retinal membrane; PVD posterior vitreous detachment; RD retinal detachment; DM diabetes mellitus; DR diabetic retinopathy; NPDR non-proliferative diabetic retinopathy; PDR proliferative diabetic retinopathy; CSME clinically significant macular edema; DME diabetic macular edema; dbh dot blot hemorrhages; CWS cotton wool spot; POAG primary open angle glaucoma; C/D cup-to-disc ratio; HVF humphrey visual field; GVF goldmann visual field; OCT optical coherence tomography; IOP intraocular pressure; BRVO Branch retinal vein occlusion; CRVO central retinal vein occlusion; CRAO central retinal artery occlusion; BRAO branch retinal artery occlusion; RT retinal tear; SB scleral buckle; PPV pars plana vitrectomy; VH Vitreous hemorrhage; PRP panretinal laser photocoagulation; IVK intravitreal kenalog; VMT vitreomacular traction; MH Macular hole;  NVD neovascularization of the disc; NVE neovascularization elsewhere; AREDS age related eye disease study; ARMD age related macular degeneration; POAG primary open angle glaucoma; EBMD epithelial/anterior basement membrane dystrophy; ACIOL anterior chamber intraocular lens; IOL intraocular lens; PCIOL posterior chamber intraocular lens; Phaco/IOL phacoemulsification with intraocular lens placement; PRK photorefractive keratectomy; LASIK laser assisted in situ keratomileusis; HTN hypertension; DM diabetes mellitus; COPD chronic obstructive pulmonary disease

## 2022-05-20 NOTE — Assessment & Plan Note (Signed)
Component of wet AMD OD.  Still active.  Change to Vabysmo on 05/20/2022 as resistant to Avastin and now [facility]

## 2022-05-20 NOTE — Assessment & Plan Note (Signed)
The nature of wet macular degeneration was discussed with the patient.  Forms of therapy reviewed include the use of Anti-VEGF medications injected painlessly into the eye, as well as other possible treatment modalities, including thermal laser therapy. Fellow eye involvement and risks were discussed with the patient. Upon the finding of wet age related macular degeneration, treatment will be offered. The treatment regimen is on a treat as needed basis with the intent to treat if necessary and extend interval of exams when possible. On average 1 out of 6 patients do not need lifetime therapy. However, the risk of recurrent disease is high for a lifetime.  Initially monthly, then periodic, examinations and evaluations will determine whether the next treatment is required on the day of the examination.  OD, subfoveal fluid remains however still active.  On Eylea in the past.  Change to Vabysmo today

## 2022-05-26 ENCOUNTER — Encounter (INDEPENDENT_AMBULATORY_CARE_PROVIDER_SITE_OTHER): Payer: Medicare Other | Admitting: Ophthalmology

## 2022-06-24 ENCOUNTER — Ambulatory Visit (INDEPENDENT_AMBULATORY_CARE_PROVIDER_SITE_OTHER): Payer: Medicare Other | Admitting: Ophthalmology

## 2022-06-24 ENCOUNTER — Encounter (INDEPENDENT_AMBULATORY_CARE_PROVIDER_SITE_OTHER): Payer: Self-pay | Admitting: Ophthalmology

## 2022-06-24 DIAGNOSIS — H353132 Nonexudative age-related macular degeneration, bilateral, intermediate dry stage: Secondary | ICD-10-CM

## 2022-06-24 DIAGNOSIS — H353211 Exudative age-related macular degeneration, right eye, with active choroidal neovascularization: Secondary | ICD-10-CM | POA: Diagnosis not present

## 2022-06-24 DIAGNOSIS — H35721 Serous detachment of retinal pigment epithelium, right eye: Secondary | ICD-10-CM

## 2022-06-24 MED ORDER — FARICIMAB-SVOA 6 MG/0.05ML IZ SOLN
6.0000 mg | INTRAVITREAL | Status: AC | PRN
  Administered 2022-06-24: 6 mg via INTRAVITREAL

## 2022-06-24 NOTE — Assessment & Plan Note (Signed)
No sign of CNVM OS 

## 2022-06-24 NOTE — Assessment & Plan Note (Signed)
Improved much less subretinal fluid today 5 weeks post recent change to Vabysmo.  Repeat injection today to maintain

## 2022-06-24 NOTE — Progress Notes (Signed)
06/24/2022     CHIEF COMPLAINT Patient presents for  Chief Complaint  Patient presents with   Macular Degeneration      HISTORY OF PRESENT ILLNESS: Emily Escobar is a 86 y.o. female who presents to the clinic today for:   HPI   5 weeks for DILATE OD, VABYSMO OCT. Pt stated vision has not changed since last visit however, she did get new glasses.  Last edited by Angeline Slim on 06/24/2022  2:18 PM.      Referring physician: Lula Olszewski, MD [address] [place],  [place] [address]  HISTORICAL INFORMATION:   Selected notes from the MEDICAL RECORD NUMBER    Lab Results  Component Value Date   HGBA1C 5.6 04/02/2021     CURRENT MEDICATIONS: No current outpatient medications on file. (Ophthalmic Drugs)   No current facility-administered medications for this visit. (Ophthalmic Drugs)   Current Outpatient Medications (Other)  Medication Sig   acetaminophen (TYLENOL) 500 MG tablet Take 1,000 mg by mouth every 8 (eight) hours as needed.   ALPRAZolam (XANAX) 0.25 MG tablet Take 1 tablet (0.25 mg total) by mouth 2 (two) times daily as needed for anxiety.   ALPRAZolam (XANAX) 0.5 MG tablet Take 1-2 tablets 30 minutes prior to MRI, may repeat once as needed. Must have driver.   amLODipine (NORVASC) 10 MG tablet Take 1 tablet by mouth daily.   atenolol (TENORMIN) 50 MG tablet Take 1 tablet by mouth daily.   atorvastatin (LIPITOR) 20 MG tablet Take 20 mg by mouth daily.   busPIRone (BUSPAR) 5 MG tablet Take 1 tablet by mouth at bedtime.    CLARITIN 10 MG tablet Take 10 mg by mouth every morning.   FLUoxetine (PROZAC) 10 MG capsule Take 1 capsule by mouth daily.   mirtazapine (REMERON) 15 MG tablet Take 7.5 mg by mouth at bedtime.    Multiple Vitamin (MULTIVITAMIN WITH MINERALS) TABS tablet Take 1 tablet by mouth daily.   sertraline (ZOLOFT) 25 MG tablet Take 1 tablet by mouth daily.   traMADol (ULTRAM) 50 MG tablet Take one tablet by mouth every 6 hours as needed for  moderate pain; Take two tablets by mouth every 6 hours as needed for severe pain   No current facility-administered medications for this visit. (Other)      REVIEW OF SYSTEMS: ROS   Negative for: Constitutional, Gastrointestinal, Neurological, Skin, Genitourinary, Musculoskeletal, HENT, Endocrine, Cardiovascular, Eyes, Respiratory, Psychiatric, Allergic/Imm, Heme/Lymph Last edited by Angeline Slim on 06/24/2022  2:18 PM.       ALLERGIES Allergies  Allergen Reactions   Codeine     Pt states she has tolerated norco in the past   Fentanyl Other (See Comments)    Pt reports having hallucinations.   Morphine And Related     Patient's son states pt feels "looping" with morphine     PAST MEDICAL HISTORY Past Medical History:  Diagnosis Date   Anxiety and depression    Closed left femoral fracture (HCC)    History of vertebral fracture    from fall in September 2016   Hypertension    Insomnia    Macular degeneration    Memory loss    Solitary kidney    Past Surgical History:  Procedure Laterality Date   ABDOMINAL HYSTERECTOMY     BLADDER SUSPENSION     BREAST SURGERY     CATARACT EXTRACTION, BILATERAL     EYE SURGERY     GALLBLADDER SURGERY     JOINT REPLACEMENT  NASAL SEPTUM SURGERY     RECONSTRUCTION OF EYELID     RECTOCELE REPAIR     TOTAL HIP ARTHROPLASTY Left 07/17/2016   Procedure: TOTAL HIP ARTHROPLASTY ANTERIOR APPROACH;  Surgeon: Tarry Kos, MD;  Location: [facility] OR;  Service: Orthopedics;  Laterality: Left;    FAMILY HISTORY Family History  Problem Relation Age of Onset   Heart disease Mother    Heart attack Father    Heart disease Brother    Heart disease Brother     SOCIAL HISTORY Social History   Tobacco Use   Smoking status: Never   Smokeless tobacco: Never  Vaping Use   Vaping Use: Never used  Substance Use Topics   Alcohol use: Not Currently   Drug use: No         OPHTHALMIC EXAM:  Base Eye Exam     Visual Acuity (ETDRS)        Right Left   Dist [place] 20/150 -1 20/80   Dist ph [place] 20/80 20/60         Tonometry (Tonopen, 2:24 PM)       Right Left   Pressure 12 15         Pupils       Pupils APD   Right PERRL None   Left PERRL None         Visual Fields       Left Right    Full Full         Extraocular Movement       Right Left    Full Full         Neuro/Psych     Oriented x3: Yes   Mood/Affect: Normal         Dilation     Right eye: 2.5% Phenylephrine, 1.0% Mydriacyl @ 2:24 PM           Slit Lamp and Fundus Exam     External Exam       Right Left   External Normal Normal         Slit Lamp Exam       Right Left   Lids/Lashes Meibomian gland dysfunction Meibomian gland dysfunction   Conjunctiva/Sclera White and quiet White and quiet   Cornea Clear Clear   Anterior Chamber Deep and quiet Deep and quiet   Iris Round and reactive Round and reactive   Lens Centered posterior chamber intraocular lens Centered posterior chamber intraocular lens   Anterior Vitreous Normal Normal         Fundus Exam       Right Left   Posterior Vitreous Normal    Disc Normal    C/D Ratio 0.1    Macula Macular thickening with srf, Drusen, Hard drusen, Intermediate age related macular degeneration, Retinal pigment epithelial mottling    Vessels Normal    Periphery Normal             IMAGING AND PROCEDURES  Imaging and Procedures for 06/24/22  OCT, Retina - OU - Both Eyes       Right Eye Quality was good. Scan locations included subfoveal. Central Foveal Thickness: 254. Progression has improved. Findings include abnormal foveal contour, choroidal neovascular membrane, subretinal fluid.   Left Eye Quality was good. Scan locations included subfoveal. Central Foveal Thickness: 283. Progression has been stable. Findings include no SRF, abnormal foveal contour.   Notes Newly resolved subretinal fluid OD, from CNVM, post change to Vabysmo, will repeat injection today at  5-week interval and examination again in 5 weeks, and will continue on intravitreal medication Vabysmo        Intravitreal Injection, Pharmacologic Agent - OD - Right Eye       Time Out 06/24/2022. 2:38 PM. Confirmed correct patient, procedure, site, and patient consented.   Anesthesia Topical anesthesia was used. Anesthetic medications included Lidocaine 4%.   Procedure Preparation included 5% betadine to ocular surface, 10% betadine to eyelids, Ofloxacin . A 30 gauge needle was used.   Injection: 6 mg faricimab-svoa 6 MG/0.05ML   Route: Intravitreal, Site: Right Eye   NDC: [phone], Lot: [id number], Expiration date: 12/16/2023, Waste: 0 mL   Post-op Post injection exam found visual acuity of at least counting fingers. The patient tolerated the procedure well. There were no complications. The patient received written and verbal post procedure care education. Post injection medications included ocuflox.              ASSESSMENT/PLAN:  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 choroidal neovascularization (HCC) Improved much less subretinal fluid today 5 weeks post recent change to Vabysmo.  Repeat injection today to maintain  Intermediate stage nonexudative age-related macular degeneration of both eyes No sign of CNVM OS  Retinal pigment epithelium serous detachment, right Component of wet AMD improving     ICD-10-CM   1. 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 choroidal neovascularization (HCC)  H35.3211 OCT, Retina - OU - Both Eyes    Intravitreal Injection, Pharmacologic Agent - OD - Right Eye    faricimab-svoa (VABYSMO) 6mg /0.8mL intravitreal injection    2. Intermediate stage nonexudative age-related macular degeneration of both eyes  H35.3132     3. Retinal pigment epithelium serous detachment, right  H35.721       1.  OD subfoveal fluid has now subsided with antivegF change to Vabysmo post injection 1.   Repeat injection today reevaluate next in 5 weeks  2.  OS stable over time OS stable over time  3.  Ophthalmic Meds Ordered this visit:  Meds ordered this encounter  Medications   faricimab-svoa (VABYSMO) 6mg /0.59mL intravitreal injection       Return in about 5 weeks (around 07/29/2022) for dilate, OD, VABYSMO OCT.  There are no Patient Instructions on file for this visit.   Explained the diagnoses, plan, and follow up with the patient and they expressed understanding.  Patient expressed understanding of the importance of proper follow up care.   0m Caitlin Hillmer M.D. Diseases & Surgery of the Retina and Vitreous [facility] 06/24/22     Abbreviations: M myopia (nearsighted); A astigmatism; H hyperopia (farsighted); P presbyopia; Mrx spectacle prescription;  CTL contact lenses; OD right eye; OS left eye; OU both eyes  XT exotropia; ET esotropia; PEK punctate epithelial keratitis; PEE punctate epithelial erosions; DES dry eye syndrome; MGD meibomian gland dysfunction; ATs artificial tears; PFAT's preservative free artificial tears; NSC nuclear sclerotic cataract; PSC posterior subcapsular cataract; ERM epi-retinal membrane; PVD posterior vitreous detachment; RD retinal detachment; DM diabetes mellitus; DR diabetic retinopathy; NPDR non-proliferative diabetic retinopathy; PDR proliferative diabetic retinopathy; CSME clinically significant macular edema; DME diabetic macular edema; dbh dot blot hemorrhages; CWS cotton wool spot; POAG primary open angle glaucoma; C/D cup-to-disc ratio; HVF humphrey visual field; GVF goldmann visual field; OCT optical coherence tomography; IOP intraocular pressure; BRVO Branch retinal vein occlusion; CRVO central retinal vein occlusion; CRAO central retinal artery occlusion; BRAO branch retinal artery occlusion; RT retinal tear; SB scleral buckle;  PPV pars plana vitrectomy; VH Vitreous hemorrhage; PRP panretinal laser photocoagulation; IVK  intravitreal kenalog; VMT vitreomacular traction; MH Macular hole;  NVD neovascularization of the disc; NVE neovascularization elsewhere; AREDS age related eye disease study; ARMD age related macular degeneration; POAG primary open angle glaucoma; EBMD epithelial/anterior basement membrane dystrophy; ACIOL anterior chamber intraocular lens; IOL intraocular lens; PCIOL posterior chamber intraocular lens; Phaco/IOL phacoemulsification with intraocular lens placement; PRK photorefractive keratectomy; LASIK laser assisted in situ keratomileusis; HTN hypertension; DM diabetes mellitus; COPD chronic obstructive pulmonary disease

## 2022-06-24 NOTE — Assessment & Plan Note (Signed)
Component of wet AMD improving 

## 2022-07-29 ENCOUNTER — Ambulatory Visit (INDEPENDENT_AMBULATORY_CARE_PROVIDER_SITE_OTHER): Payer: Medicare Other | Admitting: Ophthalmology

## 2022-07-29 ENCOUNTER — Encounter (INDEPENDENT_AMBULATORY_CARE_PROVIDER_SITE_OTHER): Payer: Self-pay | Admitting: Ophthalmology

## 2022-07-29 DIAGNOSIS — H353211 Exudative age-related macular degeneration, right eye, with active choroidal neovascularization: Secondary | ICD-10-CM

## 2022-07-29 MED ORDER — FARICIMAB-SVOA 6 MG/0.05ML IZ SOLN
6.0000 mg | INTRAVITREAL | Status: AC | PRN
  Administered 2022-07-29: 6 mg via INTRAVITREAL

## 2022-07-29 NOTE — Assessment & Plan Note (Signed)
Chronic active disease now resolved subretinal fluid on Vabysmo.  Injection today again at 5-week interval  We will now extend interval examination next to 12 weeks

## 2022-07-29 NOTE — Progress Notes (Signed)
07/29/2022     CHIEF COMPLAINT Patient presents for  Chief Complaint  Patient presents with   Macular Degeneration      HISTORY OF PRESENT ILLNESS: Emily Escobar is a 86 y.o. female who presents to the clinic today for:   HPI   5 weeks dilate od vabysmo oct Pt states her vision has been stable and is better since she got new glasses  Pt denies any new floaters or FOL   Last edited by Aleene Davidson, CMA on 07/29/2022  2:26 PM.      Referring physician: Lula Olszewski, MD [address] [place],  [place] [address]  HISTORICAL INFORMATION:   Selected notes from the MEDICAL RECORD NUMBER    Lab Results  Component Value Date   HGBA1C 5.6 04/02/2021     CURRENT MEDICATIONS: No current outpatient medications on file. (Ophthalmic Drugs)   No current facility-administered medications for this visit. (Ophthalmic Drugs)   Current Outpatient Medications (Other)  Medication Sig   acetaminophen (TYLENOL) 500 MG tablet Take 1,000 mg by mouth every 8 (eight) hours as needed.   ALPRAZolam (XANAX) 0.25 MG tablet Take 1 tablet (0.25 mg total) by mouth 2 (two) times daily as needed for anxiety.   ALPRAZolam (XANAX) 0.5 MG tablet Take 1-2 tablets 30 minutes prior to MRI, may repeat once as needed. Must have driver.   amLODipine (NORVASC) 10 MG tablet Take 1 tablet by mouth daily.   atenolol (TENORMIN) 50 MG tablet Take 1 tablet by mouth daily.   atorvastatin (LIPITOR) 20 MG tablet Take 20 mg by mouth daily.   busPIRone (BUSPAR) 5 MG tablet Take 1 tablet by mouth at bedtime.    CLARITIN 10 MG tablet Take 10 mg by mouth every morning.   FLUoxetine (PROZAC) 10 MG capsule Take 1 capsule by mouth daily.   mirtazapine (REMERON) 15 MG tablet Take 7.5 mg by mouth at bedtime.    Multiple Vitamin (MULTIVITAMIN WITH MINERALS) TABS tablet Take 1 tablet by mouth daily.   sertraline (ZOLOFT) 25 MG tablet Take 1 tablet by mouth daily.   traMADol (ULTRAM) 50 MG tablet Take one tablet by  mouth every 6 hours as needed for moderate pain; Take two tablets by mouth every 6 hours as needed for severe pain   No current facility-administered medications for this visit. (Other)      REVIEW OF SYSTEMS: ROS   Negative for: Constitutional, Gastrointestinal, Neurological, Skin, Genitourinary, Musculoskeletal, HENT, Endocrine, Cardiovascular, Eyes, Respiratory, Psychiatric, Allergic/Imm, Heme/Lymph Last edited by Aleene Davidson, CMA on 07/29/2022  2:26 PM.       ALLERGIES Allergies  Allergen Reactions   Codeine     Pt states she has tolerated norco in the past   Fentanyl Other (See Comments)    Pt reports having hallucinations.   Morphine And Related     Patient's son states pt feels "looping" with morphine     PAST MEDICAL HISTORY Past Medical History:  Diagnosis Date   Anxiety and depression    Closed left femoral fracture (HCC)    History of vertebral fracture    from fall in September 2016   Hypertension    Insomnia    Macular degeneration    Memory loss    Solitary kidney    Past Surgical History:  Procedure Laterality Date   ABDOMINAL HYSTERECTOMY     BLADDER SUSPENSION     BREAST SURGERY     CATARACT EXTRACTION, BILATERAL     EYE SURGERY  GALLBLADDER SURGERY     JOINT REPLACEMENT     NASAL SEPTUM SURGERY     RECONSTRUCTION OF EYELID     RECTOCELE REPAIR     TOTAL HIP ARTHROPLASTY Left 07/17/2016   Procedure: TOTAL HIP ARTHROPLASTY ANTERIOR APPROACH;  Surgeon: Tarry Kos, MD;  Location: [facility] OR;  Service: Orthopedics;  Laterality: Left;    FAMILY HISTORY Family History  Problem Relation Age of Onset   Heart disease Mother    Heart attack Father    Heart disease Brother    Heart disease Brother     SOCIAL HISTORY Social History   Tobacco Use   Smoking status: Never   Smokeless tobacco: Never  Vaping Use   Vaping Use: Never used  Substance Use Topics   Alcohol use: Not Currently   Drug use: No         OPHTHALMIC EXAM:  Base  Eye Exam     Visual Acuity (ETDRS)       Right Left   Dist [place] CF at 3' 20/80    Correction: Glasses         Tonometry (Tonopen, 2:34 PM)       Right Left   Pressure 14 18         Neuro/Psych     Oriented x3: Yes   Mood/Affect: Normal         Dilation     Right eye: 2.5% Phenylephrine, 1.0% Mydriacyl @ 2:27 PM           Slit Lamp and Fundus Exam     External Exam       Right Left   External Normal Normal         Slit Lamp Exam       Right Left   Lids/Lashes Meibomian gland dysfunction Meibomian gland dysfunction   Conjunctiva/Sclera White and quiet White and quiet   Cornea Clear Clear   Anterior Chamber Deep and quiet Deep and quiet   Iris Round and reactive Round and reactive   Lens Centered posterior chamber intraocular lens Centered posterior chamber intraocular lens   Anterior Vitreous Normal Normal         Fundus Exam       Right Left   Posterior Vitreous Normal    Disc Normal    C/D Ratio 0.1    Macula Macular thickening with srf, Drusen, Hard drusen, Intermediate age related macular degeneration, Retinal pigment epithelial mottling    Vessels Normal    Periphery Normal             IMAGING AND PROCEDURES  Imaging and Procedures for 07/29/22  OCT, Retina - OU - Both Eyes       Right Eye Quality was good. Scan locations included subfoveal. Central Foveal Thickness: 252. Progression has improved. Findings include abnormal foveal contour, choroidal neovascular membrane, subretinal fluid.   Left Eye Quality was good. Scan locations included subfoveal. Central Foveal Thickness: 284. Progression has been stable. Findings include no SRF, abnormal foveal contour.   Notes Newly resolved subretinal fluid OD, from CNVM, post change to Vabysmo, will repeat injection today at 5-week interval and examination again in 12 weeks, and will continue on intravitreal medication Vabysmo        Intravitreal Injection, Pharmacologic Agent -  OD - Right Eye       Time Out 07/29/2022. 3:33 PM. Confirmed correct patient, procedure, site, and patient consented.   Anesthesia Topical anesthesia was used. Anesthetic medications included Lidocaine 4%.  Procedure Preparation included 5% betadine to ocular surface, 10% betadine to eyelids, Ofloxacin . A 30 gauge needle was used.   Injection: 6 mg faricimab-svoa 6 MG/0.05ML   Route: Intravitreal, Site: Right Eye   NDC: [phone], Lot: [id number], Expiration date: 04/14/2024, Waste: 0 mL   Post-op Post injection exam found visual acuity of at least counting fingers. The patient tolerated the procedure well. There were no complications. The patient received written and verbal post procedure care education. Post injection medications included ocuflox.              ASSESSMENT/PLAN:  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 choroidal neovascularization (HCC) Chronic active disease now resolved subretinal fluid on Vabysmo.  Injection today again at 5-week interval  We will now extend interval examination next to 12 weeks     ICD-10-CM   1. 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 choroidal neovascularization (HCC)  H35.3211 OCT, Retina - OU - Both Eyes    Intravitreal Injection, Pharmacologic Agent - OD - Right Eye    faricimab-svoa (VABYSMO) 6mg /0.35mL intravitreal injection      1.  OD significantly improved since change to [facility] June 2023.  Repeat injection today and we will extend interval examination to 12 weeks given the level of visual acuity and the impressive OCT appearance today  2.  3.  Ophthalmic Meds Ordered this visit:  Meds ordered this encounter  Medications   faricimab-svoa (VABYSMO) 6mg /0.3mL intravitreal injection       Return in about 12 weeks (around 10/21/2022) for DILATE OU, VABYSMO OCT, OD.  There are no Patient Instructions on file for this visit.   Explained the diagnoses, plan,  and follow up with the patient and they expressed understanding.  Patient expressed understanding of the importance of proper follow up care.   0m Sunjai Levandoski M.D. Diseases & Surgery of the Retina and Vitreous [facility] 07/29/22     Abbreviations: M myopia (nearsighted); A astigmatism; H hyperopia (farsighted); P presbyopia; Mrx spectacle prescription;  CTL contact lenses; OD right eye; OS left eye; OU both eyes  XT exotropia; ET esotropia; PEK punctate epithelial keratitis; PEE punctate epithelial erosions; DES dry eye syndrome; MGD meibomian gland dysfunction; ATs artificial tears; PFAT's preservative free artificial tears; NSC nuclear sclerotic cataract; PSC posterior subcapsular cataract; ERM epi-retinal membrane; PVD posterior vitreous detachment; RD retinal detachment; DM diabetes mellitus; DR diabetic retinopathy; NPDR non-proliferative diabetic retinopathy; PDR proliferative diabetic retinopathy; CSME clinically significant macular edema; DME diabetic macular edema; dbh dot blot hemorrhages; CWS cotton wool spot; POAG primary open angle glaucoma; C/D cup-to-disc ratio; HVF humphrey visual field; GVF goldmann visual field; OCT optical coherence tomography; IOP intraocular pressure; BRVO Branch retinal vein occlusion; CRVO central retinal vein occlusion; CRAO central retinal artery occlusion; BRAO branch retinal artery occlusion; RT retinal tear; SB scleral buckle; PPV pars plana vitrectomy; VH Vitreous hemorrhage; PRP panretinal laser photocoagulation; IVK intravitreal kenalog; VMT vitreomacular traction; MH Macular hole;  NVD neovascularization of the disc; NVE neovascularization elsewhere; AREDS age related eye disease study; ARMD age related macular degeneration; POAG primary open angle glaucoma; EBMD epithelial/anterior basement membrane dystrophy; ACIOL anterior chamber intraocular lens; IOL intraocular lens; PCIOL posterior chamber intraocular lens; Phaco/IOL  phacoemulsification with intraocular lens placement; PRK photorefractive keratectomy; LASIK laser assisted in situ keratomileusis; HTN hypertension; DM diabetes mellitus; COPD chronic obstructive pulmonary disease

## 2022-08-20 ENCOUNTER — Other Ambulatory Visit: Payer: Self-pay

## 2022-08-20 ENCOUNTER — Encounter (HOSPITAL_COMMUNITY): Payer: Self-pay | Admitting: Emergency Medicine

## 2022-08-20 ENCOUNTER — Emergency Department (HOSPITAL_COMMUNITY): Payer: Medicare Other

## 2022-08-20 ENCOUNTER — Emergency Department (HOSPITAL_COMMUNITY)
Admission: EM | Admit: 2022-08-20 | Discharge: 2022-08-20 | Disposition: A | Discharge status: Home / Self Care | Payer: Medicare Other | Attending: Student | Admitting: Student

## 2022-08-20 DIAGNOSIS — Z79899 Other long term (current) drug therapy: Secondary | ICD-10-CM | POA: Insufficient documentation

## 2022-08-20 DIAGNOSIS — W19XXXA Unspecified fall, initial encounter: Secondary | ICD-10-CM | POA: Diagnosis not present

## 2022-08-20 DIAGNOSIS — N390 Urinary tract infection, site not specified: Secondary | ICD-10-CM | POA: Diagnosis not present

## 2022-08-20 DIAGNOSIS — S01111A Laceration without foreign body of right eyelid and periocular area, initial encounter: Secondary | ICD-10-CM | POA: Diagnosis not present

## 2022-08-20 DIAGNOSIS — Z96642 Presence of left artificial hip joint: Secondary | ICD-10-CM | POA: Insufficient documentation

## 2022-08-20 DIAGNOSIS — S0181XA Laceration without foreign body of other part of head, initial encounter: Secondary | ICD-10-CM

## 2022-08-20 DIAGNOSIS — I10 Essential (primary) hypertension: Secondary | ICD-10-CM | POA: Diagnosis not present

## 2022-08-20 DIAGNOSIS — S0990XA Unspecified injury of head, initial encounter: Secondary | ICD-10-CM | POA: Diagnosis present

## 2022-08-20 LAB — CBC WITH DIFFERENTIAL/PLATELET
Abs Immature Granulocytes: 0.03 10*3/uL (ref 0.00–0.07)
Basophils Absolute: 0 10*3/uL (ref 0.0–0.1)
Basophils Relative: 0 %
Eosinophils Absolute: 0.1 10*3/uL (ref 0.0–0.5)
Eosinophils Relative: 2 %
HCT: 46.3 % — ABNORMAL HIGH (ref 36.0–46.0)
Hemoglobin: 14.8 g/dL (ref 12.0–15.0)
Immature Granulocytes: 0 %
Lymphocytes Relative: 15 %
Lymphs Abs: 1.3 10*3/uL (ref 0.7–4.0)
MCH: 29.1 pg (ref 26.0–34.0)
MCHC: 32 g/dL (ref 30.0–36.0)
MCV: 91 fL (ref 80.0–100.0)
Monocytes Absolute: 0.7 10*3/uL (ref 0.1–1.0)
Monocytes Relative: 8 %
Neutro Abs: 6.1 10*3/uL (ref 1.7–7.7)
Neutrophils Relative %: 75 %
Platelets: 144 10*3/uL — ABNORMAL LOW (ref 150–400)
RBC: 5.09 MIL/uL (ref 3.87–5.11)
RDW: 13.5 % (ref 11.5–15.5)
WBC: 8.2 10*3/uL (ref 4.0–10.5)
nRBC: 0 % (ref 0.0–0.2)

## 2022-08-20 LAB — COMPREHENSIVE METABOLIC PANEL
ALT: 26 U/L (ref 0–44)
AST: 22 U/L (ref 15–41)
Albumin: 4 g/dL (ref 3.5–5.0)
Alkaline Phosphatase: 78 U/L (ref 38–126)
Anion gap: 6 (ref 5–15)
BUN: 21 mg/dL (ref 8–23)
CO2: 26 mmol/L (ref 22–32)
Calcium: 9.3 mg/dL (ref 8.9–10.3)
Chloride: 110 mmol/L (ref 98–111)
Creatinine, Ser: 0.83 mg/dL (ref 0.44–1.00)
GFR, Estimated: >60 mL/min (ref >60)
Glucose, Bld: 100 mg/dL — ABNORMAL HIGH (ref 70–99)
Potassium: 4.3 mmol/L (ref 3.5–5.1)
Sodium: 142 mmol/L (ref 135–145)
Total Bilirubin: 0.8 mg/dL (ref 0.3–1.2)
Total Protein: 6.4 g/dL — ABNORMAL LOW (ref 6.5–8.1)

## 2022-08-20 LAB — URINALYSIS, ROUTINE W REFLEX MICROSCOPIC
Bilirubin Urine: NEGATIVE
Glucose, UA: NEGATIVE mg/dL
Hgb urine dipstick: NEGATIVE
Ketones, ur: NEGATIVE mg/dL
Nitrite: POSITIVE — AB
Protein, ur: NEGATIVE mg/dL
Specific Gravity, Urine: 1.008 (ref 1.005–1.030)
WBC, UA: >50 WBC/hpf — ABNORMAL HIGH (ref 0–5)
pH: 7 (ref 5.0–8.0)

## 2022-08-20 MED ORDER — FOSFOMYCIN TROMETHAMINE 3 G PO PACK
3.0000 g | PACK | Freq: Once | ORAL | Status: AC
  Administered 2022-08-20: 3 g via ORAL
  Filled 2022-08-20: qty 3

## 2022-08-20 NOTE — ED Triage Notes (Signed)
BIB EMS Pt from [facility]. Was sitting outside today and slipped out of her chair striking the right side of her head, small lac noted. Bleeding controlled.  No other injuries but pain to her right head.

## 2022-08-20 NOTE — ED Provider Notes (Signed)
[facility]  HOSPITAL-EMERGENCY DEPT Provider Note  CSN: [id number] Arrival date & time: 08/20/22 1728  Chief Complaint(s) Fall  HPI Emily Escobar is a 86 y.o. female with PMH HTN, macular degeneration, memory loss who presents emergency department for evaluation of a fall.  Patient states that she was sitting and fell forward striking her head on the ground.  She states this is not the first time this is happened.  She denies associated nausea, vomiting, syncope, loss of consciousness, numbness, tingling, weakness or other neurologic or systemic complaints.  Patient arrives with a small laceration to the lateral orbit but there is no other overt evidence of trauma.   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 Authorizing Provider  acetaminophen (TYLENOL) 500 MG tablet Take 1,000 mg by mouth every 8 (eight) hours as needed.    [provider]  ALPRAZolam Prudy Feeler) 0.25 MG tablet  Take 1 tablet (0.25 mg total) by mouth 2 (two) times daily as needed for anxiety. 07/22/16   Sharon Seller, NP  ALPRAZolam Prudy Feeler) 0.5 MG tablet Take 1-2 tablets 30 minutes prior to MRI, may repeat once as needed. Must have driver. 04/02/21   Levert Feinstein, MD  amLODipine (NORVASC) 10 MG tablet Take 1 tablet by mouth daily. 06/25/16   [provider]  atenolol (TENORMIN) 50 MG tablet Take 1 tablet by mouth daily. 07/02/16   [provider]  atorvastatin (LIPITOR) 20 MG tablet Take 20 mg by mouth daily. 08/16/20   [provider]  busPIRone (BUSPAR) 5 MG tablet Take 1 tablet by mouth at bedtime.  06/21/16   [provider]  CLARITIN 10 MG tablet Take 10 mg by mouth every morning. 08/16/20   [provider]  FLUoxetine (PROZAC) 10 MG capsule Take 1 capsule by mouth daily.    [provider]  mirtazapine (REMERON) 15 MG tablet Take 7.5 mg by mouth at bedtime.  05/29/16   [provider]  Multiple Vitamin (MULTIVITAMIN WITH MINERALS) TABS tablet Take 1 tablet by mouth daily.    [provider]  sertraline (ZOLOFT) 25 MG tablet Take 1 tablet by mouth daily. 07/03/16   [provider]  traMADol (ULTRAM) 50 MG tablet Take one tablet by mouth every 6 hours as needed for moderate pain; Take two tablets by mouth every 6 hours as needed for severe pain 07/23/16   Kimber Relic, MD  Past Surgical History Past Surgical History:  Procedure Laterality Date   ABDOMINAL HYSTERECTOMY     BLADDER SUSPENSION     BREAST SURGERY     CATARACT EXTRACTION, BILATERAL     EYE SURGERY     GALLBLADDER SURGERY     JOINT REPLACEMENT     NASAL SEPTUM SURGERY     RECONSTRUCTION OF EYELID     RECTOCELE REPAIR     TOTAL HIP ARTHROPLASTY Left 07/17/2016   Procedure: TOTAL HIP ARTHROPLASTY ANTERIOR APPROACH;  Surgeon: Tarry Kos,  MD;  Location: [facility] OR;  Service: Orthopedics;  Laterality: Left;   Family History Family History  Problem Relation Age of Onset   Heart disease Mother    Heart attack Father    Heart disease Brother    Heart disease Brother     Social History Social History   Tobacco Use   Smoking status: Never   Smokeless tobacco: Never  Vaping Use   Vaping Use: Never used  Substance Use Topics   Alcohol use: Not Currently   Drug use: No   Allergies Codeine, Fentanyl, and Morphine and related  Review of Systems Review of Systems  Skin:  Positive for wound.    Physical Exam Vital Signs  I have reviewed the triage vital signs BP (!) 171/78   Pulse 67   Temp 98.7 F (37.1 C) (Oral)   Resp 18   SpO2 97%   Physical Exam Vitals and nursing note reviewed.  Constitutional:      General: She is not in acute distress.    Appearance: She is well-developed.  HENT:     Head: Normocephalic.     Comments: Small laceration to the right orbit Eyes:     Conjunctiva/sclera: Conjunctivae normal.  Cardiovascular:     Rate and Rhythm: Normal rate and regular rhythm.     Heart sounds: No murmur heard. Pulmonary:     Effort: Pulmonary effort is normal. No respiratory distress.     Breath sounds: Normal breath sounds.  Abdominal:     Palpations: Abdomen is soft.     Tenderness: There is no abdominal tenderness.  Musculoskeletal:        General: No swelling.     Cervical back: Neck supple.  Skin:    General: Skin is warm and dry.     Capillary Refill: Capillary refill takes less than 2 seconds.  Neurological:     Mental Status: She is alert.  Psychiatric:        Mood and Affect: Mood normal.    ED Results and Treatments Labs (all labs ordered are listed, but only abnormal results are displayed) Labs Reviewed  COMPREHENSIVE METABOLIC PANEL  CBC WITH DIFFERENTIAL/PLATELET                                                                                                                           Radiology No results found.  Pertinent labs & imaging results that were  available during my care of the patient were reviewed by me and considered in my medical decision making (see MDM for details).  Medications Ordered in ED Medications - No data to display                                                                                                                                   Procedures .Marland KitchenLaceration Repair  Date/Time: 08/20/2022 10:50 PM  Performed by: Glendora Score, MD Authorized by: Glendora Score, MD   Laceration details:    Location:  Face   Facial location: Right lateral orbit.   Length (cm):  2 Pre-procedure details:    Preparation:  Patient was prepped and draped in usual sterile fashion Treatment:    Area cleansed with:  Saline   Amount of cleaning:  Standard   Irrigation method:  Pressure wash Skin repair:    Repair method:  Tissue adhesive and Steri-Strips   Number of Steri-Strips:  1 Approximation:    Approximation:  Close Repair type:    Repair type:  Simple Post-procedure details:    Dressing:  Open (no dressing)   Procedure completion:  Tolerated well, no immediate complications   (including critical care time)  Medical Decision Making / ED Course   This patient presents to the ED for concern of fall, facial laceration, this involves an extensive number of treatment options, and is a complaint that carries with it a high risk of complications and morbidity.  The differential diagnosis includes closed head injury, facial laceration, electrolyte abnormality, UTI  MDM: Seen emergency room for evaluation of a fall.  Physical exam with a 2 cm laceration to the lateral aspect of the right orbit.  CT head, C-spine and max face without acute fracture or intracranial bleed (right lateral ventricle finding favored to be calcification).  Laboratory evaluation largely unremarkable.  Urinalysis nitrate positive, large leuk esterase with greater than  50 white blood cells and many bacteria.  Patient given single dose fosfomycin for UTI and laceration repaired at bedside.  Patient then discharged.   Additional history obtained: -Additional history obtained from multiple family members -External records from outside source obtained and reviewed including: Chart review including previous notes, labs, imaging, consultation notes   Lab Tests: -I ordered, reviewed, and interpreted labs.   The pertinent results include:   Labs Reviewed  COMPREHENSIVE METABOLIC PANEL  CBC WITH DIFFERENTIAL/PLATELET     Imaging Studies ordered: I ordered imaging studies including CT head, max face, C-spine I independently visualized and interpreted imaging. I agree with the radiologist interpretation   Medicines ordered and prescription drug management: No orders of the defined types were placed in this encounter.   -I have reviewed the patients home medicines and have made adjustments as needed  Critical interventions none   Cardiac Monitoring: The patient was maintained on a cardiac monitor.  I personally viewed and interpreted the cardiac monitored which showed an underlying rhythm  of: NSR  Social Determinants of Health:  Factors impacting patients care include: none   Reevaluation: After the interventions noted above, I reevaluated the patient and found that they have :improved  Co morbidities that complicate the patient evaluation  Past Medical History:  Diagnosis Date   Anxiety and depression    Closed left femoral fracture (HCC)    History of vertebral fracture    from fall in September 2016   Hypertension    Insomnia    Macular degeneration    Memory loss    Solitary kidney       Dispostion: I considered admission for this patient, but she does not meet inpatient criteria for admission is safe for discharge with outpatient follow-up     Final Clinical Impression(s) / ED Diagnoses Final diagnoses:  None      @PCDICTATION @    , MD 08/20/22 2252

## 2022-08-22 LAB — URINE CULTURE: Culture: >=100000 — AB

## 2022-08-23 ENCOUNTER — Telehealth: Payer: Self-pay | Admitting: Emergency Medicine

## 2022-08-23 NOTE — Telephone Encounter (Signed)
Post ED Visit - Positive Culture Follow-up  Culture report reviewed by antimicrobial stewardship pharmacist: [facility] []  , Pharm.D. []  Enzo Bi, Pharm.D., BCPS AQ-ID []  , Pharm.D., BCPS []  Celedonio Miyamoto, Pharm.D., BCPS []  [place], Garvin Fila.D., BCPS, AAHIVP []  , Pharm.D., BCPS, AAHIVP []  Georgina Pillion, PharmD, BCPS []  , PharmD, BCPS []  [place], PharmD, BCPS []  [address], PharmD []  , PharmD, BCPS []  Estella Husk, PharmD  Pharmacy Team []  Lysle Pearl, PharmD []  , PharmD []  Phillips Climes, PharmD []  , Rph []  Agapito Games) , PharmD []  Verlan Friends, PharmD []  , PharmD [x]  Mervyn Gay, PharmD []  , PharmD []  Vinnie Level, PharmD []  [facility], PharmD []  , PharmD []  Len Childs, PharmD   Positive urine culture Treated with fosfomycin, organism sensitive to the same and no further patient follow-up is required at this time.  08/23/2022, 10:24 AM

## 2022-09-04 ENCOUNTER — Emergency Department (HOSPITAL_COMMUNITY): Payer: Medicare Other

## 2022-09-04 ENCOUNTER — Emergency Department (HOSPITAL_COMMUNITY)
Admission: EM | Admit: 2022-09-04 | Discharge: 2022-09-04 | Disposition: A | Discharge status: Home / Self Care | Payer: Medicare Other | Attending: Emergency Medicine | Admitting: Emergency Medicine

## 2022-09-04 ENCOUNTER — Encounter (HOSPITAL_COMMUNITY): Payer: Self-pay

## 2022-09-04 ENCOUNTER — Other Ambulatory Visit: Payer: Self-pay

## 2022-09-04 DIAGNOSIS — Z79899 Other long term (current) drug therapy: Secondary | ICD-10-CM | POA: Insufficient documentation

## 2022-09-04 DIAGNOSIS — M545 Low back pain, unspecified: Secondary | ICD-10-CM | POA: Insufficient documentation

## 2022-09-04 DIAGNOSIS — R42 Dizziness and giddiness: Secondary | ICD-10-CM | POA: Insufficient documentation

## 2022-09-04 DIAGNOSIS — W19XXXA Unspecified fall, initial encounter: Secondary | ICD-10-CM | POA: Insufficient documentation

## 2022-09-04 DIAGNOSIS — S32010A Wedge compression fracture of first lumbar vertebra, initial encounter for closed fracture: Secondary | ICD-10-CM

## 2022-09-04 DIAGNOSIS — I10 Essential (primary) hypertension: Secondary | ICD-10-CM | POA: Insufficient documentation

## 2022-09-04 DIAGNOSIS — S299XXA Unspecified injury of thorax, initial encounter: Secondary | ICD-10-CM | POA: Diagnosis present

## 2022-09-04 DIAGNOSIS — R102 Pelvic and perineal pain: Secondary | ICD-10-CM | POA: Insufficient documentation

## 2022-09-04 DIAGNOSIS — S22080A Wedge compression fracture of T11-T12 vertebra, initial encounter for closed fracture: Secondary | ICD-10-CM | POA: Insufficient documentation

## 2022-09-04 MED ORDER — DICLOFENAC EPOLAMINE 1.3 % EX PTCH
1.0000 | MEDICATED_PATCH | Freq: Two times a day (BID) | CUTANEOUS | Status: DC
  Filled 2022-09-04: qty 1

## 2022-09-04 MED ORDER — ACETAMINOPHEN 500 MG PO TABS
1000.0000 mg | ORAL_TABLET | Freq: Once | ORAL | Status: AC
  Administered 2022-09-04: 1000 mg via ORAL
  Filled 2022-09-04: qty 2

## 2022-09-04 NOTE — Discharge Instructions (Addendum)
As discussed, today's evaluation has been generally reassuring.  However, your x-rays did demonstrate evidence for a compression fracture.  This may have been present for some time, with pain exacerbation due to today's fall.  Typically topical pain medication and Tylenol are sufficient for pain control.  For additional relief consider following up with our orthopedic colleagues.  Return here for concerning changes in your condition.  Be sure to obtain the over-the-counter Salonpas product with methyl salicylate, and menthol.

## 2022-09-04 NOTE — ED Triage Notes (Signed)
[facility] reports pt coming from [facility]. Pt fell 2x today and c/o lower back pain. Pt is COVID +.

## 2022-09-04 NOTE — ED Provider Notes (Signed)
[facility]-EMERGENCY DEPT Provider Note   CSN: [id number] Arrival date & time: 09/04/22  1410     History  Chief Complaint  Patient presents with   Emily Escobar    Reida Hem is a 86 y.o. female.  HPI Patient presents with her daughter-in-law who assists with the history.  Patient has baseline weakness, dizziness, has been seen, evaluated, and is following up with neurology.  No recent medication change, diet change.  Since a fall 2 weeks ago she has been doing generally well, but today had another fall.  She recalls the entire to the fall denies head trauma, new focal weakness, has pain in her lower back, and posterior pelvis, but no other complaints.  No medication taken for relief.    Home Medications Prior to Admission medications   Medication Sig Start Date End Date Taking? Authorizing Provider  acetaminophen (TYLENOL) 500 MG tablet Take 1,000 mg by mouth every 8 (eight) hours as needed.    [provider]  ALPRAZolam Prudy Feeler) 0.25 MG tablet Take 1 tablet (0.25 mg total) by mouth 2 (two) times daily as needed for anxiety. 07/22/16   Sharon Seller, NP  ALPRAZolam Prudy Feeler) 0.5 MG tablet Take 1-2 tablets 30 minutes prior to MRI, may repeat once as needed. Must have driver. 04/02/21   Levert Feinstein, MD  amLODipine (NORVASC) 10 MG tablet Take 1 tablet by mouth daily. 06/25/16   [provider]  atenolol (TENORMIN) 50 MG tablet Take 1 tablet by mouth daily. 07/02/16   [provider]  atorvastatin (LIPITOR) 20 MG tablet Take 20 mg by mouth daily. 08/16/20   [provider]  busPIRone (BUSPAR) 5 MG tablet Take 1 tablet by mouth at bedtime.  06/21/16   [provider]  CLARITIN 10 MG tablet Take 10 mg by mouth every morning. 08/16/20   [provider]  FLUoxetine (PROZAC) 10 MG capsule Take 1 capsule by mouth daily.    [provider]  mirtazapine (REMERON) 15 MG tablet Take 7.5 mg by mouth at bedtime.  05/29/16    [provider]  Multiple Vitamin (MULTIVITAMIN WITH MINERALS) TABS tablet Take 1 tablet by mouth daily.    [provider]  sertraline (ZOLOFT) 25 MG tablet Take 1 tablet by mouth daily. 07/03/16   [provider]  traMADol (ULTRAM) 50 MG tablet Take one tablet by mouth every 6 hours as needed for moderate pain; Take two tablets by mouth every 6 hours as needed for severe pain 07/23/16   Kimber Relic, MD      Allergies    Codeine, Fentanyl, and Morphine and related    Review of Systems   Review of Systems  All other systems reviewed and are negative.   Physical Exam Updated Vital Signs BP (!) 176/65 (BP Location: Right Arm)   Pulse 68   Temp (!) 97.3 F (36.3 C) (Oral)   Resp 18   Ht 5\' 2"  (1.575 m)   Wt 77.6 kg   SpO2 91%   BMI 31.28 kg/m  Physical Exam Vitals and nursing note reviewed.  Constitutional:      General: She is not in acute distress.    Appearance: She is well-developed. She is obese.  HENT:     Head: Normocephalic and atraumatic.  Eyes:     Conjunctiva/sclera: Conjunctivae normal.  Cardiovascular:     Rate and Rhythm: Normal rate and regular rhythm.  Pulmonary:     Effort: Pulmonary effort is normal. No  respiratory distress.     Breath sounds: Normal breath sounds. No stridor.  Abdominal:     General: There is no distension.  Musculoskeletal:       Arms:  Skin:    General: Skin is warm and dry.  Neurological:     Mental Status: She is alert and oriented to person, place, and time.     Cranial Nerves: No cranial nerve deficit or facial asymmetry.     Comments: Speech is slow, but clear, face is symmetric.  There is age-appropriate atrophy, no tremor and the patient moves all extremities spontaneously to command.  Psychiatric:        Mood and Affect: Mood normal.        Behavior: Behavior is slowed.     ED Results / Procedures / Treatments   Labs (all labs ordered are listed, but only abnormal results are  displayed) Labs Reviewed - No data to display  EKG None  Radiology No results found.  Procedures Procedures    Medications Ordered in ED Medications  acetaminophen (TYLENOL) tablet 1,000 mg (has no administration in time range)    ED Course/ Medical Decision Making/ A&P This patient with a Hx of hypertension, falls, dizziness presents to the ED for concern of pain in her back, pelvis following a fall, this involves an extensive number of treatment options, and is a complaint that carries with it a high risk of complications and morbidity.    The differential diagnosis includes fracture, nervous system disruption, such as radiculopathy, future peritoneal injury   Social Determinants of Health:  Obesity, age, dizziness  Additional history obtained:  Additional history and/or information obtained from daughter at bedside, notable for as included in HPI   After the initial evaluation, orders, including: X-ray, Tylenol were initiated.   Patient placed on Cardiac and Pulse-Oximetry Monitors. The patient was maintained on a cardiac monitor.  The cardiac monitored showed an rhythm of 70 sinus normal The patient was also maintained on pulse oximetry. The readings were typically 100% room air normal   On repeat evaluation of the patient stayed the same.  Is awake, alert.  With recent urinary tract infection we discussed this possibility, the patient states that she is fine, declines testing.  Absent hemodynamic instability, fever, with her no complaints, no urinalysis tested today.  Patient, daughter, aware of x-ray suggesting compression fracture, mild, T12, L1, unclear acuity.  Patient will start diclofenac patch, in addition to Tylenol. Imaging Studies ordered:  I independently visualized and interpreted imaging which showed age-indeterminate T12, L1 compression fractures I agree with the radiologist interpretation Dispostion / Final MDM:  After consideration of the  diagnostic results and the patient's response to treatment, female with baseline dizziness, unsteadiness, weakness now presents after mechanical fall during which she fell on her backside.  No head trauma, and no new weakness as well as patches of hours since the event are reassuring, low suspicion for new intracranial abnormality.  Patient's x-rays do not demonstrate fracture, and she is able to move her hips, legs appropriately to command.  Patient discharged with ongoing anti-inflammatories.  Final Clinical Impression(s) / ED Diagnoses Final diagnoses:  Fall, initial encounter    Rx / DC Orders ED Discharge Orders     None         Carmin Muskrat, MD 09/04/22 1645

## 2022-09-17 ENCOUNTER — Ambulatory Visit
Admission: RE | Admit: 2022-09-17 | Discharge: 2022-09-17 | Disposition: A | Discharge status: Home / Self Care | Payer: Medicare Other | Source: Ambulatory Visit | Attending: Family Medicine | Admitting: Family Medicine

## 2022-09-17 ENCOUNTER — Other Ambulatory Visit: Payer: Self-pay | Admitting: Family Medicine

## 2022-09-17 DIAGNOSIS — M25561 Pain in right knee: Secondary | ICD-10-CM

## 2022-09-23 ENCOUNTER — Encounter: Payer: Self-pay | Admitting: Neurology

## 2022-09-23 ENCOUNTER — Ambulatory Visit: Payer: Medicare Other | Admitting: Neurology

## 2022-09-23 VITALS — BP 181/81 | HR 61 | Ht 62.0 in | Wt 159.0 lb

## 2022-09-23 DIAGNOSIS — G20C Parkinsonism, unspecified: Secondary | ICD-10-CM

## 2022-09-23 DIAGNOSIS — R4189 Other symptoms and signs involving cognitive functions and awareness: Secondary | ICD-10-CM | POA: Diagnosis not present

## 2022-09-23 MED ORDER — CARBIDOPA-LEVODOPA 25-100 MG PO TABS: 1.0000 | ORAL_TABLET | Freq: Three times a day (TID) | ORAL | 6 refills | Status: DC

## 2022-09-23 NOTE — Progress Notes (Signed)
ASSESSMENT AND PLAN  Emily Escobar is a 86 y.o. 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  CT head without contrast in Dec 2021.  No acute abnormality, mild supratentorium small vessel disease  Laboratory evaluation in May 2021: Normal CMP, CBC, hemoglobin of 13.8, creatinine 0.9, negative troponin, negative D-dimer, A1c of 5.1, normal TSH  More extensive laboratory evaluation on April 02, 2021: Normal CMP, creatinine 0.87, vitamin D 32.5, normal CBC, hemoglobin of 14.9, protein electrophoresis, copper, ANA, ferritin, A1c 5.6, normal ESR, CPK, TSH, C-reactive protein, folic acid, vitamin L27, RPR,  MRI of brain from [facility] in May 2022: No acute abnormality, moderate generalized atrophy, mild supratentorium small vessel disease  MRI of cervical spine showed multilevel degenerative changes, no significant canal stenosis variable degree of foraminal narrowing, most obvious at right C4-5  HISTORICAL  Emily Escobar, is a 86 year old female, accompanied by her daughter-in-law Emily Escobar, seen in request by her primary care physician Dr. Buzzy Han for evaluation of gait abnormality, frequent falling, memory loss, April 02, 2021  I reviewed and  summarized the referring note. PMHX. HTN HLD Depression,  She was on polypharmacy, tapering off wellbutrin.  Patient used to live in [place], moved to independent living in September 2021 to be close to her son, she is a retired Theme park manager, family history of dementia, mother suffered dementia  She was noted to have gradual onset of memory loss for about 10 years, gradually getting worse, misplace things, feels frustrated, no also complains of increased gait abnormality, begin to rely on her walker,  Today's MoCA examination was 17 out of 30,  CT head in December 2021 showed no acute abnormality, mild generalized atrophy supratentorium small vessel disease  Patient reported irregular sleep pattern, staying up late, sleeping late, often losing her breakfast poor appetite, not enough water intake, she has bilateral lower extremity paresthesia, slow worsening gait abnormality  Update May 01, 2021: She is accompanied by her daughter-in-law at today's visit, overall slow worsening gait abnormality, slurred speech, Electrodiagnostic study today showed no significant abnormality, no evidence of large fiber peripheral neuropathy, right cervical or lumbosacral radiculopathy MRI of the brain showed mild generalized atrophy supratentorium small vessel disease no acute abnormality  MRI of cervical spine showed multilevel degenerative changes, no evidence of canal stenosis, Laboratory evaluation showed no treatable etiology  She is most concerned about her slow worsening gait abnormality, tendency to fall backwards,  UPDATE Sep 23 2022: She is with her daughter in law, she has worsening memory loss, multiple falls, decreased functional status, fluctuation of her mentation, slow reaction time  She presented to emergency room on September 04, 2022 after fall, reviewed CT head without contrast, no acute abnormality, generalized atrophy, nonspecific, upper brainstem significant atrophy, CT cervical spine  showed multilevel degenerative changes, no significant canal stenosis CT of maxillary sinus showed left  sphenoid sinus air-fluid level with bubbly fluid, can be associated with acute sinusitis  She was noted to have more slurred speech, today's examination showed symmetric bilateral bradykinesia, rigidity, slow reaction time, significant upper abdominal vertical gaze difficulty,  REVIEW OF SYSTEMS:  Full 14 system review of systems performed and notable only for as above All other review of systems were negative.  PHYSICAL EXAM:   Vitals:   09/23/22 1042  BP: (!) 181/81  Pulse: 61  Weight: 159 lb (72.1 kg)  Height: 5' 2" (1.575 m)    Body mass index is 29.08 kg/m.  Sitting down 158/89, HR 58, 140/100,65; 168/84, 64, 166/80, 62, 158/74, 62  PHYSICAL EXAMNIATION:  Gen: NAD, conversant, well nourised, well groomed                     Cardiovascular: Regular rate rhythm, no peripheral edema, warm, nontender. Eyes: Conjunctivae clear without exudates or hemorrhage Neck: Supple, no carotid bruits. Pulmonary: Clear to auscultation bilaterally   NEUROLOGICAL EXAM:  MENTAL STATUS: Speech: slow to talk, slow spastic tongue movement, decreased facial expression,      09/23/2022   10:47 AM 04/02/2021   10:26 AM  Montreal Cognitive Assessment   Visuospatial/ Executive (0/5) 1 4  Naming (0/3) 2 2  Attention: Read list of digits (0/2) 1 2  Attention: Read list of letters (0/1) 1 1  Attention: Serial 7 subtraction starting at 100 (0/3) 1 3  Language: Repeat phrase (0/2) 1 0  Language : Fluency (0/1) 0 0  Abstraction (0/2) 1 0  Delayed Recall (0/5) 1 1  Orientation (0/6) 2 4  Total 11 17  Adjusted Score (based on education)  17   CRANIAL NERVES: CN II: Visual fields are full to confrontation. Pupils are round equal and briskly reactive to light. CN III, IV, VI: Significant vertical gaze palsy, horizontal smooth pursuit eye movement was Broken into Small Catch up saccade CN V:  Facial sensation is intact to light touch CN VII: Face is symmetric with normal eye closure  CN VIII: Hearing is normal to causal conversation. CN IX, X: Phonation is normal. CN XI: Head turning and shoulder shrug are intact CN XII: slow spastic tongue movement, no fasciculation or atrophy noted  MOTOR: Fairly symmetric bilateral upper and lower extremity bradykinesia, rigidity  REFLEXES: Reflexes are 3 and symmetric at the biceps, triceps, 3/3 knees, and absent at ankles. Plantar responses are extensor bilaterally  SENSORY: Mild decreased vibratory sensation  at toes.  COORDINATION: There is no trunk or limb dysmetria noted.  GAIT/STANCE: Retropulse tendency, unsteady, wide-based, reliant on her walker, multiple attempts to get up from seated position  ALLERGIES: Allergies  Allergen Reactions   Codeine     Pt states she has tolerated norco in the past   Fentanyl Other (See Comments)    Pt reports having hallucinations.   Morphine And Related     Patient's son states pt feels "looping" with morphine     HOME MEDICATIONS: Current Outpatient Medications  Medication Sig Dispense Refill   acetaminophen (TYLENOL) 500 MG tablet Take 1,000 mg by mouth every 8 (eight) hours as needed.     atenolol (TENORMIN) 50 MG tablet Take 1 tablet by mouth daily.     atorvastatin (LIPITOR) 20 MG tablet Take 20 mg by mouth daily.     atorvastatin (LIPITOR) 20 MG tablet Take 1 tablet by mouth daily.     citalopram (CELEXA) 10 MG tablet Take 10 mg by mouth daily.  mirtazapine (REMERON) 15 MG tablet Take 7.5 mg by mouth at bedtime.      Multiple Vitamin (MULTIVITAMIN WITH MINERALS) TABS tablet Take 1 tablet by mouth daily.     sertraline (ZOLOFT) 25 MG tablet Take 1 tablet by mouth daily.     No current facility-administered medications for this visit.    PAST MEDICAL HISTORY: Past Medical History:  Diagnosis Date   Anxiety and depression    Closed left femoral fracture ([place])    History  of vertebral fracture    from fall in September 2016   Hypertension    Insomnia    Macular degeneration    Memory loss    Solitary kidney     PAST SURGICAL HISTORY: Past Surgical History:  Procedure Laterality Date   ABDOMINAL HYSTERECTOMY     BLADDER SUSPENSION     BREAST SURGERY     CATARACT EXTRACTION, BILATERAL     EYE SURGERY     GALLBLADDER SURGERY     JOINT REPLACEMENT     NASAL SEPTUM SURGERY     RECONSTRUCTION OF EYELID     RECTOCELE REPAIR     TOTAL HIP ARTHROPLASTY Left 07/17/2016   Procedure: TOTAL HIP ARTHROPLASTY ANTERIOR APPROACH;  Surgeon: Leandrew Koyanagi, MD;  Location: [facility];  Service: Orthopedics;  Laterality: Left;    FAMILY HISTORY: Family History  Problem Relation Age of Onset   Heart disease Mother    Heart attack Father    Heart disease Brother    Heart disease Brother     SOCIAL HISTORY: Social History   Socioeconomic History   Marital status: Widowed    Spouse name: Not on file   Number of children: 2   Years of education: 61   Highest education level: Not on file  Occupational History   Occupation: retired  Tobacco Use   Smoking status: Never   Smokeless tobacco: Never  Vaping Use   Vaping Use: Never used  Substance and Sexual Activity   Alcohol use: Not Currently   Drug use: No   Sexual activity: Not on file  Other Topics Concern   Not on file  Social History Narrative   Lives at home alone.   Right-handed.   Caffeine use: one cup coffee.   Social Determinants of Health   Financial Resource Strain: Not on file  Food Insecurity: Not on file  Transportation Needs: Not on file  Physical Activity: Not on file  Stress: Not on file  Social Connections: Not on file  Intimate Partner Violence: Not on file    Total time spent reviewing the chart, obtaining history, examined patient, ordering tests, documentation, consultations and family, care coordination was  56 minutes    Marcial Pacas, M.D. Ph.D.  [facility] [address] [place], [place] [address] Ph: [phone] Fax: [phone]  CC:  Margretta Sidle, MD [place],  [place] [id number]  Margretta Sidle, MD

## 2022-10-21 ENCOUNTER — Encounter (INDEPENDENT_AMBULATORY_CARE_PROVIDER_SITE_OTHER): Payer: Medicare Other | Admitting: Ophthalmology

## 2023-01-05 ENCOUNTER — Telehealth: Payer: Self-pay | Admitting: Neurology

## 2023-01-05 NOTE — Telephone Encounter (Signed)
Called and spoke to patient's Daughter in [place], she stated the last 6-8 weeks she has noticed and increase in aggitation with her caregivers and has had a recent hx of UTIs. The last one was mid December and has since been tested and cleared of uti. She states there has been no changes in dosing and is currently taking 3 tabs daily, Patient sees PCP on Wednesday and is going to bring this up, but wanted to reach out here and see if anything needed to be changed.

## 2023-01-05 NOTE — Telephone Encounter (Signed)
Pt's daughter in law is calling with concerns , asking if the carbidopa-levodopa (SINEMET IR) 25-100 MG tablet , could possibly have anything to do with agitation, anger and most concerning pt being very argumentative.  Daughter in law is asking for a call to discuss.

## 2023-02-07 ENCOUNTER — Encounter (HOSPITAL_BASED_OUTPATIENT_CLINIC_OR_DEPARTMENT_OTHER): Payer: Self-pay

## 2023-02-07 ENCOUNTER — Other Ambulatory Visit: Payer: Self-pay

## 2023-02-07 ENCOUNTER — Emergency Department (HOSPITAL_BASED_OUTPATIENT_CLINIC_OR_DEPARTMENT_OTHER)
Admission: EM | Admit: 2023-02-07 | Discharge: 2023-02-07 | Disposition: A | Discharge status: Home / Self Care | Payer: Medicare HMO | Attending: Emergency Medicine | Admitting: Emergency Medicine

## 2023-02-07 DIAGNOSIS — N760 Acute vaginitis: Secondary | ICD-10-CM | POA: Insufficient documentation

## 2023-02-07 DIAGNOSIS — R300 Dysuria: Secondary | ICD-10-CM | POA: Diagnosis present

## 2023-02-07 LAB — WET PREP, GENITAL
Clue Cells Wet Prep HPF POC: NONE SEEN
Sperm: NONE SEEN
Trich, Wet Prep: NONE SEEN
WBC, Wet Prep HPF POC: <10 (ref <10)
Yeast Wet Prep HPF POC: NONE SEEN

## 2023-02-07 LAB — URINALYSIS, ROUTINE W REFLEX MICROSCOPIC
Bilirubin Urine: NEGATIVE
Glucose, UA: NEGATIVE mg/dL
Hgb urine dipstick: NEGATIVE
Ketones, ur: NEGATIVE mg/dL
Leukocytes,Ua: NEGATIVE
Nitrite: NEGATIVE
Protein, ur: NEGATIVE mg/dL
Specific Gravity, Urine: 1.009 (ref 1.005–1.030)
pH: 6.5 (ref 5.0–8.0)

## 2023-02-07 MED ORDER — GOLD BOND EX POWD: Freq: Two times a day (BID) | CUTANEOUS | 0 refills | Status: DC

## 2023-02-07 MED ORDER — CLOTRIMAZOLE-BETAMETHASONE 1-0.05 % EX CREA: 1.0000 | TOPICAL_CREAM | Freq: Two times a day (BID) | CUTANEOUS | 0 refills | Status: DC

## 2023-02-07 NOTE — ED Triage Notes (Signed)
Patient here POV from Home.  Visitor endorses UTI Symptoms such as Dysuria for approximately 1 Month and Lower Back Pain for 1 Week. Also notes Confusion Intermittently since Symptoms began.   No N/V/D. No Fevers.   NAD Noted during Triage. Confused. BIB Wheelchair.

## 2023-02-07 NOTE — ED Provider Notes (Signed)
[facility] Provider Note   CSN: [id number] Arrival date & time: 02/07/23  1121     History  Chief Complaint  Patient presents with   Back Pain    Emily Escobar is a 87 y.o. female.  HPI     87 year old female comes in with chief complaint of burning with urination and some back pain.  Patient is accompanied by her caregiver.  According the patient, she has been having burning with urination off and on for the last month.  She typically treats her burning with urination with Azo, and the symptoms do improve, however over the last 3 days the pain has been severe and not responding to Azo.  She also has experienced some low back pain.  Patient denies any urinary frequency.  She denies any nausea, vomiting, fevers, chills.  There is no history of kidney stone.  Back pain is mild to moderate.  She denies any flank pain.  Home Medications Prior to Admission medications   Medication Sig Start Date End Date Taking? Authorizing Provider  clotrimazole-betamethasone (LOTRISONE) cream Apply 1 Application topically 2 (two) times daily. 02/07/23  Yes Varney Biles, MD  menthol-zinc oxide (GOLD BOND) powder Apply topically 2 (two) times daily. 02/07/23  Yes Varney Biles, MD  acetaminophen (TYLENOL) 500 MG tablet Take 1,000 mg by mouth every 8 (eight) hours as needed.    [provider]  atenolol (TENORMIN) 50 MG tablet Take 1 tablet by mouth daily. 07/02/16   [provider]  atorvastatin (LIPITOR) 20 MG tablet Take 20 mg by mouth daily. 08/16/20   [provider]  atorvastatin (LIPITOR) 20 MG tablet Take 1 tablet by mouth daily.    [provider]  carbidopa-levodopa (SINEMET IR) 25-100 MG tablet Take 1 tablet by mouth 3 (three) times daily. Start with 1/2 tid for one week 09/23/22   Marcial Pacas, MD  citalopram (CELEXA) 10 MG tablet Take 10 mg by mouth daily. 09/15/22   [provider]  mirtazapine (REMERON) 15  MG tablet Take 7.5 mg by mouth at bedtime.  05/29/16   [provider]  Multiple Vitamin (MULTIVITAMIN WITH MINERALS) TABS tablet Take 1 tablet by mouth daily.    [provider]  sertraline (ZOLOFT) 25 MG tablet Take 1 tablet by mouth daily. 07/03/16   [provider]      Allergies    Codeine, Fentanyl, and Morphine and related    Review of Systems   Review of Systems  All other systems reviewed and are negative.   Physical Exam Updated Vital Signs BP (!) 188/92 (BP Location: Right Arm)   Pulse 60   Temp 97.6 F (36.4 C) (Oral)   Resp (!) 22   Ht '5\' 2"'$  (1.575 m)   Wt 72.1 kg   SpO2 96%   BMI 29.07 kg/m  Physical Exam Vitals and nursing note reviewed.  Constitutional:      Appearance: She is well-developed.  HENT:     Head: Normocephalic and atraumatic.  Eyes:     Extraocular Movements: Extraocular movements intact.  Cardiovascular:     Rate and Rhythm: Normal rate.  Pulmonary:     Effort: Pulmonary effort is normal.  Genitourinary:    Comments: Patient has significant erythema and inflammation in the vulvovaginal region. Musculoskeletal:     Cervical back: Normal range of motion and neck supple.  Skin:    General: Skin is dry.  Neurological:     Mental Status: She  is alert and oriented to person, place, and time.     ED Results / Procedures / Treatments   Labs (all labs ordered are listed, but only abnormal results are displayed) Labs Reviewed  WET PREP, GENITAL  URINALYSIS, ROUTINE W REFLEX MICROSCOPIC    EKG None  Radiology No results found.  Procedures Procedures    Medications Ordered in ED Medications - No data to display  ED Course/ Medical Decision Making/ A&P                             Medical Decision Making Amount and/or Complexity of Data Reviewed Labs: ordered.  Risk OTC drugs. Prescription drug management.  87 year old female comes in with chief complaint of burning with urination. She has  history of hypertension, anxiety, parkinsonism.  Patient is accompanied by her caregiver.  History is provided by both the patient and the caregiver.  Although patient is having burning with urination, back pain, her urine analysis is completely normal.  We therefore decided to proceed with gross pelvic exam.  Patient has clear irritation in the vulvovaginal region.  Most likely she is having burning with urination because of skin changes.  We proceeded with wet prep.  Patient's labs have been independently assessed.  UA is completely normal.  Wet prep is also reassuring.  Patient does not have any STI risk factors.  Plan is to put her on clotrimazole with hydrochlorothiazide and also give her zinc oxide cream.  Results of the ER workup discussed with the patient.  Return precautions have been discussed with the patient and the care giver.  Although she is having back pain, there is no focal spine tenderness and there is no red flags suggesting cauda equina.  We do not have any clinical suspicion for intra-abdominal infection at this time.  Labs, CT scan not indicated.  Final Clinical Impression(s) / ED Diagnoses Final diagnoses:  Vulvovaginitis    Rx / DC Orders ED Discharge Orders          Ordered    clotrimazole-betamethasone (LOTRISONE) cream  2 times daily        02/07/23 1306    menthol-zinc oxide (GOLD BOND) powder  2 times daily        02/07/23 1308              Varney Biles, MD 02/07/23 1315

## 2023-02-07 NOTE — Discharge Instructions (Addendum)
It appears to [place] that your vaginal pain, burning with urination is because of vaginitis and not because of UTI.  Your urine analysis shows no signs of infection.  Please apply the medication that is prescribed to the vaginal region.  Clotrimazole has antifungal and anti-inflammatory properties.  Zinc oxide has some antiseptic property as well.  Return to the emergency room if you start having worsening pain.

## 2023-03-15 ENCOUNTER — Encounter (HOSPITAL_BASED_OUTPATIENT_CLINIC_OR_DEPARTMENT_OTHER): Payer: Self-pay

## 2023-03-15 ENCOUNTER — Other Ambulatory Visit: Payer: Self-pay

## 2023-03-15 ENCOUNTER — Emergency Department (HOSPITAL_BASED_OUTPATIENT_CLINIC_OR_DEPARTMENT_OTHER)
Admission: EM | Admit: 2023-03-15 | Discharge: 2023-03-15 | Disposition: A | Discharge status: Home / Self Care | Payer: Medicare HMO | Attending: Emergency Medicine | Admitting: Emergency Medicine

## 2023-03-15 ENCOUNTER — Emergency Department (HOSPITAL_BASED_OUTPATIENT_CLINIC_OR_DEPARTMENT_OTHER): Payer: Medicare HMO

## 2023-03-15 DIAGNOSIS — S0990XA Unspecified injury of head, initial encounter: Secondary | ICD-10-CM | POA: Insufficient documentation

## 2023-03-15 DIAGNOSIS — I10 Essential (primary) hypertension: Secondary | ICD-10-CM | POA: Diagnosis not present

## 2023-03-15 DIAGNOSIS — W19XXXA Unspecified fall, initial encounter: Secondary | ICD-10-CM

## 2023-03-15 DIAGNOSIS — W01198A Fall on same level from slipping, tripping and stumbling with subsequent striking against other object, initial encounter: Secondary | ICD-10-CM | POA: Insufficient documentation

## 2023-03-15 DIAGNOSIS — F0390 Unspecified dementia without behavioral disturbance: Secondary | ICD-10-CM | POA: Insufficient documentation

## 2023-03-15 LAB — BASIC METABOLIC PANEL
Anion gap: 8 (ref 5–15)
BUN: 26 mg/dL — ABNORMAL HIGH (ref 8–23)
CO2: 27 mmol/L (ref 22–32)
Calcium: 9.3 mg/dL (ref 8.9–10.3)
Chloride: 107 mmol/L (ref 98–111)
Creatinine, Ser: 0.7 mg/dL (ref 0.44–1.00)
GFR, Estimated: >60 mL/min (ref >60)
Glucose, Bld: 94 mg/dL (ref 70–99)
Potassium: 4.4 mmol/L (ref 3.5–5.1)
Sodium: 142 mmol/L (ref 135–145)

## 2023-03-15 LAB — CBC WITH DIFFERENTIAL/PLATELET
Abs Immature Granulocytes: 0.01 10*3/uL (ref 0.00–0.07)
Basophils Absolute: 0 10*3/uL (ref 0.0–0.1)
Basophils Relative: 0 %
Eosinophils Absolute: 0.1 10*3/uL (ref 0.0–0.5)
Eosinophils Relative: 1 %
HCT: 43.4 % (ref 36.0–46.0)
Hemoglobin: 14.2 g/dL (ref 12.0–15.0)
Immature Granulocytes: 0 %
Lymphocytes Relative: 15 %
Lymphs Abs: 1.2 10*3/uL (ref 0.7–4.0)
MCH: 29.8 pg (ref 26.0–34.0)
MCHC: 32.7 g/dL (ref 30.0–36.0)
MCV: 91 fL (ref 80.0–100.0)
Monocytes Absolute: 0.6 10*3/uL (ref 0.1–1.0)
Monocytes Relative: 8 %
Neutro Abs: 6 10*3/uL (ref 1.7–7.7)
Neutrophils Relative %: 76 %
Platelets: 120 10*3/uL — ABNORMAL LOW (ref 150–400)
RBC: 4.77 MIL/uL (ref 3.87–5.11)
RDW: 13.9 % (ref 11.5–15.5)
WBC: 7.9 10*3/uL (ref 4.0–10.5)
nRBC: 0 % (ref 0.0–0.2)

## 2023-03-15 NOTE — ED Triage Notes (Signed)
Patient BIB [facility] from [facility]  Endorses Patient fell backwards after slipping in the shower. Witnessed by CNA which stated to [facility] that she sustained a Posterior Head injury against the [place].   History of HTN and Dementia. VSS with EMS except for BP of 180/90.   NAD Noted during Triage. A&Ox4. GCS 15. BIB St[facility].

## 2023-03-15 NOTE — ED Provider Notes (Signed)
[facility] Provider Note   CSN: [place]:[id number] Arrival date & time: 03/15/23  1057     History  Chief Complaint  Patient presents with   Lytle Michaels    Tori Hosick is a 87 y.o. female.  Patient here after witnessed fall in the shower by her CNA.  Patient with history of high cholesterol, hypertension, suspected dementia with parkinsonism type symptoms.  She is having a little bit of pain in the back of her head but no other extremity pain.  She uses a walker at baseline.  Sounds like she was in the shower and slipped and hit the back of her head against the shower wall.  She is not on any blood thinners.  Denies any chest pain or shortness of breath or abdominal pain nausea vomiting.  The history is provided by the patient.       Home Medications Prior to Admission medications   Medication Sig Start Date End Date Taking? Authorizing Provider  acetaminophen (TYLENOL) 500 MG tablet Take 1,000 mg by mouth every 8 (eight) hours as needed.    [provider]  atenolol (TENORMIN) 50 MG tablet Take 1 tablet by mouth daily. 07/02/16   [provider]  atorvastatin (LIPITOR) 20 MG tablet Take 20 mg by mouth daily. 08/16/20   [provider]  atorvastatin (LIPITOR) 20 MG tablet Take 1 tablet by mouth daily.    [provider]  carbidopa-levodopa (SINEMET IR) 25-100 MG tablet Take 1 tablet by mouth 3 (three) times daily. Start with 1/2 tid for one week 09/23/22   Marcial Pacas, MD  citalopram (CELEXA) 10 MG tablet Take 10 mg by mouth daily. 09/15/22   [provider]  clotrimazole-betamethasone (LOTRISONE) cream Apply 1 Application topically 2 (two) times daily. 02/07/23   Varney Biles, MD  menthol-zinc oxide (GOLD BOND) powder Apply topically 2 (two) times daily. 02/07/23   Varney Biles, MD  mirtazapine (REMERON) 15 MG tablet Take 7.5 mg by mouth at bedtime.  05/29/16   [provider]  Multiple Vitamin  (MULTIVITAMIN WITH MINERALS) TABS tablet Take 1 tablet by mouth daily.    [provider]  sertraline (ZOLOFT) 25 MG tablet Take 1 tablet by mouth daily. 07/03/16   [provider]      Allergies    Codeine, Fentanyl, and Morphine and related    Review of Systems   Review of Systems  Physical Exam Updated Vital Signs BP (!) 192/79 (BP Location: Right Arm)   Pulse (!) 58   Temp 98.3 F (36.8 C) (Oral)   Resp 16   Ht 5\' 2"  (1.575 m)   Wt 72.1 kg   SpO2 97%   BMI 29.07 kg/m  Physical Exam Vitals and nursing note reviewed.  Constitutional:      General: She is not in acute distress.    Appearance: She is well-developed. She is not ill-appearing.  HENT:     Head: Normocephalic and atraumatic.     Nose: Nose normal.     Mouth/Throat:     Mouth: Mucous membranes are moist.  Eyes:     Extraocular Movements: Extraocular movements intact.     Conjunctiva/sclera: Conjunctivae normal.     Pupils: Pupils are equal, round, and reactive to light.  Cardiovascular:     Rate and Rhythm: Normal rate and regular rhythm.     Pulses: Normal pulses.     Heart sounds: Normal heart sounds. No murmur heard. Pulmonary:  Effort: Pulmonary effort is normal. No respiratory distress.     Breath sounds: Normal breath sounds.  Abdominal:     Palpations: Abdomen is soft.     Tenderness: There is no abdominal tenderness.  Musculoskeletal:        General: No swelling.     Cervical back: Normal range of motion and neck supple. No tenderness.  Skin:    General: Skin is warm and dry.     Capillary Refill: Capillary refill takes less than 2 seconds.  Neurological:     General: No focal deficit present.     Mental Status: She is alert and oriented to person, place, and time.     Cranial Nerves: No cranial nerve deficit.     Sensory: No sensory deficit.     Motor: No weakness.     Coordination: Coordination normal.  Psychiatric:        Mood and Affect: Mood normal.     ED  Results / Procedures / Treatments   Labs (all labs ordered are listed, but only abnormal results are displayed) Labs Reviewed  CBC WITH DIFFERENTIAL/PLATELET - Abnormal; Notable for the following components:      Result Value   Platelets 120 (*)    All other components within normal limits  BASIC METABOLIC PANEL - Abnormal; Notable for the following components:   BUN 26 (*)    All other components within normal limits    EKG EKG Interpretation  Date/Time:  Sunday March 15 2023 11:35:51 EDT Ventricular Rate:  61 PR Interval:  216 QRS Duration: 94 QT Interval:  414 QTC Calculation: 417 R Axis:   -3 Text Interpretation: Sinus rhythm Confirmed by Lennice Sites ([id number]) on 03/15/2023 11:45:00 AM  Radiology DG Chest Portable 1 View  Result Date: 03/15/2023 CLINICAL DATA:  Fall EXAM: PORTABLE CHEST 1 VIEW COMPARISON:  07/17/2016 FINDINGS: Right shoulder arthroplasty. Midline trachea. Mild cardiomegaly. No pleural effusion or pneumothorax. Low lung volumes, accentuating mildly coarsened interstitium. No overt congestive failure or lobar consolidation. IMPRESSION: No acute or posttraumatic deformity identified. Cardiomegaly without congestive failure. Electronically Signed   By: Abigail Miyamoto M.D.   On: 03/15/2023 11:52   DG Pelvis Portable  Result Date: 03/15/2023 CLINICAL DATA:  Status post fall. EXAM: PORTABLE PELVIS 1-2 VIEWS COMPARISON:  09/04/2022 FINDINGS: Previous left hip arthroplasty. No signs of acute fracture or dislocation. No evidence for pelvic diastasis. Lumbar curvature degenerative disc disease noted. IMPRESSION: 1. No acute findings. 2. Status post left hip arthroplasty. Electronically Signed   By: Kerby Moors M.D.   On: 03/15/2023 11:51   CT Head Wo Contrast  Result Date: 03/15/2023 CLINICAL DATA:  Trauma EXAM: CT HEAD WITHOUT CONTRAST CT CERVICAL SPINE WITHOUT CONTRAST TECHNIQUE: Multidetector CT imaging of the head and cervical spine was performed following the  standard protocol without intravenous contrast. Multiplanar CT image reconstructions of the cervical spine were also generated. RADIATION DOSE REDUCTION: This exam was performed according to the departmental dose-optimization program which includes automated exposure control, adjustment of the mA and/or kV according to patient size and/or use of iterative reconstruction technique. COMPARISON:  CT Head 11/12/21, CT C Spine 08/20/22 FINDINGS: CT HEAD FINDINGS Brain: No evidence of acute infarction, hemorrhage, hydrocephalus, extra-axial collection or mass lesion/mass effect. Vascular: No hyperdense vessel or unexpected calcification. Skull: Mild soft tissue swelling over the right parietal scalp. No evidence of underlying calvarial fracture. Sinuses/Orbits: No middle ear or mastoid effusion. Bilateral lens replacement. Orbits are otherwise unremarkable. There is mild pansinus mucosal  thickening. There are likely postsurgical changes from a prior right maxillary antrostomy. Other: None. CT CERVICAL SPINE FINDINGS Alignment: Straightening of the normal cervical lordosis. Skull base and vertebrae: No acute fracture. No primary bone lesion or focal pathologic process. Schmorl's node at the superior endplate of T2. Soft tissues and spinal canal: No prevertebral fluid or swelling. No visible canal hematoma. Disc levels:   no evidence of high-grade spinal canal stenosis. Upper chest: Negative. Other: None IMPRESSION: 1. No acute intracranial abnormality. 2. Mild soft tissue swelling over the right parietal scalp. No evidence of underlying calvarial fracture. 3. No acute cervical spine fracture or traumatic malalignment. Electronically Signed   By: Marin Roberts M.D.   On: 03/15/2023 11:39   CT Cervical Spine Wo Contrast  Result Date: 03/15/2023 CLINICAL DATA:  Trauma EXAM: CT HEAD WITHOUT CONTRAST CT CERVICAL SPINE WITHOUT CONTRAST TECHNIQUE: Multidetector CT imaging of the head and cervical spine was performed following  the standard protocol without intravenous contrast. Multiplanar CT image reconstructions of the cervical spine were also generated. RADIATION DOSE REDUCTION: This exam was performed according to the departmental dose-optimization program which includes automated exposure control, adjustment of the mA and/or kV according to patient size and/or use of iterative reconstruction technique. COMPARISON:  CT Head 11/12/21, CT C Spine 08/20/22 FINDINGS: CT HEAD FINDINGS Brain: No evidence of acute infarction, hemorrhage, hydrocephalus, extra-axial collection or mass lesion/mass effect. Vascular: No hyperdense vessel or unexpected calcification. Skull: Mild soft tissue swelling over the right parietal scalp. No evidence of underlying calvarial fracture. Sinuses/Orbits: No middle ear or mastoid effusion. Bilateral lens replacement. Orbits are otherwise unremarkable. There is mild pansinus mucosal thickening. There are likely postsurgical changes from a prior right maxillary antrostomy. Other: None. CT CERVICAL SPINE FINDINGS Alignment: Straightening of the normal cervical lordosis. Skull base and vertebrae: No acute fracture. No primary bone lesion or focal pathologic process. Schmorl's node at the superior endplate of T2. Soft tissues and spinal canal: No prevertebral fluid or swelling. No visible canal hematoma. Disc levels:   no evidence of high-grade spinal canal stenosis. Upper chest: Negative. Other: None IMPRESSION: 1. No acute intracranial abnormality. 2. Mild soft tissue swelling over the right parietal scalp. No evidence of underlying calvarial fracture. 3. No acute cervical spine fracture or traumatic malalignment. Electronically Signed   By: Marin Roberts M.D.   On: 03/15/2023 11:39    Procedures Procedures    Medications Ordered in ED Medications - No data to display  ED Course/ Medical Decision Making/ A&P                             Medical Decision Making Amount and/or Complexity of Data  Reviewed Labs: ordered. Radiology: ordered.   Ladavia Waymon is here after fall in the shower.  Sounds like she had a witnessed fall while in the shower today.  CNA was with her.  She fell backwards and hit her head against the shower wall.  Does not sound like she lost consciousness.  History of hypertension and currently being worked up for dementia, some sort of supranuclear palsy/parkinsonism.  Neurologically she appears well.  Will get CT of her head, neck, chest x-ray and pelvic x-ray and check basic labs.  She does have any obvious focal tenderness on exam.  No obvious head trauma on exam.  Per my review interpretation of labs no significant anemia or electrolyte abnormality or kidney injury.  Radiology report of head and neck CT  are unremarkable.  Chest x-ray and pelvic x-ray per my review and interpretation without any acute findings.  Patient discharged in good condition.  Overall mechanical fall.  This chart was dictated using voice recognition software.  Despite best efforts to proofread,  errors can occur which can change the documentation meaning.         Final Clinical Impression(s) / ED Diagnoses Final diagnoses:  Fall, initial encounter    Rx / DC Orders ED Discharge Orders     None         Lennice Sites, DO 03/15/23 1328

## 2023-04-14 ENCOUNTER — Ambulatory Visit (INDEPENDENT_AMBULATORY_CARE_PROVIDER_SITE_OTHER): Payer: Medicare HMO | Admitting: Neurology

## 2023-04-14 ENCOUNTER — Encounter: Payer: Self-pay | Admitting: Neurology

## 2023-04-14 VITALS — BP 155/71 | HR 63 | Ht 63.0 in | Wt 159.0 lb

## 2023-04-14 DIAGNOSIS — R269 Unspecified abnormalities of gait and mobility: Secondary | ICD-10-CM | POA: Diagnosis not present

## 2023-04-14 DIAGNOSIS — R4189 Other symptoms and signs involving cognitive functions and awareness: Secondary | ICD-10-CM

## 2023-04-14 NOTE — Progress Notes (Signed)
ASSESSMENT AND PLAN  Emily Escobar is a 87 y.o. female   1.  Cognitive impairment 2.  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 Terrace Arabia     DIAGNOSTIC DATA (LABS, IMAGING, TESTING) - I reviewed patient records, labs, notes, testing and imaging myself where available.  CT head without contrast in Dec 2021.  No acute abnormality, mild supratentorium small vessel disease  Laboratory evaluation in May 2021: Normal CMP, CBC, hemoglobin of 13.8, creatinine 0.9, negative troponin, negative D-dimer, A1c of 5.1, normal TSH  More extensive laboratory evaluation on April 02, 2021: Normal CMP, creatinine 0.87, vitamin D 32.5, normal CBC, hemoglobin of 14.9, protein electrophoresis, copper, ANA, ferritin, A1c 5.6, normal ESR, CPK, TSH, C-reactive protein, folic acid, vitamin B12, RPR,  MRI of brain from [facility] in May 2022: No acute abnormality, moderate generalized atrophy, mild supratentorium small vessel disease  MRI of cervical spine showed multilevel degenerative changes, no significant canal stenosis variable degree of foraminal narrowing, most obvious at right C4-5  CT head March 2024 no acute intracranial abnormality.  Mild soft tissue swelling over right parietal scalp.  HISTORICAL  Emily Escobar, is a 87 year old female, accompanied by her daughter-in-law Dezaree Tracey, seen in request by her primary care physician Dr. Margot Ables for evaluation  of gait abnormality, frequent falling, memory loss, April 02, 2021  I reviewed and summarized the referring note. PMHX. HTN HLD Depression,  She was on polypharmacy, tapering off wellbutrin.  Patient used to live in [place], moved to independent living in September 2021 to be close to her son, she is a retired Interior and spatial designer, family history of dementia, mother suffered dementia  She was noted to have gradual onset of memory loss for about 10 years, gradually getting worse, misplace things, feels frustrated, no also complains of increased gait abnormality, begin to rely on her walker,  Today's MoCA examination was 17 out of 30,  CT head in December 2021 showed no acute abnormality, mild generalized atrophy supratentorium small vessel disease  Patient reported irregular sleep pattern, staying up late, sleeping late, often losing her breakfast poor appetite, not enough water intake, she has bilateral lower extremity paresthesia, slow worsening gait abnormality  Update May 01, 2021: She is accompanied by her daughter-in-law at today's visit, overall slow worsening gait abnormality, slurred speech, Electrodiagnostic study today showed no significant abnormality, no evidence of large fiber peripheral neuropathy, right cervical or lumbosacral radiculopathy MRI of the brain showed mild generalized atrophy supratentorium small vessel disease no acute abnormality  MRI of cervical spine showed multilevel degenerative changes, no evidence of canal stenosis, Laboratory evaluation showed no treatable etiology  She is most concerned about her slow worsening gait abnormality, tendency to fall backwards,  UPDATE Sep 23 2022: She is with her daughter in law, she has worsening memory loss, multiple falls, decreased functional status, fluctuation of her mentation, slow reaction time  She presented to emergency room on September 04, 2022 after fall, reviewed CT head without contrast, no acute abnormality,  generalized atrophy, nonspecific, upper brainstem significant atrophy, CT cervical spine showed multilevel degenerative changes, no significant  canal stenosis CT of maxillary sinus showed left sphenoid sinus air-fluid level with bubbly fluid, can be associated with acute sinusitis  She was noted to have more slurred speech, today's examination showed symmetric bilateral bradykinesia, rigidity, slow reaction time, significant upper abdominal vertical gaze difficulty,  Update April 14, 2023 SS: Here with DIL, Debbie. Living at [facility], right now at independent living, has 24/7 private care. She had tried the Sinemet, had increased agitation, anger. Tapered off it, mood is better than when on the medication. Didn't see any benefit with movement on Sinemet. Can easily fall, fall backwards. 03/15/23 fall getting out of the shower. Has noted more weakness, tendency to fall backwards. Hard to push herself up to stand. Has trouble with word findings. Speech is slow, monotone. In PT/OT 2 days a week. No trouble eating. Sleeps well. In the afternoon/evening can have agitation.   REVIEW OF SYSTEMS:  Full 14 system review of systems performed and notable only for as above All other review of systems were negative.  PHYSICAL EXAM:   Vitals:   04/14/23 1325  BP: (!) 155/71  Pulse: 63  Weight: 158 lb 15.2 oz (72.1 kg)  Height: 5\' 3"  (1.6 m)   Body mass index is 28.16 kg/m.     09/23/2022   10:47 AM 04/02/2021   10:26 AM  Montreal Cognitive Assessment   Visuospatial/ Executive (0/5) 1 4  Naming (0/3) 2 2  Attention: Read list of digits (0/2) 1 2  Attention: Read list of letters (0/1) 1 1  Attention: Serial 7 subtraction starting at 100 (0/3) 1 3  Language: Repeat phrase (0/2) 1 0  Language : Fluency (0/1) 0 0  Abstraction (0/2) 1 0  Delayed Recall (0/5) 1 1  Orientation (0/6) 2 4  Total 11 17  Adjusted Score (based on education)  17   Physical Exam  General: The patient is alert and  cooperative at the time of the examination.  Well-dressed, well-appearing elderly female.  Skin: No significant peripheral edema is noted.  Neurologic Exam  Mental status: The patient is alert and oriented x 3 at the time of the examination. The patient has apparent normal recent and remote memory, with an apparently normal attention span and concentration ability.  Cranial nerves: Facial symmetry is present.  Speech is slow, monotone, words are drawn out.  Significant vertical gaze palsy.  Motor: Strength is overall intact, slight decreased strength to the right arm and leg, symmetric extremity mild to moderate bradykinesia and rigidity  Sensory examination: Soft touch sensation is symmetric on the face, arms, and legs.  Coordination: Some dysmetria with finger-nose-finger bilaterally  Gait and station: Has to rock several times to stand, standing is very slow, her gait is surprisingly fairly steady with a walker, wide-based  Reflexes: Deep tendon reflexes are symmetric.   ALLERGIES: Allergies  Allergen Reactions   Codeine     Pt states she has tolerated norco in the past  Other Reaction(s): Not available   Fentanyl Other (See Comments)    Pt reports having hallucinations.  Other Reaction(s): hallucinations, Not available  Hallucinations   Morphine     Other Reaction(s): Not available, Other (See Comments)  Patient's son states pt feels "looping" with morphine   Morphine And Related     Patient's son states pt feels "looping" with morphine     HOME MEDICATIONS: Current Outpatient Medications  Medication Sig Dispense Refill   acetaminophen (TYLENOL) 500 MG tablet Take 1,000 mg by mouth every 8 (eight)  hours as needed.     atenolol (TENORMIN) 50 MG tablet Take 1 tablet by mouth daily.     atorvastatin (LIPITOR) 20 MG tablet Take 20 mg by mouth daily.     citalopram (CELEXA) 10 MG tablet Take 10 mg by mouth daily.     clotrimazole-betamethasone (LOTRISONE) cream Apply  1 Application topically 2 (two) times daily. 15 g 0   menthol-zinc oxide (GOLD BOND) powder Apply topically 2 (two) times daily. 113 g 0   mirtazapine (REMERON) 15 MG tablet Take 7.5 mg by mouth at bedtime.      Multiple Vitamin (MULTIVITAMIN WITH MINERALS) TABS tablet Take 1 tablet by mouth daily.     Multiple Vitamins-Minerals (PRESERVISION AREDS 2 PO) Take 2 tablets by mouth daily.     No current facility-administered medications for this visit.    PAST MEDICAL HISTORY: Past Medical History:  Diagnosis Date   Anxiety and depression    Closed left femoral fracture (HCC)    History of vertebral fracture    from fall in September 2016   Hypertension    Insomnia    Macular degeneration    Memory loss    Solitary kidney     PAST SURGICAL HISTORY: Past Surgical History:  Procedure Laterality Date   ABDOMINAL HYSTERECTOMY     BLADDER SUSPENSION     BREAST SURGERY     CATARACT EXTRACTION, BILATERAL     EYE SURGERY     GALLBLADDER SURGERY     JOINT REPLACEMENT     NASAL SEPTUM SURGERY     RECONSTRUCTION OF EYELID     RECTOCELE REPAIR     TOTAL HIP ARTHROPLASTY Left 07/17/2016   Procedure: TOTAL HIP ARTHROPLASTY ANTERIOR APPROACH;  Surgeon: Tarry Kos, MD;  Location: [facility] OR;  Service: Orthopedics;  Laterality: Left;    FAMILY HISTORY: Family History  Problem Relation Age of Onset   Heart disease Mother    Heart attack Father    Heart disease Brother    Heart disease Brother     SOCIAL HISTORY: Social History   Socioeconomic History   Marital status: Widowed    Spouse name: Not on file   Number of children: 2   Years of education: 56   Highest education level: Not on file  Occupational History   Occupation: retired  Tobacco Use   Smoking status: Never   Smokeless tobacco: Never  Vaping Use   Vaping Use: Never used  Substance and Sexual Activity   Alcohol use: Not Currently   Drug use: No   Sexual activity: Not on file  Other Topics Concern   Not on file   Social History Narrative   Lives at home alone.   Right-handed.   Caffeine use: one cup coffee.   Social Determinants of Health   Financial Resource Strain: Not on file  Food Insecurity: Not on file  Transportation Needs: Not on file  Physical Activity: Not on file  Stress: Not on file  Social Connections: Not on file  Intimate Partner Violence: Not on file    Margie Ege, Edrick Oh, DNP  [facility] [address], [address] [place], [place] [address] [phone]

## 2023-04-14 NOTE — Patient Instructions (Signed)
Continue to practice safe ambulation practices Recommend involvement in physical therapy, group exercise May consider higher level care such as assisted living Follow-up in 6 months with Dr. Terrace Arabia

## 2023-05-28 ENCOUNTER — Observation Stay (HOSPITAL_COMMUNITY)
Admission: EM | Admit: 2023-05-28 | Discharge: 2023-06-02 | Disposition: A | Discharge status: Skilled Nursing Facility | Payer: Medicare HMO | Attending: Surgery | Admitting: Surgery

## 2023-05-28 ENCOUNTER — Other Ambulatory Visit: Payer: Self-pay

## 2023-05-28 ENCOUNTER — Emergency Department (HOSPITAL_COMMUNITY): Payer: Medicare HMO

## 2023-05-28 ENCOUNTER — Encounter (HOSPITAL_COMMUNITY): Payer: Self-pay

## 2023-05-28 DIAGNOSIS — W01198A Fall on same level from slipping, tripping and stumbling with subsequent striking against other object, initial encounter: Secondary | ICD-10-CM | POA: Diagnosis not present

## 2023-05-28 DIAGNOSIS — G20A1 Parkinson's disease without dyskinesia, without mention of fluctuations: Secondary | ICD-10-CM | POA: Insufficient documentation

## 2023-05-28 DIAGNOSIS — S2241XB Multiple fractures of ribs, right side, initial encounter for open fracture: Secondary | ICD-10-CM

## 2023-05-28 DIAGNOSIS — S2241XA Multiple fractures of ribs, right side, initial encounter for closed fracture: Secondary | ICD-10-CM | POA: Diagnosis not present

## 2023-05-28 DIAGNOSIS — I10 Essential (primary) hypertension: Secondary | ICD-10-CM | POA: Insufficient documentation

## 2023-05-28 DIAGNOSIS — Z23 Encounter for immunization: Secondary | ICD-10-CM | POA: Diagnosis not present

## 2023-05-28 DIAGNOSIS — W19XXXA Unspecified fall, initial encounter: Secondary | ICD-10-CM

## 2023-05-28 DIAGNOSIS — Z96642 Presence of left artificial hip joint: Secondary | ICD-10-CM | POA: Insufficient documentation

## 2023-05-28 DIAGNOSIS — Z7952 Long term (current) use of systemic steroids: Secondary | ICD-10-CM | POA: Insufficient documentation

## 2023-05-28 DIAGNOSIS — S2249XA Multiple fractures of ribs, unspecified side, initial encounter for closed fracture: Secondary | ICD-10-CM | POA: Diagnosis present

## 2023-05-28 DIAGNOSIS — R0781 Pleurodynia: Secondary | ICD-10-CM | POA: Diagnosis present

## 2023-05-28 DIAGNOSIS — Z79899 Other long term (current) drug therapy: Secondary | ICD-10-CM | POA: Insufficient documentation

## 2023-05-28 LAB — BASIC METABOLIC PANEL
Anion gap: 8 (ref 5–15)
BUN: 18 mg/dL (ref 8–23)
CO2: 25 mmol/L (ref 22–32)
Calcium: 9.1 mg/dL (ref 8.9–10.3)
Chloride: 106 mmol/L (ref 98–111)
Creatinine, Ser: 0.84 mg/dL (ref 0.44–1.00)
GFR, Estimated: >60 mL/min (ref >60)
Glucose, Bld: 107 mg/dL — ABNORMAL HIGH (ref 70–99)
Potassium: 4 mmol/L (ref 3.5–5.1)
Sodium: 139 mmol/L (ref 135–145)

## 2023-05-28 LAB — CBC WITH DIFFERENTIAL/PLATELET
Abs Immature Granulocytes: 0.05 10*3/uL (ref 0.00–0.07)
Basophils Absolute: 0 10*3/uL (ref 0.0–0.1)
Basophils Relative: 0 %
Eosinophils Absolute: 0 10*3/uL (ref 0.0–0.5)
Eosinophils Relative: 0 %
HCT: 44.6 % (ref 36.0–46.0)
Hemoglobin: 14.8 g/dL (ref 12.0–15.0)
Immature Granulocytes: 1 %
Lymphocytes Relative: 8 %
Lymphs Abs: 0.7 10*3/uL (ref 0.7–4.0)
MCH: 30 pg (ref 26.0–34.0)
MCHC: 33.2 g/dL (ref 30.0–36.0)
MCV: 90.5 fL (ref 80.0–100.0)
Monocytes Absolute: 0.6 10*3/uL (ref 0.1–1.0)
Monocytes Relative: 7 %
Neutro Abs: 8.1 10*3/uL — ABNORMAL HIGH (ref 1.7–7.7)
Neutrophils Relative %: 84 %
Platelets: 144 10*3/uL — ABNORMAL LOW (ref 150–400)
RBC: 4.93 MIL/uL (ref 3.87–5.11)
RDW: 13.3 % (ref 11.5–15.5)
WBC: 9.6 10*3/uL (ref 4.0–10.5)
nRBC: 0 % (ref 0.0–0.2)

## 2023-05-28 MED ORDER — METHOCARBAMOL 500 MG PO TABS
500.0000 mg | ORAL_TABLET | Freq: Three times a day (TID) | ORAL | Status: AC
  Administered 2023-05-28 – 2023-05-31 (×9): 500 mg via ORAL
  Filled 2023-05-28 (×9): qty 1

## 2023-05-28 MED ORDER — ONDANSETRON 4 MG PO TBDP: 4.0000 mg | ORAL_TABLET | Freq: Four times a day (QID) | ORAL | Status: DC | PRN

## 2023-05-28 MED ORDER — POLYETHYLENE GLYCOL 3350 17 G PO PACK: 17.0000 g | PACK | Freq: Every day | ORAL | Status: DC | PRN

## 2023-05-28 MED ORDER — ENOXAPARIN SODIUM 30 MG/0.3ML IJ SOSY
30.0000 mg | PREFILLED_SYRINGE | Freq: Two times a day (BID) | INTRAMUSCULAR | Status: DC
  Administered 2023-05-29 – 2023-06-02 (×9): 30 mg via SUBCUTANEOUS
  Filled 2023-05-28 (×9): qty 0.3

## 2023-05-28 MED ORDER — METHOCARBAMOL 1000 MG/10ML IJ SOLN
500.0000 mg | Freq: Three times a day (TID) | INTRAVENOUS | Status: AC
  Filled 2023-05-28: qty 5

## 2023-05-28 MED ORDER — METOPROLOL TARTRATE 5 MG/5ML IV SOLN: 5.0000 mg | Freq: Four times a day (QID) | INTRAVENOUS | Status: DC | PRN

## 2023-05-28 MED ORDER — TETANUS-DIPHTH-ACELL PERTUSSIS 5-2.5-18.5 LF-MCG/0.5 IM SUSY
0.5000 mL | PREFILLED_SYRINGE | Freq: Once | INTRAMUSCULAR | Status: AC
  Administered 2023-05-28: 0.5 mL via INTRAMUSCULAR
  Filled 2023-05-28: qty 0.5

## 2023-05-28 MED ORDER — DOCUSATE SODIUM 100 MG PO CAPS
100.0000 mg | ORAL_CAPSULE | Freq: Two times a day (BID) | ORAL | Status: DC
  Administered 2023-05-28 – 2023-06-02 (×11): 100 mg via ORAL
  Filled 2023-05-28 (×11): qty 1

## 2023-05-28 MED ORDER — ACETAMINOPHEN 500 MG PO TABS
1000.0000 mg | ORAL_TABLET | Freq: Four times a day (QID) | ORAL | Status: DC
  Administered 2023-05-28 – 2023-06-02 (×17): 1000 mg via ORAL
  Filled 2023-05-28 (×18): qty 2

## 2023-05-28 MED ORDER — HYDRALAZINE HCL 20 MG/ML IJ SOLN
10.0000 mg | INTRAMUSCULAR | Status: DC | PRN
  Administered 2023-05-30: 10 mg via INTRAVENOUS
  Filled 2023-05-28 (×2): qty 1

## 2023-05-28 MED ORDER — ONDANSETRON HCL 4 MG/2ML IJ SOLN: 4.0000 mg | Freq: Four times a day (QID) | INTRAMUSCULAR | Status: DC | PRN

## 2023-05-28 MED ORDER — OXYCODONE HCL 5 MG PO TABS
2.5000 mg | ORAL_TABLET | ORAL | Status: DC | PRN
  Administered 2023-05-29 – 2023-06-02 (×4): 5 mg via ORAL
  Filled 2023-05-28 (×4): qty 1

## 2023-05-28 MED ORDER — KETOROLAC TROMETHAMINE 15 MG/ML IJ SOLN
15.0000 mg | Freq: Once | INTRAMUSCULAR | Status: AC
  Administered 2023-05-28: 15 mg via INTRAVENOUS
  Filled 2023-05-28: qty 1

## 2023-05-28 MED ORDER — LIDOCAINE 5 % EX PTCH
1.0000 | MEDICATED_PATCH | CUTANEOUS | Status: DC
  Administered 2023-05-28 – 2023-06-02 (×6): 1 via TRANSDERMAL
  Filled 2023-05-28 (×6): qty 1

## 2023-05-28 MED ORDER — HYDROCODONE-ACETAMINOPHEN 5-325 MG PO TABS
1.0000 | ORAL_TABLET | Freq: Once | ORAL | Status: AC
  Administered 2023-05-28: 1 via ORAL
  Filled 2023-05-28: qty 1

## 2023-05-28 NOTE — ED Notes (Signed)
Pt put on bedpan and unable to void after 8 mins of trying and requested bedpan to be removed. NAD noted at this time.

## 2023-05-28 NOTE — ED Notes (Signed)
Patient transported to CT 

## 2023-05-28 NOTE — ED Notes (Signed)
ED TO INPATIENT HANDOFF REPORT  ED Nurse Name and Phone #: Nicholos Johns [phone]  S Name/Age/Gender Emily Escobar 87 y.o. female Room/Bed: 023C/023C  Code Status   Code Status: Full Code  Home/SNF/Other Nursing Home Patient oriented to: self and place Is this baseline? Yes   Triage Complete: Triage complete  Chief Complaint Rib fractures [S22.49XA]  Triage Note Unwitnessed fall at facility, struck her head, no LOC. C/o R sided pain, ribs especially. C/o sob on the way to the hospital. 100% O2 sats on RA   Allergies Allergies  Allergen Reactions   Codeine Other (See Comments)    Pt states she has tolerated norco in the past Allergy not listed on MAR    Fentanyl Other (See Comments)    Hallucinations   Morphine Other (See Comments)    Patient's son states pt feels "looping" with morphine Allergy not listed on MAR    Morphine And Codeine Other (See Comments)    Patient's son states pt feels "looping" with morphine Allergy not listed on MAR     Level of Care/Admitting Diagnosis ED Disposition     ED Disposition  Admit   Condition  --   Comment  Hospital Area: [facility] [[id number]]  Level of Care: Med-Surg [16]  May place patient in observation at [facility] or [facility] if equivalent level of care is available:: No  Covid Evaluation: Asymptomatic - no recent exposure (last 10 days) testing not required  Diagnosis: Rib fractures [[id number]]  Admitting Physician: TRAUMA MD [[id number]]  Attending Physician: TRAUMA MD [[id number]]  Bed request comments: 6N  For patients discharging to extended facilities (i.e. SNF, AL, group homes or LTAC) initiate:: Discharge to SNF/Facility Placement COVID-19 Lab Testing Protocol          B Medical/Surgery History Past Medical History:  Diagnosis Date   Anxiety and depression    Closed left femoral fracture (HCC)    History of vertebral fracture    from fall in September 2016   Hypertension    Insomnia    Macular  degeneration    Memory loss    Solitary kidney    Past Surgical History:  Procedure Laterality Date   ABDOMINAL HYSTERECTOMY     BLADDER SUSPENSION     BREAST SURGERY     CATARACT EXTRACTION, BILATERAL     EYE SURGERY     GALLBLADDER SURGERY     JOINT REPLACEMENT     NASAL SEPTUM SURGERY     RECONSTRUCTION OF EYELID     RECTOCELE REPAIR     TOTAL HIP ARTHROPLASTY Left 07/17/2016   Procedure: TOTAL HIP ARTHROPLASTY ANTERIOR APPROACH;  Surgeon: Tarry Kos, MD;  Location: [facility] OR;  Service: Orthopedics;  Laterality: Left;     A IV Location/Drains/Wounds Patient Lines/Drains/Airways Status     Active Line/Drains/Airways     Name Placement date Placement time Site Days   Peripheral IV 05/28/23 22 G Anterior;Left Forearm 05/28/23  1315  Forearm  less than 1            Intake/Output Last 24 hours No intake or output data in the 24 hours ending 05/28/23 1346  Labs/Imaging Results for orders placed or performed during the hospital encounter of 05/28/23 (from the past 48 hour(s))  CBC with Differential     Status: Abnormal   Collection Time: 05/28/23 10:38 AM  Result Value Ref Range   WBC 9.6 4.0 - 10.5 K/uL   RBC 4.93 3.87 - 5.11 MIL/uL  Hemoglobin 14.8 12.0 - 15.0 g/dL   HCT 82.9 56.2 - 13.0 %   MCV 90.5 80.0 - 100.0 fL   MCH 30.0 26.0 - 34.0 pg   MCHC 33.2 30.0 - 36.0 g/dL   RDW 86.5 78.4 - 69.6 %   Platelets 144 (L) 150 - 400 K/uL   nRBC 0.0 0.0 - 0.2 %   Neutrophils Relative % 84 %   Neutro Abs 8.1 (H) 1.7 - 7.7 K/uL   Lymphocytes Relative 8 %   Lymphs Abs 0.7 0.7 - 4.0 K/uL   Monocytes Relative 7 %   Monocytes Absolute 0.6 0.1 - 1.0 K/uL   Eosinophils Relative 0 %   Eosinophils Absolute 0.0 0.0 - 0.5 K/uL   Basophils Relative 0 %   Basophils Absolute 0.0 0.0 - 0.1 K/uL   Immature Granulocytes 1 %   Abs Immature Granulocytes 0.05 0.00 - 0.07 K/uL    Comment: Performed at [facility], [address], [place], [place] [address]  Basic metabolic panel      Status: Abnormal   Collection Time: 05/28/23 10:38 AM  Result Value Ref Range   Sodium 139 135 - 145 mmol/L   Potassium 4.0 3.5 - 5.1 mmol/L   Chloride 106 98 - 111 mmol/L   CO2 25 22 - 32 mmol/L   Glucose, Bld 107 (H) 70 - 99 mg/dL    Comment: Glucose reference range applies only to samples taken after fasting for at least 8 hours.   BUN 18 8 - 23 mg/dL   Creatinine, Ser 4.13 0.44 - 1.00 mg/dL   Calcium 9.1 8.9 - 24.4 mg/dL   GFR, Estimated >01 >02 mL/min    Comment: (NOTE) Calculated using the CKD-EPI Creatinine Equation (2021)    Anion gap 8 5 - 15    Comment: Performed at [facility], [address] [place], [place] [address]   DG Ribs Unilateral W/Chest Right  Result Date: 05/28/2023 CLINICAL DATA:  Rib pain and shortness of breath. EXAM: RIGHT RIBS AND CHEST - 3+ VIEW COMPARISON:  Chest radiograph 03/15/2023. FINDINGS: Three views of the chest and right ribs. Acute, mildly displaced fractures of the right lateral seventh and eighth ribs. Acute, nondisplaced fracture of the right lateral ninth rib. No pneumothorax. Low lung volumes accentuate the pulmonary vasculature and cardiomediastinal silhouette. No consolidation or pulmonary edema. No pleural effusion. IMPRESSION: Acute, mildly displaced fractures of the right lateral seventh and eighth ribs. Acute, nondisplaced fracture of the right lateral ninth rib. No pneumothorax. Electronically Signed   By: Orvan Falconer M.D.   On: 05/28/2023 12:22   DG Hips Bilat W or Wo Pelvis 3-4 Views  Result Date: 05/28/2023 CLINICAL DATA:  Trauma, fall, pain EXAM: DG HIP (WITH OR WITHOUT PELVIS) 3-4V BILAT COMPARISON:  None Available. FINDINGS: No recent fracture or dislocation is seen. There is previous left hip arthroplasty. Vascular calcifications are seen in pelvis. IMPRESSION: No recent fracture or dislocation is seen in both hips. Previous left hip arthroplasty. Electronically Signed   By: Ernie Avena M.D.   On: 05/28/2023  12:21   CT Cervical Spine Wo Contrast  Result Date: 05/28/2023 CLINICAL DATA:  87 year old female status post unwitnessed fall striking head. Right side pain. Shortness of breath. EXAM: CT CERVICAL SPINE WITHOUT CONTRAST TECHNIQUE: Multidetector CT imaging of the cervical spine was performed without intravenous contrast. Multiplanar CT image reconstructions were also generated. RADIATION DOSE REDUCTION: This exam was performed according to the departmental dose-optimization program which  includes automated exposure control, adjustment of the mA and/or kV according to patient size and/or use of iterative reconstruction technique. COMPARISON:  Head CT today.  Cervical spine CT 03/15/2023. FINDINGS: Alignment: Chronic straightening of cervical lordosis. Cervicothoracic junction alignment is within normal limits. Bilateral posterior element alignment is within normal limits. Skull base and vertebrae: Bone mineralization is within normal limits. Visualized skull base is intact. No atlanto-occipital dissociation. C1 and C2 appear intact and aligned. No acute osseous abnormality identified. Soft tissues and spinal canal: No prevertebral fluid or swelling. No visible canal hematoma. Cervical carotid calcified atherosclerosis greater on the left. Disc levels: Evidence of developing C2-C3 and chronic C3-C4 ankylosis. Widespread chronic disc, endplate, and facet degeneration. But capacious spinal canal suspected at most levels. Possible mild chronic spinal stenosis at C4-C5. Upper chest: Visible upper thoracic levels appears stable. Negative lung apices. Streak artifact from right shoulder arthroplasty. IMPRESSION: 1. No acute traumatic injury identified in the cervical spine. 2. 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 Electronically Signed   By: Odessa Fleming M.D.   On: 05/28/2023 11:45   CT Head Wo Contrast  Result Date: 05/28/2023 CLINICAL DATA:  87 year old female status post  unwitnessed fall striking head. Right side pain. Shortness of breath. EXAM: CT HEAD WITHOUT CONTRAST TECHNIQUE: Contiguous axial images were obtained from the base of the skull through the vertex without intravenous contrast. RADIATION DOSE REDUCTION: This exam was performed according to the departmental dose-optimization program which includes automated exposure control, adjustment of the mA and/or kV according to patient size and/or use of iterative reconstruction technique. COMPARISON:  Head CT 03/15/2023. FINDINGS: Brain: Stable cerebral volume. No midline shift, ventriculomegaly, mass effect, evidence of mass lesion, intracranial hemorrhage or evidence of cortically based acute infarction. Stable gray-white matter differentiation throughout the brain. Mild for age scattered white matter hypodensity. Vascular: Calcified atherosclerosis at the skull base. No suspicious intracranial vascular hyperdensity. Skull: No fracture identified. Sinuses/Orbits: Previous paranasal sinus surgery. 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 Tympanic cavities and mastoids remain clear. Other: No orbit or scalp soft tissue injury identified. IMPRESSION: 1. No acute traumatic injury identified. 2. Mild for age chronic white matter disease. 3. Chronic paranasal sinus disease. Electronically Signed   By: Odessa Fleming M.D.   On: 05/28/2023 11:42    Pending Labs Unresulted Labs (From admission, onward)     Start     Ordered   06/04/23 0500  Creatinine, serum  (enoxaparin (LOVENOX)    CrCl >/= 30 with major trauma, spinal cord injury, or selected orthopedic surgery)  Weekly,   R     Comments: while on enoxaparin therapy.    05/28/23 1337   05/29/23 0500  CBC  Tomorrow morning,   R        05/28/23 1337   05/29/23 0500  Basic metabolic panel  Tomorrow morning,   R        05/28/23 1337   05/28/23 1038  Urinalysis, Routine w reflex microscopic -Urine, Clean Catch  Once,   URGENT       Question:   Specimen Source  Answer:  Urine, Clean Catch   05/28/23 1038            Vitals/Pain Today's Vitals   05/28/23 1145 05/28/23 1200 05/28/23 1215 05/28/23 1304  BP:      Pulse: 64 69 65   Resp: 16 19 16    Temp:      TempSrc:      SpO2: 98%  100% 100%   PainSc:    10-Worst pain ever    Isolation Precautions No active isolations  Medications Medications  lidocaine (LIDODERM) 5 % 1 patch (1 patch Transdermal Patch Applied 05/28/23 1316)  acetaminophen (TYLENOL) tablet 1,000 mg (has no administration in time range)  methocarbamol (ROBAXIN) tablet 500 mg (has no administration in time range)    Or  methocarbamol (ROBAXIN) 500 mg in dextrose 5 % 50 mL IVPB (has no administration in time range)  docusate sodium (COLACE) capsule 100 mg (has no administration in time range)  polyethylene glycol (MIRALAX / GLYCOLAX) packet 17 g (has no administration in time range)  ondansetron (ZOFRAN-ODT) disintegrating tablet 4 mg (has no administration in time range)    Or  ondansetron (ZOFRAN) injection 4 mg (has no administration in time range)  metoprolol tartrate (LOPRESSOR) injection 5 mg (has no administration in time range)  hydrALAZINE (APRESOLINE) injection 10 mg (has no administration in time range)  enoxaparin (LOVENOX) injection 30 mg (has no administration in time range)  oxyCODONE (Oxy IR/ROXICODONE) immediate release tablet 2.5-5 mg (has no administration in time range)  HYDROcodone-acetaminophen (NORCO/VICODIN) 5-325 MG per tablet 1 tablet (1 tablet Oral Given 05/28/23 1215)  Tdap (BOOSTRIX) injection 0.5 mL (0.5 mLs Intramuscular Given 05/28/23 1215)  ketorolac (TORADOL) 15 MG/ML injection 15 mg (15 mg Intravenous Given 05/28/23 1315)    Mobility walks with device     Focused Assessments Cardiac Assessment Handoff:    Lab Results  Component Value Date   CKTOTAL 60 04/02/2021   No results found for: "DDIMER" Does the Patient currently have chest pain? No     R Recommendations: See Admitting Provider Note  Report given to:   Additional Notes: .

## 2023-05-28 NOTE — ED Provider Notes (Signed)
[place] EMERGENCY DEPARTMENT AT [facility] Provider Note   CSN: [id number] Arrival date & time: 05/28/23  1016     History  Chief Complaint  Patient presents with   Fall        Shortness of Breath         Emily Escobar is a 87 y.o. female with a past medical history significant for hypertension, cognitive impairment, gait abnormality who presents to the ED after unwitnessed fall.  It is unclear why she fell. Difficult to obtain history from patient. Level 5 caveat.  She admits to hitting her head.  No LOC.  Not on any blood thinners.  Patient admits to right-sided rib pain associated with shortness of breath.  No shortness of breath prior to fall.  Denies cough.  No chest pain.  Patient also admits to bilateral hip pain.  No other injuries.  Speech at baseline per living facility.  Patient in her normal state of health prior to fall.  Typically ambulates with a walker.  History obtained from patient and past medical records. No interpreter used during encounter.       Home Medications Prior to Admission medications   Medication Sig Start Date End Date Taking? Authorizing Provider  acetaminophen (TYLENOL) 500 MG tablet Take 1,000 mg by mouth every 8 (eight) hours as needed.    [provider]  atenolol (TENORMIN) 50 MG tablet Take 1 tablet by mouth daily. 07/02/16   [provider]  atorvastatin (LIPITOR) 20 MG tablet Take 20 mg by mouth daily. 08/16/20   [provider]  citalopram (CELEXA) 10 MG tablet Take 10 mg by mouth daily. 09/15/22   [provider]  clotrimazole-betamethasone (LOTRISONE) cream Apply 1 Application topically 2 (two) times daily. 02/07/23   Derwood Kaplan, MD  menthol-zinc oxide (GOLD BOND) powder Apply topically 2 (two) times daily. 02/07/23   Derwood Kaplan, MD  mirtazapine (REMERON) 15 MG tablet Take 7.5 mg by mouth at bedtime.  05/29/16   [provider]  Multiple Vitamin (MULTIVITAMIN WITH MINERALS)  TABS tablet Take 1 tablet by mouth daily.    [provider]  Multiple Vitamins-Minerals (PRESERVISION AREDS 2 PO) Take 2 tablets by mouth daily.    [provider]      Allergies    Codeine, Fentanyl, Morphine, and Morphine and codeine    Review of Systems   Review of Systems  Respiratory:  Positive for shortness of breath.   Cardiovascular:  Positive for chest pain (rib pain).  Musculoskeletal:  Positive for arthralgias.    Physical Exam Updated Vital Signs BP (!) 165/80   Pulse 65   Temp 98.7 F (37.1 C) (Oral)   Resp 16   SpO2 100%  Physical Exam Vitals and nursing note reviewed.  Constitutional:      General: She is not in acute distress.    Appearance: She is not ill-appearing.  HENT:     Head: Normocephalic.  Eyes:     Pupils: Pupils are equal, round, and reactive to light.  Cardiovascular:     Rate and Rhythm: Normal rate and regular rhythm.     Pulses: Normal pulses.     Heart sounds: Normal heart sounds. No murmur heard.    No friction rub. No gallop.  Pulmonary:     Effort: Pulmonary effort is normal.     Breath sounds: Normal breath sounds.  Chest:     Comments: Right-sided rib pain without crepitus or deformity. Abdominal:  General: Abdomen is flat. There is no distension.     Palpations: Abdomen is soft.     Tenderness: There is no abdominal tenderness. There is no guarding or rebound.  Musculoskeletal:        General: Normal range of motion.     Cervical back: Neck supple.     Comments: Bony tenderness to bilateral hips with decreased range of motion.  Lower extremities with soft compartments.  Pedal pulses palpable bilaterally.  Skin:    General: Skin is warm and dry.  Neurological:     General: No focal deficit present.     Mental Status: She is alert.  Psychiatric:        Mood and Affect: Mood normal.        Behavior: Behavior normal.     ED Results / Procedures / Treatments   Labs (all labs ordered are listed, but  only abnormal results are displayed) Labs Reviewed  CBC WITH DIFFERENTIAL/PLATELET - Abnormal; Notable for the following components:      Result Value   Platelets 144 (*)    Neutro Abs 8.1 (*)    All other components within normal limits  BASIC METABOLIC PANEL - Abnormal; Notable for the following components:   Glucose, Bld 107 (*)    All other components within normal limits  URINALYSIS, ROUTINE W REFLEX MICROSCOPIC    EKG EKG Interpretation  Date/Time:  Thursday May 28 2023 10:19:27 EDT Ventricular Rate:  69 PR Interval:    QRS Duration: 83 QT Interval:  513 QTC Calculation: 550 R Axis:   -14 Text Interpretation: Normal sinus rhythm Inferior infarct, old  qt manually calculated normal No significant change since last tracing Confirmed by Melene Plan [phone]) on 05/28/2023 12:50:46 PM  Radiology DG Ribs Unilateral W/Chest Right  Result Date: 05/28/2023 CLINICAL DATA:  Rib pain and shortness of breath. EXAM: RIGHT RIBS AND CHEST - 3+ VIEW COMPARISON:  Chest radiograph 03/15/2023. FINDINGS: Three views of the chest and right ribs. Acute, mildly displaced fractures of the right lateral seventh and eighth ribs. Acute, nondisplaced fracture of the right lateral ninth rib. No pneumothorax. Low lung volumes accentuate the pulmonary vasculature and cardiomediastinal silhouette. No consolidation or pulmonary edema. No pleural effusion. IMPRESSION: Acute, mildly displaced fractures of the right lateral seventh and eighth ribs. Acute, nondisplaced fracture of the right lateral ninth rib. No pneumothorax. Electronically Signed   By: Orvan Falconer M.D.   On: 05/28/2023 12:22   DG Hips Bilat W or Wo Pelvis 3-4 Views  Result Date: 05/28/2023 CLINICAL DATA:  Trauma, fall, pain EXAM: DG HIP (WITH OR WITHOUT PELVIS) 3-4V BILAT COMPARISON:  None Available. FINDINGS: No recent fracture or dislocation is seen. There is previous left hip arthroplasty. Vascular calcifications are seen in pelvis.  IMPRESSION: No recent fracture or dislocation is seen in both hips. Previous left hip arthroplasty. Electronically Signed   By: Ernie Avena M.D.   On: 05/28/2023 12:21   CT Cervical Spine Wo Contrast  Result Date: 05/28/2023 CLINICAL DATA:  87 year old female status post unwitnessed fall striking head. Right side pain. Shortness of breath. EXAM: CT CERVICAL SPINE WITHOUT CONTRAST TECHNIQUE: Multidetector CT imaging of the cervical spine was performed without intravenous contrast. Multiplanar CT image reconstructions were also generated. RADIATION DOSE REDUCTION: This exam was performed according to the departmental dose-optimization program which includes automated exposure control, adjustment of the mA and/or kV according to patient size and/or use of iterative reconstruction technique. COMPARISON:  Head CT today.  Cervical  spine CT 03/15/2023. FINDINGS: Alignment: Chronic straightening of cervical lordosis. Cervicothoracic junction alignment is within normal limits. Bilateral posterior element alignment is within normal limits. Skull base and vertebrae: Bone mineralization is within normal limits. Visualized skull base is intact. No atlanto-occipital dissociation. C1 and C2 appear intact and aligned. No acute osseous abnormality identified. Soft tissues and spinal canal: No prevertebral fluid or swelling. No visible canal hematoma. Cervical carotid calcified atherosclerosis greater on the left. Disc levels: Evidence of developing C2-C3 and chronic C3-C4 ankylosis. Widespread chronic disc, endplate, and facet degeneration. But capacious spinal canal suspected at most levels. Possible mild chronic spinal stenosis at C4-C5. Upper chest: Visible upper thoracic levels appears stable. Negative lung apices. Streak artifact from right shoulder arthroplasty. IMPRESSION: 1. No acute traumatic injury identified in the cervical spine. 2. Widespread chronic cervical spine degeneration superimposed on C3-C4  ankylosis. Electronically Signed   By: Odessa Fleming M.D.   On: 05/28/2023 11:45   CT Head Wo Contrast  Result Date: 05/28/2023 CLINICAL DATA:  87 year old female status post unwitnessed fall striking head. Right side pain. Shortness of breath. EXAM: CT HEAD WITHOUT CONTRAST TECHNIQUE: Contiguous axial images were obtained from the base of the skull through the vertex without intravenous contrast. RADIATION DOSE REDUCTION: This exam was performed according to the departmental dose-optimization program which includes automated exposure control, adjustment of the mA and/or kV according to patient size and/or use of iterative reconstruction technique. COMPARISON:  Head CT 03/15/2023. FINDINGS: Brain: Stable cerebral volume. No midline shift, ventriculomegaly, mass effect, evidence of mass lesion, intracranial hemorrhage or evidence of cortically based acute infarction. Stable gray-white matter differentiation throughout the brain. Mild for age scattered white matter hypodensity. Vascular: Calcified atherosclerosis at the skull base. No suspicious intracranial vascular hyperdensity. Skull: No fracture identified. Sinuses/Orbits: Previous paranasal sinus surgery. 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 Tympanic cavities and mastoids remain clear. Other: No orbit or scalp soft tissue injury identified. IMPRESSION: 1. No acute traumatic injury identified. 2. Mild for age chronic white matter disease. 3. Chronic paranasal sinus disease. Electronically Signed   By: Odessa Fleming M.D.   On: 05/28/2023 11:42    Procedures Procedures    Medications Ordered in ED Medications  ketorolac (TORADOL) 15 MG/ML injection 15 mg (has no administration in time range)  HYDROcodone-acetaminophen (NORCO/VICODIN) 5-325 MG per tablet 1 tablet (1 tablet Oral Given 05/28/23 1215)  Tdap (BOOSTRIX) injection 0.5 mL (0.5 mLs Intramuscular Given 05/28/23 1215)    ED Course/ Medical Decision Making/ A&P                              Medical Decision Making Amount and/or Complexity of Data Reviewed Independent Historian: EMS Labs: ordered. Decision-making details documented in ED Course. Radiology: ordered and independent interpretation performed. Decision-making details documented in ED Course.  Risk Prescription drug management. Decision regarding hospitalization.   This patient presents to the ED for concern of fall, this involves an extensive number of treatment options, and is a complaint that carries with it a high risk of complications and morbidity.  The differential diagnosis includes intracranial bleed, bony fracture, contusion, etc  87 year old female with history of gait instability presents to the ED after an unwitnessed fall.  It is unclear what caused patient to fall.  Difficult to obtain HPI from patient at bedside.  Patient admits to right-sided rib pain associated with shortness of breath.  Upon arrival, patient afebrile, not tachycardic or  hypoxic.  Patient appears uncomfortable during initial evaluation.  Right-sided tenderness to ribs without crepitus or deformity.  Lungs clear to auscultation bilaterally.  Difficulties raising both legs off the bed with bilateral bony tenderness to hips.  CT head and cervical spine ordered to rule out intracranial bleed or cervical fracture.  X-rays ordered to rule out rib fracture/hip fracture.  Patient sustained a superficial skin tear to right forearm. Tetanus updated. Routine labs ordered given it is unclear whether or not this was a mechanical fall or not. Likely mechanical with history of frequent falls. Follows neurology for cognitive impairment and gait abnormality. Hydrocodone given. Patient allergic to most IV pain medications.   CBC reassuring.  No leukocytosis.  Normal hemoglobin.  BMP reassuring.  Mild hyperglycemia 107.  Normal renal function.  Rib x-ray personally reviewed and interpreted which demonstrates rib fracture 7 through 9.  No  evidence of pneumothorax.  Pelvis x-ray negative for bony fractures.  CT head and cervical spine negative.  Upon reassessment patient admits to continued pain.  Patient allergic to most IV opioids medication. Will discuss with trauma for admission for pain management and observation. IV Toradol given.   12:55 PM Discussed with Dr. Janee Morn with trauma surgery who agrees to admit patient.   Discussed with Dr. Adela Lank who agrees with assessment and plan.   No PCP Hx HTN, gait abnormality        Final Clinical Impression(s) / ED Diagnoses Final diagnoses:  Fall, initial encounter  Open fracture of multiple ribs of right side, initial encounter    Rx / DC Orders ED Discharge Orders     None         Jesusita Oka 05/28/23 1258    Melene Plan, DO 05/28/23 1325

## 2023-05-28 NOTE — H&P (Signed)
Emily Escobar 1936/01/06  [id number].    Requesting MD: Claudette Stapler, PA-C; attending Dr. Melene Plan Chief Complaint/Reason for Consult: Fall  HPI: Emily Escobar is a 87 y.o. female with a hx of cognitive impairment, memory loss and gait abnormality with frequent falls   Patient was a level 5 caveat per EDP note with hx of unwitnessed fall with unclear circumstances. Patient did admit to head trauma, no loc.  She complained of pain over her R ribs. She underwent workup that showed stable hemodynamics without tachycardia, hypotension or hypoxia on RA. Hgb 14.8. No AKI. Glucose 107. Na, K, Cl, Calcium wnl. CTH and CT C-spine with no acute injuries identified. CXR w/ R 7-9th rib fx's without PTX. Patient still in pain despite pain medication and we were called for admission.   Patient is orientated to self only. She complains of R rib pain. No other complaints. She reports she remebers the fall but cannot tell me the circumstances. Doesn't think she hit her head but thinks she had LOC for an unknown period of time.   Patient follows with Margie Ege, NP of Neurology for his cognitive impairment and gait abnormality with frequent falls. 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   Tobacco Use: None Alcohol Use: None Substance use: None  Employment: Retired Patient lives at [employer].  Walks at baseline with a walker.   ROS: ROS As above, see hpi  Family History  Problem Relation Age of Onset   Heart disease Mother    Heart attack Father    Heart disease Brother    Heart disease Brother     Past Medical History:  Diagnosis Date   Anxiety and depression    Closed left femoral fracture (HCC)    History of vertebral fracture    from fall in September 2016   Hypertension    Insomnia    Macular degeneration    Memory loss    Solitary kidney      Past Surgical History:  Procedure Laterality Date   ABDOMINAL HYSTERECTOMY     BLADDER SUSPENSION     BREAST SURGERY     CATARACT EXTRACTION, BILATERAL     EYE SURGERY     GALLBLADDER SURGERY     JOINT REPLACEMENT     NASAL SEPTUM SURGERY     RECONSTRUCTION OF EYELID     RECTOCELE REPAIR     TOTAL HIP ARTHROPLASTY Left 07/17/2016   Procedure: TOTAL HIP ARTHROPLASTY ANTERIOR APPROACH;  Surgeon: Tarry Kos, MD;  Location: [facility] OR;  Service: Orthopedics;  Laterality: Left;    Social History:  reports that she has never smoked. She has never used smokeless tobacco. She reports that she does not currently use alcohol. She reports that she does not use drugs.  Allergies:  Allergies  Allergen Reactions   Codeine     Pt states she has tolerated norco in the past  Other Reaction(s): Not available   Fentanyl Other (See Comments)    Pt reports having hallucinations.  Other Reaction(s): hallucinations, Not available  Hallucinations   Morphine     Other Reaction(s): Not available, Other (See Comments)  Patient's son states pt feels "looping" with morphine   Morphine And Codeine     Patient's son states pt feels "looping" with morphine     (Not in a hospital admission)    Physical  Exam: Blood pressure (!) 165/80, pulse 65, temperature 98.7 F (37.1 C), temperature source Oral, resp. rate 16, SpO2 100 %. Gen:  Alert, NAD, pleasant HEENT: Normocephalic, atraumatic Neck: Cleared by EDP. No C-spine ttp or step offs Card:  RRR. Radial and DP pulse 2+ Pulm:  CTAB, no W/R/R, effort normal. On RA. R chest wall ttp Abd: Soft, ND, NT Psych: A&Ox1 Skin: Warm and dry.  Neuro: Non-focal. MAE's.  Msk: No ttp over major joints of the BUE and BLE's   Results for orders placed or performed during the hospital encounter of 05/28/23 (from the past 48 hour(s))  CBC with Differential     Status: Abnormal   Collection Time: 05/28/23 10:38 AM  Result Value Ref Range   WBC 9.6 4.0 - 10.5  K/uL   RBC 4.93 3.87 - 5.11 MIL/uL   Hemoglobin 14.8 12.0 - 15.0 g/dL   HCT 60.4 54.0 - 98.1 %   MCV 90.5 80.0 - 100.0 fL   MCH 30.0 26.0 - 34.0 pg   MCHC 33.2 30.0 - 36.0 g/dL   RDW 19.1 47.8 - 29.5 %   Platelets 144 (L) 150 - 400 K/uL   nRBC 0.0 0.0 - 0.2 %   Neutrophils Relative % 84 %   Neutro Abs 8.1 (H) 1.7 - 7.7 K/uL   Lymphocytes Relative 8 %   Lymphs Abs 0.7 0.7 - 4.0 K/uL   Monocytes Relative 7 %   Monocytes Absolute 0.6 0.1 - 1.0 K/uL   Eosinophils Relative 0 %   Eosinophils Absolute 0.0 0.0 - 0.5 K/uL   Basophils Relative 0 %   Basophils Absolute 0.0 0.0 - 0.1 K/uL   Immature Granulocytes 1 %   Abs Immature Granulocytes 0.05 0.00 - 0.07 K/uL    Comment: Performed at [facility], [address], [place], [place] [address]  Basic metabolic panel     Status: Abnormal   Collection Time: 05/28/23 10:38 AM  Result Value Ref Range   Sodium 139 135 - 145 mmol/L   Potassium 4.0 3.5 - 5.1 mmol/L   Chloride 106 98 - 111 mmol/L   CO2 25 22 - 32 mmol/L   Glucose, Bld 107 (H) 70 - 99 mg/dL    Comment: Glucose reference range applies only to samples taken after fasting for at least 8 hours.   BUN 18 8 - 23 mg/dL   Creatinine, Ser 8.65 0.44 - 1.00 mg/dL   Calcium 9.1 8.9 - 78.4 mg/dL   GFR, Estimated >69 >62 mL/min    Comment: (NOTE) Calculated using the CKD-EPI Creatinine Equation (2021)    Anion gap 8 5 - 15    Comment: Performed at [facility], [address], [place], [place] [address]   DG Ribs Unilateral W/Chest Right  Result Date: 05/28/2023 CLINICAL DATA:  Rib pain and shortness of breath. EXAM: RIGHT RIBS AND CHEST - 3+ VIEW COMPARISON:  Chest radiograph 03/15/2023. FINDINGS: Three views of the chest and right ribs. Acute, mildly displaced fractures of the right lateral seventh and eighth ribs. Acute, nondisplaced fracture of the right lateral ninth rib. No pneumothorax. Low lung volumes accentuate the pulmonary vasculature and cardiomediastinal  silhouette. No consolidation or pulmonary edema. No pleural effusion. IMPRESSION: Acute, mildly displaced fractures of the right lateral seventh and eighth ribs. Acute, nondisplaced fracture of the right lateral ninth rib. No pneumothorax. Electronically Signed   By: Orvan Falconer M.D.   On: 05/28/2023 12:22   DG Hips Bilat W  or Wo Pelvis 3-4 Views  Result Date: 05/28/2023 CLINICAL DATA:  Trauma, fall, pain EXAM: DG HIP (WITH OR WITHOUT PELVIS) 3-4V BILAT COMPARISON:  None Available. FINDINGS: No recent fracture or dislocation is seen. There is previous left hip arthroplasty. Vascular calcifications are seen in pelvis. IMPRESSION: No recent fracture or dislocation is seen in both hips. Previous left hip arthroplasty. Electronically Signed   By: Ernie Avena M.D.   On: 05/28/2023 12:21   CT Cervical Spine Wo Contrast  Result Date: 05/28/2023 CLINICAL DATA:  87 year old female status post unwitnessed fall striking head. Right side pain. Shortness of breath. EXAM: CT CERVICAL SPINE WITHOUT CONTRAST TECHNIQUE: Multidetector CT imaging of the cervical spine was performed without intravenous contrast. Multiplanar CT image reconstructions were also generated. RADIATION DOSE REDUCTION: This exam was performed according to the departmental dose-optimization program which includes automated exposure control, adjustment of the mA and/or kV according to patient size and/or use of iterative reconstruction technique. COMPARISON:  Head CT today.  Cervical spine CT 03/15/2023. FINDINGS: Alignment: Chronic straightening of cervical lordosis. Cervicothoracic junction alignment is within normal limits. Bilateral posterior element alignment is within normal limits. Skull base and vertebrae: Bone mineralization is within normal limits. Visualized skull base is intact. No atlanto-occipital dissociation. C1 and C2 appear intact and aligned. No acute osseous abnormality identified. Soft tissues and spinal canal: No  prevertebral fluid or swelling. No visible canal hematoma. Cervical carotid calcified atherosclerosis greater on the left. Disc levels: Evidence of developing C2-C3 and chronic C3-C4 ankylosis. Widespread chronic disc, endplate, and facet degeneration. But capacious spinal canal suspected at most levels. Possible mild chronic spinal stenosis at C4-C5. Upper chest: Visible upper thoracic levels appears stable. Negative lung apices. Streak artifact from right shoulder arthroplasty. IMPRESSION: 1. No acute traumatic injury identified in the cervical spine. 2. 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 Electronically Signed   By: Odessa Fleming M.D.   On: 05/28/2023 11:45   CT Head Wo Contrast  Result Date: 05/28/2023 CLINICAL DATA:  87 year old female status post unwitnessed fall striking head. Right side pain. Shortness of breath. EXAM: CT HEAD WITHOUT CONTRAST TECHNIQUE: Contiguous axial images were obtained from the base of the skull through the vertex without intravenous contrast. RADIATION DOSE REDUCTION: This exam was performed according to the departmental dose-optimization program which includes automated exposure control, adjustment of the mA and/or kV according to patient size and/or use of iterative reconstruction technique. COMPARISON:  Head CT 03/15/2023. FINDINGS: Brain: Stable cerebral volume. No midline shift, ventriculomegaly, mass effect, evidence of mass lesion, intracranial hemorrhage or evidence of cortically based acute infarction. Stable gray-white matter differentiation throughout the brain. Mild for age scattered white matter hypodensity. Vascular: Calcified atherosclerosis at the skull base. No suspicious intracranial vascular hyperdensity. Skull: No fracture identified. Sinuses/Orbits: Previous paranasal sinus surgery. 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 Tympanic cavities and mastoids remain clear. Other: No orbit or  scalp soft tissue injury identified. IMPRESSION: 1. No acute traumatic injury identified. 2. Mild for age chronic white matter disease. 3. Chronic paranasal sinus disease. Electronically Signed   By: Odessa Fleming M.D.   On: 05/28/2023 11:42    Anti-infectives (From admission, onward)    None       Assessment/Plan Fall 6/13 R 7-9th rib fx's - No PTX. Multimodal pain control. Pulm toilet Hx cognitive impairment and gait abnormality with frequent falls - follows with Margie Ege, NP of Neurology as outpatient FEN - Reg VTE - SCDs, Lovenox ID -  None Foley - None Dispo - Admit to observation, med-surg. I have called [facility] to confirm she is on the assisted living side. They report they do not have SNF and if she needs SNF level she will have to go to another facility. PT/OT ordered. I attempted to call her son and daughter in law to provide an update without answer.   I reviewed nursing notes, last 24 h vitals and pain scores, last 48 h intake and output, last 24 h labs and trends, and last 24 h imaging results.   Jacinto Halim, [facility] Surgery 05/28/2023, 12:58 PM Please see Amion for pager number during day hours 7:00am-4:30pm

## 2023-05-28 NOTE — ED Triage Notes (Signed)
Unwitnessed fall at facility, struck her head, no LOC. C/o R sided pain, ribs especially. C/o sob on the way to the hospital. 100% O2 sats on RA

## 2023-05-28 NOTE — Progress Notes (Signed)
Pt arrived to 6 north room 20. Alert to only self. Bed in lowest position. Bed alarm on. Call light in reach. Will continue to monitor pt.

## 2023-05-29 ENCOUNTER — Observation Stay (HOSPITAL_COMMUNITY): Payer: Medicare HMO

## 2023-05-29 LAB — BASIC METABOLIC PANEL
Anion gap: 12 (ref 5–15)
BUN: 22 mg/dL (ref 8–23)
CO2: 21 mmol/L — ABNORMAL LOW (ref 22–32)
Calcium: 9 mg/dL (ref 8.9–10.3)
Chloride: 105 mmol/L (ref 98–111)
Creatinine, Ser: 0.87 mg/dL (ref 0.44–1.00)
GFR, Estimated: >60 mL/min (ref >60)
Glucose, Bld: 96 mg/dL (ref 70–99)
Potassium: 3.8 mmol/L (ref 3.5–5.1)
Sodium: 138 mmol/L (ref 135–145)

## 2023-05-29 LAB — CBC
HCT: 43.5 % (ref 36.0–46.0)
Hemoglobin: 14.4 g/dL (ref 12.0–15.0)
MCH: 30.6 pg (ref 26.0–34.0)
MCHC: 33.1 g/dL (ref 30.0–36.0)
MCV: 92.6 fL (ref 80.0–100.0)
Platelets: 129 10*3/uL — ABNORMAL LOW (ref 150–400)
RBC: 4.7 MIL/uL (ref 3.87–5.11)
RDW: 13.2 % (ref 11.5–15.5)
WBC: 10.5 10*3/uL (ref 4.0–10.5)
nRBC: 0 % (ref 0.0–0.2)

## 2023-05-29 MED ORDER — CITALOPRAM HYDROBROMIDE 20 MG PO TABS
10.0000 mg | ORAL_TABLET | Freq: Every day | ORAL | Status: DC
  Administered 2023-05-29 – 2023-06-02 (×5): 10 mg via ORAL
  Filled 2023-05-29 (×5): qty 1

## 2023-05-29 MED ORDER — ATENOLOL 25 MG PO TABS
50.0000 mg | ORAL_TABLET | Freq: Every day | ORAL | Status: DC
  Administered 2023-05-29 – 2023-06-02 (×5): 50 mg via ORAL
  Filled 2023-05-29 (×5): qty 2

## 2023-05-29 MED ORDER — ADULT MULTIVITAMIN W/MINERALS CH
1.0000 | ORAL_TABLET | Freq: Every day | ORAL | Status: DC
  Administered 2023-05-29 – 2023-06-02 (×5): 1 via ORAL
  Filled 2023-05-29 (×5): qty 1

## 2023-05-29 MED ORDER — PANTOPRAZOLE SODIUM 40 MG PO TBEC
40.0000 mg | DELAYED_RELEASE_TABLET | Freq: Every day | ORAL | Status: DC
  Administered 2023-05-29 – 2023-06-02 (×5): 40 mg via ORAL
  Filled 2023-05-29 (×5): qty 1

## 2023-05-29 MED ORDER — MIRTAZAPINE 15 MG PO TABS
15.0000 mg | ORAL_TABLET | Freq: Every day | ORAL | Status: DC
  Administered 2023-05-29 – 2023-06-01 (×4): 15 mg via ORAL
  Filled 2023-05-29 (×4): qty 1

## 2023-05-29 MED ORDER — ATORVASTATIN CALCIUM 10 MG PO TABS
20.0000 mg | ORAL_TABLET | Freq: Every day | ORAL | Status: DC
  Administered 2023-05-29 – 2023-06-02 (×5): 20 mg via ORAL
  Filled 2023-05-29 (×5): qty 2

## 2023-05-29 MED ORDER — VITAMIN D 25 MCG (1000 UNIT) PO TABS
1000.0000 [IU] | ORAL_TABLET | Freq: Every day | ORAL | Status: DC
  Administered 2023-05-29 – 2023-06-02 (×5): 1000 [IU] via ORAL
  Filled 2023-05-29 (×5): qty 1

## 2023-05-29 NOTE — Evaluation (Signed)
Occupational Therapy Evaluation Patient Details Name: Emily Escobar MRN: [id number] DOB: 07-Apr-1936 Today's Date: 05/29/2023   History of Present Illness 87 y.o. female admitted 6/13 following a fall at her ALF; resulting in Rt 7-9th rib fractures. Past medical history significant for hypertension, cognitive impairment, gait abnormality.   Clinical Impression   Pt admitted for above dx, PTA patient resided at ALF and had assist with ADLs, confused at baseline. Pt presenting with impaired balance and needs Mod A for STS and OT assist for facilitation of weight shifting and Mod A to maintain standing balance, unable to initiate steps without weight shift assist. Pt Max A to Total A for bADLs due to impaired cognition. Pain limit's Patient's AROM of BUEs and trunk mobility. Pt would benefit from continued acute skilled OT services to address above deficits and help transition to next level of care. Patient would benefit from post acute skilled rehab facility with <3 hours of therapy and 24/7 support if ALF is unable to provide additional level of care.       Recommendations for follow up therapy are one component of a multi-disciplinary discharge planning process, led by the attending physician.  Recommendations may be updated based on patient status, additional functional criteria and insurance authorization.   Assistance Recommended at Discharge Frequent or constant Supervision/Assistance  Patient can return home with the following A lot of help with walking and/or transfers;A lot of help with bathing/dressing/bathroom;Assistance with cooking/housework;Assist for transportation;Help with stairs or ramp for entrance;Direct supervision/assist for financial management;Direct supervision/assist for medications management    Functional Status Assessment  Patient has had a recent decline in their functional status and demonstrates the ability to make significant improvements in function in a reasonable and  predictable amount of time.  Equipment Recommendations  None recommended by OT (defer to next level of care)    Recommendations for Other Services       Precautions / Restrictions Precautions Precautions: Fall;Other (comment) Precaution Comments: reviewed use of pillow for bracing with movements/cough/sneeze Restrictions Weight Bearing Restrictions: No      Mobility Bed Mobility Overal bed mobility: Needs Assistance             General bed mobility comments: Pt rec'd and left sitting in recliner    Transfers Overall transfer level: Needs assistance Equipment used: Rolling walker (2 wheels) Transfers: Sit to/from Stand Sit to Stand: Mod assist           General transfer comment: Cues for hand placement and anterior scooting. Needing Mod A to maintain standing balance      Balance       Sitting balance - Comments: in recliner unable to fully assess   Standing balance support: Bilateral upper extremity supported Standing balance-Leahy Scale: Poor                             ADL either performed or assessed with clinical judgement   ADL Overall ADL's : Needs assistance/impaired Eating/Feeding: Sitting;Set up;Supervision/ safety   Grooming: Sitting   Upper Body Bathing: Sitting;Moderate assistance   Lower Body Bathing: Sitting/lateral leans;Maximal assistance   Upper Body Dressing : Sitting;Moderate assistance   Lower Body Dressing: Sitting/lateral leans;Total assistance   Toilet Transfer: Maximal assistance;Stand-pivot   Toileting- Clothing Manipulation and Hygiene: Sitting/lateral lean;Maximal assistance       Functional mobility during ADLs: Moderate assistance (Stood at bedside)       Vision  Perception     Praxis      Pertinent Vitals/Pain Pain Assessment Pain Assessment: 0-10 Pain Score: 9  Pain Location: Rt flank/ ribs with movement. Pain Descriptors / Indicators: Grimacing, Guarding Pain Intervention(s):  Monitored during session, Repositioned, Limited activity within patient's tolerance     Hand Dominance Right   Extremity/Trunk Assessment Upper Extremity Assessment Upper Extremity Assessment: Generalized weakness (Limited shoulder AROM due to rib pain, can reach 50 degrees shoulder flex)   Lower Extremity Assessment Lower Extremity Assessment: Difficult to assess due to impaired cognition       Communication Communication Communication: Expressive difficulties   Cognition Arousal/Alertness: Awake/alert Behavior During Therapy: Flat affect Overall Cognitive Status: No family/caregiver present to determine baseline cognitive functioning Area of Impairment: Orientation, Following commands, Safety/judgement, Problem solving                 Orientation Level: Disoriented to, Place, Time, Situation     Following Commands: Follows one step commands inconsistently, Follows one step commands with increased time Safety/Judgement: Decreased awareness of safety, Decreased awareness of deficits   Problem Solving: Slow processing, Decreased initiation, Difficulty sequencing, Requires verbal cues, Requires tactile cues General Comments: Intermittently following simple commands. Slow speech     General Comments  Educated on IS use    Exercises     Shoulder Instructions      Home Living Family/patient expects to be discharged to:: Assisted living Living Arrangements:  (ALF) Available Help at Discharge: Other (Comment) (ALF) Type of Home: Assisted living                       Home Equipment: Rolling Walker (2 wheels)   Additional Comments: Pt confused at baseline, unsure of accuracy. Pt reports several falls in the last year      Prior Functioning/Environment Prior Level of Function : Needs assist             Mobility Comments: Required assist to stand, has been using lift chair with staff. Hx of multiple falls. hx indicates decline in function over  time. ADLs Comments: Assist from staff at ALF with ADLs per notes.        OT Problem List: Pain;Decreased strength;Impaired balance (sitting and/or standing);Decreased range of motion;Decreased activity tolerance      OT Treatment/Interventions: Self-care/ADL training;Balance training;Therapeutic activities;Patient/family education;Therapeutic exercise    OT Goals(Current goals can be found in the care plan section) Acute Rehab OT Goals Patient Stated Goal: To get back to walking OT Goal Formulation: With patient Time For Goal Achievement: 06/12/23 Potential to Achieve Goals: Fair ADL Goals Pt Will Perform Upper Body Dressing: sitting;with min assist Pt Will Perform Lower Body Dressing: sitting/lateral leans;with min assist Pt Will Transfer to Toilet: stand pivot transfer;with min assist  OT Frequency: Min 1X/week    Co-evaluation              AM-PAC OT "6 Clicks" Daily Activity     Outcome Measure Help from another person eating meals?: A Little Help from another person taking care of personal grooming?: A Little Help from another person toileting, which includes using toliet, bedpan, or urinal?: A Lot Help from another person bathing (including washing, rinsing, drying)?: A Lot Help from another person to put on and taking off regular upper body clothing?: A Lot Help from another person to put on and taking off regular lower body clothing?: Total 6 Click Score: 13   End of Session Equipment Utilized During Treatment: Gait belt;Rolling  walker (2 wheels) Nurse Communication: Mobility status (pivots)  Activity Tolerance: Patient tolerated treatment well Patient left: in chair;with call bell/phone within reach;with chair alarm set  OT Visit Diagnosis: Unsteadiness on feet (R26.81);Other abnormalities of gait and mobility (R26.89);History of falling (Z91.81);Muscle weakness (generalized) (M62.81);Pain Pain - Right/Left: Right Pain - part of body:  (ribs)                 Time: 1610-9604 OT Time Calculation (min): 21 min Charges:  OT General Charges $OT Visit: 1 Visit OT Evaluation $OT Eval Moderate Complexity: 1 Mod  05/29/2023  AB, OTR/L  Acute Rehabilitation Services  Office: [phone]   Tristan Schroeder 05/29/2023, 1:26 PM

## 2023-05-29 NOTE — Progress Notes (Signed)
[facility] Progress Note     Subjective: CC:  Denies chest pain, abdominal pain, or nausea. Confirms that her son and daughter-in-law help her when she needs it.   Objective: Vital signs in last 24 hours: Temp:  [97.5 F (36.4 C)-98.7 F (37.1 C)] 97.8 F (36.6 C) (06/14 0743) Pulse Rate:  [58-71] 71 (06/14 0743) Resp:  [16-25] 16 (06/14 0743) BP: (160-183)/(67-81) 165/67 (06/14 0743) SpO2:  [92 %-100 %] 94 % (06/14 0743)    Intake/Output from previous day: 06/13 0701 - 06/14 0700 In: 280 [P.O.:280] Out: 500 [Urine:500] Intake/Output this shift: No intake/output data recorded.  PE: Gen:  Alert, NAD, chronically ill appearing Card:  Regular rate and rhythm, Pulm:  Normal effort ORA, clear to auscultation bilaterally Abd: Soft, nontender, mild distention, palpable ventral hernia -?chronically incarcerated. Nontender.  Skin: warm and dry Psych: oriented to person, fall  Lab Results:  Recent Labs    05/28/23 1038 05/29/23 0025  WBC 9.6 10.5  HGB 14.8 14.4  HCT 44.6 43.5  PLT 144* 129*   BMET Recent Labs    05/28/23 1038 05/29/23 0025  NA 139 138  K 4.0 3.8  CL 106 105  CO2 25 21*  GLUCOSE 107* 96  BUN 18 22  CREATININE 0.84 0.87  CALCIUM 9.1 9.0   PT/INR No results for input(s): "LABPROT", "INR" in the last 72 hours. CMP     Component Value Date/Time   NA 138 05/29/2023 0025   NA 145 (H) 04/02/2021 1136   K 3.8 05/29/2023 0025   CL 105 05/29/2023 0025   CO2 21 (L) 05/29/2023 0025   GLUCOSE 96 05/29/2023 0025   BUN 22 05/29/2023 0025   BUN 19 04/02/2021 1136   CREATININE 0.87 05/29/2023 0025   CALCIUM 9.0 05/29/2023 0025   PROT 6.4 (L) 08/20/2022 1835   PROT 6.4 04/02/2021 1136   ALBUMIN 4.0 08/20/2022 1835   ALBUMIN 4.3 04/02/2021 1136   AST 22 08/20/2022 1835   ALT 26 08/20/2022 1835   ALKPHOS 78 08/20/2022 1835   BILITOT 0.8 08/20/2022 1835   BILITOT 0.4 04/02/2021 1136   GFRNONAA >60 05/29/2023 0025   GFRAA >60  07/19/2016 0444   Lipase  No results found for: "LIPASE"     Studies/Results: DG Ribs Unilateral W/Chest Right  Result Date: 05/28/2023 CLINICAL DATA:  Rib pain and shortness of breath. EXAM: RIGHT RIBS AND CHEST - 3+ VIEW COMPARISON:  Chest radiograph 03/15/2023. FINDINGS: Three views of the chest and right ribs. Acute, mildly displaced fractures of the right lateral seventh and eighth ribs. Acute, nondisplaced fracture of the right lateral ninth rib. No pneumothorax. Low lung volumes accentuate the pulmonary vasculature and cardiomediastinal silhouette. No consolidation or pulmonary edema. No pleural effusion. IMPRESSION: Acute, mildly displaced fractures of the right lateral seventh and eighth ribs. Acute, nondisplaced fracture of the right lateral ninth rib. No pneumothorax. Electronically Signed   By: Orvan Falconer M.D.   On: 05/28/2023 12:22   DG Hips Bilat W or Wo Pelvis 3-4 Views  Result Date: 05/28/2023 CLINICAL DATA:  Trauma, fall, pain EXAM: DG HIP (WITH OR WITHOUT PELVIS) 3-4V BILAT COMPARISON:  None Available. FINDINGS: No recent fracture or dislocation is seen. There is previous left hip arthroplasty. Vascular calcifications are seen in pelvis. IMPRESSION: No recent fracture or dislocation is seen in both hips. Previous left hip arthroplasty. Electronically Signed   By: Ernie Avena M.D.   On: 05/28/2023 12:21   CT Cervical Spine Wo Contrast  Result Date: 05/28/2023 CLINICAL DATA:  87 year old female status post unwitnessed fall striking head. Right side pain. Shortness of breath. EXAM: CT CERVICAL SPINE WITHOUT CONTRAST TECHNIQUE: Multidetector CT imaging of the cervical spine was performed without intravenous contrast. Multiplanar CT image reconstructions were also generated. RADIATION DOSE REDUCTION: This exam was performed according to the departmental dose-optimization program which includes automated exposure control, adjustment of the mA and/or kV according to patient  size and/or use of iterative reconstruction technique. COMPARISON:  Head CT today.  Cervical spine CT 03/15/2023. FINDINGS: Alignment: Chronic straightening of cervical lordosis. Cervicothoracic junction alignment is within normal limits. Bilateral posterior element alignment is within normal limits. Skull base and vertebrae: Bone mineralization is within normal limits. Visualized skull base is intact. No atlanto-occipital dissociation. C1 and C2 appear intact and aligned. No acute osseous abnormality identified. Soft tissues and spinal canal: No prevertebral fluid or swelling. No visible canal hematoma. Cervical carotid calcified atherosclerosis greater on the left. Disc levels: Evidence of developing C2-C3 and chronic C3-C4 ankylosis. Widespread chronic disc, endplate, and facet degeneration. But capacious spinal canal suspected at most levels. Possible mild chronic spinal stenosis at C4-C5. Upper chest: Visible upper thoracic levels appears stable. Negative lung apices. Streak artifact from right shoulder arthroplasty. IMPRESSION: 1. No acute traumatic injury identified in the cervical spine. 2. 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 Electronically Signed   By: Odessa Fleming M.D.   On: 05/28/2023 11:45   CT Head Wo Contrast  Result Date: 05/28/2023 CLINICAL DATA:  87 year old female status post unwitnessed fall striking head. Right side pain. Shortness of breath. EXAM: CT HEAD WITHOUT CONTRAST TECHNIQUE: Contiguous axial images were obtained from the base of the skull through the vertex without intravenous contrast. RADIATION DOSE REDUCTION: This exam was performed according to the departmental dose-optimization program which includes automated exposure control, adjustment of the mA and/or kV according to patient size and/or use of iterative reconstruction technique. COMPARISON:  Head CT 03/15/2023. FINDINGS: Brain: Stable cerebral volume. No midline shift, ventriculomegaly,  mass effect, evidence of mass lesion, intracranial hemorrhage or evidence of cortically based acute infarction. Stable gray-white matter differentiation throughout the brain. Mild for age scattered white matter hypodensity. Vascular: Calcified atherosclerosis at the skull base. No suspicious intracranial vascular hyperdensity. Skull: No fracture identified. Sinuses/Orbits: Previous paranasal sinus surgery. 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 Tympanic cavities and mastoids remain clear. Other: No orbit or scalp soft tissue injury identified. IMPRESSION: 1. No acute traumatic injury identified. 2. Mild for age chronic white matter disease. 3. Chronic paranasal sinus disease. Electronically Signed   By: Odessa Fleming M.D.   On: 05/28/2023 11:42    Anti-infectives: Anti-infectives (From admission, onward)    None      Assessment/Plan Fall 6/13 R 7-9th rib fx's - No PTX. Multimodal pain control. Pulm toilet. Follow up CXR this morning pending Hx cognitive impairment and gait abnormality with frequent falls - follows with Margie Ege, NP of Neurology as outpatient FEN - Reg VTE - SCDs, Lovenox ID - None Foley - None  Home atenolol, celexa, remeron, statin, prilosec, and vitamins ordered.   Dispo - Admit to observation, med-surg.[facility] does not have SNF and if she needs SNF level she will have to go to another facility. PT/OT ordered.   I spoke to the patients daughter-in-law (who was sitting with the patients son) over the phone. I gave her a clinical update and answered her questions. They are currently in [place] visiting their  daughter with plans to come back to [place] on Tuesday.   According to family the patient spent 5 years in independent living, had several falls starting at the end of 2023. Just moved into assisted living June 1st. Has been using a walker for about 3 years. Lift chair and hospital bed recently ordered and hospital bed just  delivered. Able to walk but usually asks for assistance to get up out of a chair. Usually asks for assistance after using the bathroom/doing certain ADLs. Family states that once they got her an aide (before the move to ALF) the patient became less confident with mobility and relied heavily on the aide.     LOS: 0 days   I reviewed nursing notes, last 24 h vitals and pain scores, last 48 h intake and output, last 24 h labs and trends, and last 24 h imaging results.  This care required moderate level of medical decision making.   Hosie Spangle, PA-C [facility] Please see Amion for pager number during day hours 7:00am-4:30pm

## 2023-05-29 NOTE — Evaluation (Signed)
Physical Therapy Evaluation Patient Details Name: Emily Escobar MRN: [id number] DOB: 1936-03-09 Today's Date: 05/29/2023  History of Present Illness  87 y.o. female admitted 6/13 following a fall at her ALF; resulting in Rt 7-9th rib fractures. Past medical history significant for hypertension, cognitive impairment, gait abnormality.  Clinical Impression  Pt admitted with above diagnosis. PTA patient recently transitioned from ILF with an aide, to ALF at [facility]. Has been needing assistance with transfers and some ADLs. Today she required up to mod assist to safely mobilize. Showing heavy posterior bias with standing but preserved LE strength. Frequent cues for sequencing with short distance forward gait. Oriented to self and that that she is in a hospital however, cannot recall year, month, city, or situation. Educated on IS use for pulmonary care and bracing with a pillow on Rt chest wall during mobility. Performed some LE exercises and encouraged to perform throughout the day to delay muscle atrophy from immobility. Patient will benefit from continued inpatient follow up therapy, <3 hours/day. Pt currently with functional limitations due to the deficits listed below (see PT Problem List). Pt will benefit from acute skilled PT to increase their independence and safety with mobility to allow discharge.          Recommendations for follow up therapy are one component of a multi-disciplinary discharge planning process, led by the attending physician.  Recommendations may be updated based on patient status, additional functional criteria and insurance authorization.  Follow Up Recommendations Can patient physically be transported by private vehicle: No (likely soon)     Assistance Recommended at Discharge Frequent or constant Supervision/Assistance  Patient can return home with the following  A lot of help with walking and/or transfers;A lot of help with bathing/dressing/bathroom;Assistance with  cooking/housework;Assist for transportation    Equipment Recommendations None recommended by PT  Recommendations for Other Services       Functional Status Assessment Patient has had a recent decline in their functional status and demonstrates the ability to make significant improvements in function in a reasonable and predictable amount of time.     Precautions / Restrictions Precautions Precautions: Fall;Other (comment) (Rib fractures on Rt) Precaution Comments: reviewed use of pillow for bracing with movements/cough/sneeze Restrictions Weight Bearing Restrictions: No      Mobility  Bed Mobility Overal bed mobility: Needs Assistance Bed Mobility: Rolling, Sidelying to Sit Rolling: Min assist Sidelying to sit: Mod assist       General bed mobility comments: Min assist with roll and LE set up for leverage, heavy VC for technique. Mod assist for LE out of bed and trunk support to rise. Rolled to Lt side, held pillow against Rt trunk for bracing, tolerated well.    Transfers Overall transfer level: Needs assistance Equipment used: Rolling walker (2 wheels) Transfers: Sit to/from Stand Sit to Stand: Mod assist, From elevated surface           General transfer comment: Mod assist for balance, good LE strength to push up however rigid with heavy posterior lean. Cues for RW use and support.    Ambulation/Gait Ambulation/Gait assistance: Min assist Gait Distance (Feet): 6 Feet Assistive device: Rolling walker (2 wheels) Gait Pattern/deviations: Step-through pattern, Step-to pattern, Decreased stride length, Shuffle, Leaning posteriorly, Narrow base of support Gait velocity: slow Gait velocity interpretation: <1.31 ft/sec, indicative of household ambulator Pre-gait activities: weight shfit General Gait Details: Educated on safe AD use with RW. Cues for sequencing, and forward lean onto RW for support to steady herself and facilitate better  base of support. Cues to widen  feet with steps. Fatigued easily. Unable to take steps backwards, and assisted with chair to sit.  Stairs            Wheelchair Mobility    Modified Rankin (Stroke Patients Only)       Balance Overall balance assessment: Needs assistance Sitting-balance support: Feet supported, Single extremity supported Sitting balance-Leahy Scale: Poor   Postural control: Posterior lean Standing balance support: Bilateral upper extremity supported Standing balance-Leahy Scale: Poor                               Pertinent Vitals/Pain Pain Assessment Pain Assessment: Faces Faces Pain Scale: Hurts even more Pain Location: Rt flank/ ribs with movement. Pain Descriptors / Indicators: Grimacing, Guarding Pain Intervention(s): Monitored during session, Repositioned, Limited activity within patient's tolerance, Premedicated before session    Home Living Family/patient expects to be discharged to:: Skilled nursing facility Living Arrangements: Other (Comment) (ALF) Available Help at Discharge: Other (Comment) (From ALF) Type of Home: Assisted living           Home Equipment:  (Does not provide information at this time.) Additional Comments: Information obtained from prior notes, and recent discussion between PA and family who provided helpful information.    Prior Function Prior Level of Function : Needs assist             Mobility Comments: Required assist to stand, has been using lift chair with staff. Hx of multiple falls. hx indicates decline in function over time. ADLs Comments: Assist from staff at ALF with ADLs per notes.     Hand Dominance        Extremity/Trunk Assessment   Upper Extremity Assessment Upper Extremity Assessment: Defer to OT evaluation    Lower Extremity Assessment Lower Extremity Assessment: Difficult to assess due to impaired cognition       Communication   Communication: Expressive difficulties  Cognition Arousal/Alertness:  Awake/alert Behavior During Therapy: Flat affect Overall Cognitive Status: No family/caregiver present to determine baseline cognitive functioning Area of Impairment: Orientation, Following commands, Safety/judgement, Problem solving                 Orientation Level: Disoriented to, Place, Time, Situation (Recalls name only)     Following Commands: Follows one step commands inconsistently, Follows one step commands with increased time Safety/Judgement: Decreased awareness of safety, Decreased awareness of deficits   Problem Solving: Slow processing, Decreased initiation, Difficulty sequencing, Requires verbal cues, Requires tactile cues General Comments: Intermittently following simple commands        General Comments General comments (skin integrity, edema, etc.): Educated on IS use    Exercises General Exercises - Lower Extremity Ankle Circles/Pumps: AROM, Both, 10 reps, Seated Quad Sets: AROM, Both, Strengthening, 10 reps, Seated Gluteal Sets: Strengthening, Both, 10 reps, Seated   Assessment/Plan    PT Assessment Patient needs continued PT services  PT Problem List Decreased strength;Decreased range of motion;Decreased activity tolerance;Decreased balance;Decreased mobility;Decreased coordination;Decreased cognition;Decreased knowledge of use of DME;Decreased safety awareness;Decreased knowledge of precautions;Cardiopulmonary status limiting activity;Obesity;Pain       PT Treatment Interventions DME instruction;Gait training;Functional mobility training;Therapeutic exercise;Therapeutic activities;Balance training;Neuromuscular re-education;Cognitive remediation;Patient/family education;Modalities    PT Goals (Current goals can be found in the Care Plan section)  Acute Rehab PT Goals Patient Stated Goal: none stated other than to get out of the bed PT Goal Formulation: Patient unable to participate in goal setting Time  For Goal Achievement: 06/12/23 Potential to  Achieve Goals: Good    Frequency Min 3X/week     Co-evaluation               AM-PAC PT "6 Clicks" Mobility  Outcome Measure Help needed turning from your back to your side while in a flat bed without using bedrails?: A Little Help needed moving from lying on your back to sitting on the side of a flat bed without using bedrails?: A Lot Help needed moving to and from a bed to a chair (including a wheelchair)?: A Lot Help needed standing up from a chair using your arms (e.g., wheelchair or bedside chair)?: A Lot Help needed to walk in hospital room?: A Lot Help needed climbing 3-5 steps with a railing? : Total 6 Click Score: 12    End of Session Equipment Utilized During Treatment: Gait belt Activity Tolerance: Patient tolerated treatment well Patient left: in chair;with call bell/phone within reach;with chair alarm set Nurse Communication: Other (comment) (NT on transfer) PT Visit Diagnosis: Unsteadiness on feet (R26.81);Repeated falls (R29.6);Other abnormalities of gait and mobility (R26.89);Muscle weakness (generalized) (M62.81);History of falling (Z91.81);Difficulty in walking, not elsewhere classified (R26.2);Pain Pain - Right/Left: Right Pain - part of body:  (ribs)    Time: 1914-7829 PT Time Calculation (min) (ACUTE ONLY): 16 min   Charges:   PT Evaluation $PT Eval Low Complexity: 1 Low          Kathlyn Sacramento, PT, DPT [facility]  Rehabilitation Services Physical Therapist Office: [phone] Website: [email or web address]   Berton Mount 05/29/2023, 12:42 PM

## 2023-05-29 NOTE — TOC CAGE-AID Note (Signed)
Transition of Care [facility]) - CAGE-AID Screening   Patient Details  Name: Emily Escobar MRN: [id number] Date of Birth: [birth year]    [name], RN Trauma Response Nurse Phone Number: [phone] 05/29/2023, 2:37 PM   Clinical Narrative:  Pt sitting in chair, watching TV- states she lives at [facility], does not drink.  CAGE-AID Screening:    Have You Ever Felt You Ought to Cut Down on Your Drinking or Drug Use?: No Have People Annoyed You By Critizing Your Drinking Or Drug Use?: No Have You Felt Bad Or Guilty About Your Drinking Or Drug Use?: No Have You Ever Had a Drink or Used Drugs First Thing In The Morning to Steady Your Nerves or to Get Rid of a Hangover?: No CAGE-AID Score: 0  Substance Abuse Education Offered: No (denies alcohol/drug use. Lives at [facility]- assisted living facility)

## 2023-05-30 NOTE — TOC Initial Note (Signed)
Transition of Care [facility]) - Initial/Assessment Note    Patient Details  Name: Emily Escobar MRN: [id number] Date of Birth: 1936/10/03  Transition of Care [facility]) CM/SW Contact:    Leander Rams, LCSW Phone Number: 05/30/2023, 10:38 AM  Clinical Narrative:                 CSW spoke with pt HCPOA son Doreatha Martin and daughter in [place]. Pt family states she has done STR at few years ago at [facility]. [place] [facility]. Family is agreeable to fax out referral for SNF.   CSW will complete fl2 and fax out. TOC will continue to follow.   Expected Discharge Plan: Skilled Nursing Facility Barriers to Discharge: Continued Medical Work up   Patient Goals and CMS Choice            Expected Discharge Plan and Services       Living arrangements for the past 2 months: Assisted Living Facility [facility])                                      Prior Living Arrangements/Services Living arrangements for the past 2 months: Assisted Living Facility Information systems manager) Lives with:: Self Patient language and need for interpreter reviewed::  (Assessment completed with son and daughter in law) Do you feel safe going back to the place where you live?: Yes (Pt famly believes she needs rehab but are comfortable with her going back there)      Need for Family Participation in Patient Care: Yes (Comment) Care giver support system in place?: Yes (comment) Current home services: DME Criminal Activity/Legal Involvement Pertinent to Current Situation/Hospitalization: No - Comment as needed  Activities of Daily Living      Permission Sought/Granted Permission sought to share information with : Facility Medical sales representative    Share Information with NAME: Sam     Permission granted to share info w Relationship: Son  Permission granted to share info w Contact Information: [phone]  Emotional Assessment   Attitude/Demeanor/Rapport: Unable to Assess Affect (typically  observed): Unable to Assess Orientation: : Oriented to Self Alcohol / Substance Use: Not Applicable Psych Involvement: No (comment)  Admission diagnosis:  Rib fractures [S22.49XA] Fall, initial encounter [W19.XXXA] Open fracture of multiple ribs of right side, initial encounter [S22.41XB] Patient Active Problem List   Diagnosis Date Noted   Rib fractures 05/28/2023   Parkinsonism 09/23/2022   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   Cognitive impairment 04/02/2021   Gait abnormality 04/02/2021   Slurred speech 04/02/2021   Retinal pigment epithelium serous detachment, 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   Angina pectoris (HCC) 09/04/2020   Dyspnea on exertion 09/04/2020   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 choroidal neovascularization (HCC) 08/30/2020   Intermediate stage nonexudative age-related macular degeneration of both eyes 08/30/2020   Closed left femoral fracture, initial encounter 07/17/2016   Essential hypertension 07/17/2016   Anxiety and depression 07/17/2016   PCP:  Pcp, No Pharmacy:  No Pharmacies Listed    Social Determinants of Health (SDOH) Social History: SDOH Screenings   Tobacco Use: Low Risk  (05/28/2023)   SDOH Interventions:     Readmission Risk Interventions     No data to display         Oletta Lamas, MSW, LCSWA, LCASA Transitions of Care  Clinical Social Worker I
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
## 2023-05-30 NOTE — NC FL2 (Signed)
[place] MEDICAID FL2 LEVEL OF CARE FORM     IDENTIFICATION  Patient Name: Emily Escobar Birthdate: [birth year] Sex: female Admission Date (Current Location): 05/28/2023  [facility] and [place] Number:  Producer, television/film/video and Address:  [facility], 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   Cognitive impairment 04/02/2021   Gait abnormality 04/02/2021   Slurred speech 04/02/2021   Retinal pigment epithelium serous detachment, 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   Angina pectoris (HCC) 09/04/2020   Dyspnea on exertion 09/04/2020   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 choroidal neovascularization (HCC) 08/30/2020   Intermediate stage nonexudative age-related macular degeneration of both eyes 08/30/2020   Closed left femoral fracture, initial encounter 07/17/2016   Essential hypertension 07/17/2016   Anxiety and depression 07/17/2016    Orientation RESPIRATION BLADDER Height & Weight     Self  Normal Incontinent, External catheter Weight:   Height:     BEHAVIORAL SYMPTOMS/MOOD NEUROLOGICAL BOWEL NUTRITION STATUS      Continent Diet (See dc summary)  AMBULATORY STATUS COMMUNICATION OF NEEDS Skin   Extensive Assist Verbally (Difficulty speaking) Normal                       Personal Care Assistance Level of Assistance  Bathing, Feeding, Dressing Bathing Assistance: Maximum assistance Feeding assistance: Limited assistance Dressing  Assistance: Maximum assistance     Functional Limitations Info  Sight, Hearing, Speech Sight Info: Adequate Hearing Info: Impaired Speech Info: Impaired (Difficulty speaking)    SPECIAL CARE FACTORS FREQUENCY  PT (By licensed PT), OT (By licensed OT)     PT Frequency: 5xweek OT Frequency: 5xweek            Contractures Contractures Info: Not present    Additional Factors Info                  Current Medications (05/30/2023):  This is the current hospital active medication list Current Facility-Administered Medications  Medication Dose Route Frequency Provider Last Rate Last Admin   acetaminophen (TYLENOL) tablet 1,000 mg  1,000 mg Oral Q6H Maczis, Elmer Sow, PA-C   1,000 mg at 05/30/23 0530   atenolol (TENORMIN) tablet 50 mg  50 mg Oral Daily Adam Phenix, PA-C   50 mg at 05/30/23 1051   atorvastatin (LIPITOR) tablet 20 mg  20 mg Oral Daily Adam Phenix, PA-C   20 mg at 05/30/23 1051   cholecalciferol (VITAMIN D3) 25 MCG (1000 UNIT) tablet 1,000 Units  1,000 Units Oral Daily Adam Phenix, PA-C   1,000 Units at 05/30/23 1051   citalopram (CELEXA) tablet 10 mg  10 mg Oral Daily Adam Phenix, PA-C   10 mg at 05/30/23 1051   docusate sodium (COLACE) capsule 100 mg  100 mg Oral BID Jacinto Halim, PA-C   100 mg at 05/30/23 1052   enoxaparin (LOVENOX) injection 30 mg  30 mg Subcutaneous Q12H Leary Roca  M, PA-C   30 mg at 05/30/23 1051   hydrALAZINE (APRESOLINE) injection 10 mg  10 mg Intravenous Q2H PRN Jacinto Halim, PA-C   10 mg at 05/30/23 0323   lidocaine (LIDODERM) 5 % 1 patch  1 patch Transdermal Q24H Mannie Stabile, PA-C   1 patch at 05/29/23 1323   methocarbamol (ROBAXIN) tablet 500 mg  500 mg Oral Q8H Jacinto Halim, PA-C   500 mg at 05/30/23 0530   Or   methocarbamol (ROBAXIN) 500 mg in dextrose 5 % 50 mL IVPB  500 mg Intravenous Q8H Maczis, Michael M, PA-C       metoprolol tartrate (LOPRESSOR) injection 5 mg  5 mg  Intravenous Q6H PRN Maczis, Elmer Sow, PA-C       mirtazapine (REMERON) tablet 15 mg  15 mg Oral QHS Adam Phenix, PA-C   15 mg at 05/29/23 2326   multivitamin with minerals tablet 1 tablet  1 tablet Oral Daily Adam Phenix, PA-C   1 tablet at 05/30/23 1051   ondansetron (ZOFRAN-ODT) disintegrating tablet 4 mg  4 mg Oral Q6H PRN Jacinto Halim, PA-C       Or   ondansetron [facility]) injection 4 mg  4 mg Intravenous Q6H PRN Maczis, Elmer Sow, PA-C       oxyCODONE (Oxy IR/ROXICODONE) immediate release tablet 2.5-5 mg  2.5-5 mg Oral Q4H PRN Jacinto Halim, PA-C   5 mg at 05/29/23 1518   pantoprazole (PROTONIX) EC tablet 40 mg  40 mg Oral Daily Adam Phenix, PA-C   40 mg at 05/30/23 1051   polyethylene glycol (MIRALAX / GLYCOLAX) packet 17 g  17 g Oral Daily PRN Jacinto Halim, PA-C         Discharge Medications: Please see discharge summary for a list of discharge medications.  Relevant Imaging Results:  Relevant Lab Results:   Additional Information [id number]  Oletta Lamas, MSW, LCSWA, LCASA Transitions of Care  Clinical Social Worker I

## 2023-05-31 LAB — BASIC METABOLIC PANEL
Anion gap: 14 (ref 5–15)
BUN: 24 mg/dL — ABNORMAL HIGH (ref 8–23)
CO2: 21 mmol/L — ABNORMAL LOW (ref 22–32)
Calcium: 9 mg/dL (ref 8.9–10.3)
Chloride: 103 mmol/L (ref 98–111)
Creatinine, Ser: 0.68 mg/dL (ref 0.44–1.00)
GFR, Estimated: >60 mL/min (ref >60)
Glucose, Bld: 97 mg/dL (ref 70–99)
Potassium: 3.9 mmol/L (ref 3.5–5.1)
Sodium: 138 mmol/L (ref 135–145)

## 2023-05-31 MED ORDER — ENSURE ENLIVE PO LIQD
237.0000 mL | Freq: Three times a day (TID) | ORAL | Status: DC
  Administered 2023-05-31 – 2023-06-02 (×7): 237 mL via ORAL

## 2023-05-31 MED ORDER — GUAIFENESIN-DM 100-10 MG/5ML PO SYRP
10.0000 mL | ORAL_SOLUTION | ORAL | Status: DC | PRN
  Administered 2023-05-31: 10 mL via ORAL
  Filled 2023-05-31: qty 10

## 2023-05-31 NOTE — Progress Notes (Signed)
[facility] Progress Note     Subjective: CC:  Resting comfortably  Objective: Vital signs in last 24 hours: Temp:  [97.5 F (36.4 C)-99.4 F (37.4 C)] 97.9 F (36.6 C) (06/16 0823) Pulse Rate:  [57-72] 58 (06/16 0823) Resp:  [16-17] 17 (06/16 0823) BP: (147-165)/(68-73) 147/68 (06/16 0823) SpO2:  [90 %-96 %] 96 % (06/16 0823)    Intake/Output from previous day: No intake/output data recorded. Intake/Output this shift: No intake/output data recorded.  PE: Gen:  Alert, NAD, chronically ill appearing Card:  Regular rate and rhythm, Pulm:  Normal effort ORA, clear to auscultation bilaterally Abd: Soft, nontender, mild distention, palpable ventral hernia -?chronically incarcerated. Nontender.  Skin: warm and dry Psych: oriented to person, hospital.   Lab Results:  Recent Labs    05/28/23 1038 05/29/23 0025  WBC 9.6 10.5  HGB 14.8 14.4  HCT 44.6 43.5  PLT 144* 129*   BMET Recent Labs    05/29/23 0025 05/31/23 0358  NA 138 138  K 3.8 3.9  CL 105 103  CO2 21* 21*  GLUCOSE 96 97  BUN 22 24*  CREATININE 0.87 0.68  CALCIUM 9.0 9.0   PT/INR No results for input(s): "LABPROT", "INR" in the last 72 hours. CMP     Component Value Date/Time   NA 138 05/31/2023 0358   NA 145 (H) 04/02/2021 1136   K 3.9 05/31/2023 0358   CL 103 05/31/2023 0358   CO2 21 (L) 05/31/2023 0358   GLUCOSE 97 05/31/2023 0358   BUN 24 (H) 05/31/2023 0358   BUN 19 04/02/2021 1136   CREATININE 0.68 05/31/2023 0358   CALCIUM 9.0 05/31/2023 0358   PROT 6.4 (L) 08/20/2022 1835   PROT 6.4 04/02/2021 1136   ALBUMIN 4.0 08/20/2022 1835   ALBUMIN 4.3 04/02/2021 1136   AST 22 08/20/2022 1835   ALT 26 08/20/2022 1835   ALKPHOS 78 08/20/2022 1835   BILITOT 0.8 08/20/2022 1835   BILITOT 0.4 04/02/2021 1136   GFRNONAA >60 05/31/2023 0358   GFRAA >60 07/19/2016 0444   Lipase  No results found for: "LIPASE"     Studies/Results: No results  found.  Anti-infectives: Anti-infectives (From admission, onward)    None      Assessment/Plan Fall 6/13 R 7-9th rib fx's - No PTX. Multimodal pain control. Pulm toilet. Follow up CXR 6/14 negative for PTX Hx cognitive impairment and gait abnormality with frequent falls - follows with Margie Ege, NP of Neurology as outpatient FEN - Reg VTE - SCDs, Lovenox ID - None Foley - None  Home atenolol, celexa, remeron, statin, prilosec, and vitamins ordered.   Dispo - SNF, TOC working on placement, appreciate their care.  Delirium precautions    LOS: 0 days   I reviewed nursing notes, last 24 h vitals and pain scores, last 48 h intake and output, last 24 h labs and trends, and last 24 h imaging results.  This care required moderate level of medical decision making.   Hosie Spangle, PA-C [facility] Please see Amion for pager number during day hours 7:00am-4:30pm

## 2023-06-01 MED ORDER — IBUPROFEN 600 MG PO TABS: 600.0000 mg | ORAL_TABLET | Freq: Once | ORAL | Status: DC

## 2023-06-01 MED ORDER — IBUPROFEN 400 MG PO TABS: 400.0000 mg | ORAL_TABLET | Freq: Four times a day (QID) | ORAL | Status: DC | PRN

## 2023-06-01 MED ORDER — HYDRALAZINE HCL 20 MG/ML IJ SOLN: 5.0000 mg | Freq: Four times a day (QID) | INTRAMUSCULAR | Status: DC | PRN

## 2023-06-01 MED ORDER — KETOROLAC TROMETHAMINE 15 MG/ML IJ SOLN
15.0000 mg | Freq: Once | INTRAMUSCULAR | Status: DC
  Filled 2023-06-01: qty 1

## 2023-06-01 NOTE — Progress Notes (Addendum)
[facility] Progress Note     Subjective: CC:  Resting comfortably. Emily Escobar. Oriented to self only and agrees she had a fall.   Objective: Vital signs in last 24 hours: Temp:  [97.4 F (36.3 C)-98.3 F (36.8 C)] 97.4 F (36.3 C) (06/17 0748) Pulse Rate:  [58-74] 64 (06/17 0748) Resp:  [16-20] 16 (06/17 0748) BP: (147-187)/(64-75) 171/72 (06/17 0748) SpO2:  [94 %-98 %] 96 % (06/17 0748)    Intake/Output from previous day: 06/16 0701 - 06/17 0700 In: 240 [P.O.:240] Out: 400 [Urine:400] Intake/Output this shift: No intake/output data recorded.  PE: Gen:  Alert, NAD, chronically ill appearing Card:  Regular rate and rhythm, Pulm:  Normal effort ORA, clear to auscultation bilaterally Skin: warm and dry Psych: oriented to person, hospital.   Lab Results:  No results for input(s): "WBC", "HGB", "HCT", "PLT" in the last 72 hours.  BMET Recent Labs    05/31/23 0358  NA 138  K 3.9  CL 103  CO2 21*  GLUCOSE 97  BUN 24*  CREATININE 0.68  CALCIUM 9.0   PT/INR No results for input(s): "LABPROT", "INR" in the last 72 hours. CMP     Component Value Date/Time   NA 138 05/31/2023 0358   NA 145 (H) 04/02/2021 1136   K 3.9 05/31/2023 0358   CL 103 05/31/2023 0358   CO2 21 (L) 05/31/2023 0358   GLUCOSE 97 05/31/2023 0358   BUN 24 (H) 05/31/2023 0358   BUN 19 04/02/2021 1136   CREATININE 0.68 05/31/2023 0358   CALCIUM 9.0 05/31/2023 0358   PROT 6.4 (L) 08/20/2022 1835   PROT 6.4 04/02/2021 1136   ALBUMIN 4.0 08/20/2022 1835   ALBUMIN 4.3 04/02/2021 1136   AST 22 08/20/2022 1835   ALT 26 08/20/2022 1835   ALKPHOS 78 08/20/2022 1835   BILITOT 0.8 08/20/2022 1835   BILITOT 0.4 04/02/2021 1136   GFRNONAA >60 05/31/2023 0358   GFRAA >60 07/19/2016 0444   Lipase  No results found for: "LIPASE"     Studies/Results: No results found.  Anti-infectives: Anti-infectives (From admission, onward)    None      Assessment/Plan Fall 6/13 R 7-9th rib  fx's - No PTX. Multimodal pain control. Pulm toilet. Follow up CXR 6/14 negative for PTX Hx cognitive impairment and gait abnormality with frequent falls - follows with Margie Ege, NP of Neurology as outpatient FEN - Reg VTE - SCDs, Lovenox ID - None Foley - None  Home atenolol, celexa, remeron, statin, prilosec, and vitamins ordered.   Dispo - SNF, TOC working on placement, appreciate their care.  Delirium precautions  Has been hypertensive, monitor. May need another antihypertensive added.  LOS: 0 days   I reviewed nursing notes, last 24 h vitals and pain scores, last 48 h intake and output, last 24 h labs and trends, and last 24 h imaging results.  This care required moderate level of medical decision making.   Hosie Spangle, PA-C [facility] Please see Amion for pager number during day hours 7:00am-4:30pm

## 2023-06-01 NOTE — TOC Progression Note (Signed)
Transition of Care [facility]) - Progression Note    Patient Details  Name: Paije Winwood MRN: [id number] Date of Birth: 21-Aug-1936  Transition of Care [facility]) CM/SW Contact  Glennon Mac, RN Phone Number: 06/01/2023, 10:14 AM  Clinical Narrative:    Spoke with patient's daughter in law, Eunice Blase, by phone, to discuss bed offers. Patient has been a patient at [facility] before, but Eunice Blase would like to see if [facility] or [facility] may be an option.  Left messages with both facilities, requesting call back.    Addendum: 10:30 Received call from Whitney at [facility]; they currently have no bed availability.    Addendum: 14:30 Multiple messages to [facility] today without return call.  Spoke with Eunice Blase, and she is agreeable to patient going to [facility].  [facility] with [facility]; we will initiate insurance authorization through [facility]. Requested PASSR number, and additional information sent to [place] MUST.    Addendum: [employer] authorization received; patient approved from 6/18 - 6/20 ; NRD 6/20 [employer] Auth [id number]. Facility notified; admissions states they will have a bed on 06/02/23 pending PASSR.        Expected Discharge Plan: Skilled Nursing Facility Barriers to Discharge: Continued Medical Work up  Expected Discharge Plan and Services       Living arrangements for the past 2 months: Assisted Living Facility [facility])                                       Social Determinants of Health (SDOH) Interventions SDOH Screenings   Tobacco Use: Low Risk  (05/28/2023)    Readmission Risk Interventions     No data to display         Quintella Baton, RN, BSN  Trauma/Neuro ICU Case Manager [phone]

## 2023-06-01 NOTE — Progress Notes (Signed)
Physical Therapy Treatment Patient Details Name: Emily Escobar MRN: [id number] DOB: Sep 21, 1936 Today's Date: 06/01/2023   History of Present Illness 87 y.o. female admitted 6/13 following a fall at her ALF; resulting in Rt 7-9th rib fractures. Past medical history significant for hypertension, cognitive impairment, gait abnormality.    PT Comments    Pt tolerated treatment well today. Pt was able to progress ambulation in room with RW Min A however did require more assistance standing today compared to previous session. Pt continues to be limited by pain and decreased activity tolerance. No change in DC/DME recs at this time. PT will continue to follow.  Recommendations for follow up therapy are one component of a multi-disciplinary discharge planning process, led by the attending physician.  Recommendations may be updated based on patient status, additional functional criteria and insurance authorization.  Follow Up Recommendations  Can patient physically be transported by private vehicle: No    Assistance Recommended at Discharge Frequent or constant Supervision/Assistance  Patient can return home with the following A lot of help with walking and/or transfers;A lot of help with bathing/dressing/bathroom;Assistance with cooking/housework;Assist for transportation   Equipment Recommendations  None recommended by PT    Recommendations for Other Services       Precautions / Restrictions Precautions Precautions: Fall;Other (comment) Precaution Comments: reviewed use of pillow for bracing with movements/cough/sneeze Restrictions Weight Bearing Restrictions: No     Mobility  Bed Mobility Overal bed mobility: Needs Assistance Bed Mobility: Rolling, Sidelying to Sit Rolling: Mod assist Sidelying to sit: Mod assist       General bed mobility comments: Pillow on R side for comfort    Transfers Overall transfer level: Needs assistance Equipment used: Rolling walker (2  wheels) Transfers: Sit to/from Stand Sit to Stand: +2 physical assistance, Mod assist           General transfer comment: Cues for hand placement and leaning nose over toes. Pt continues to have heavy posterior lean upon standing.    Ambulation/Gait Ambulation/Gait assistance: Min assist, +2 safety/equipment (Chair follow) Gait Distance (Feet): 20 Feet Assistive device: Rolling walker (2 wheels) Gait Pattern/deviations: Step-through pattern, Step-to pattern, Decreased stride length, Shuffle, Leaning posteriorly, Narrow base of support Gait velocity: very slow     General Gait Details: Pt with a very slow step to pattern. Pt with increased time to follow commands. Chair follow for safety.   Stairs             Wheelchair Mobility    Modified Rankin (Stroke Patients Only)       Balance Overall balance assessment: Needs assistance Sitting-balance support: Feet supported, Single extremity supported Sitting balance-Leahy Scale: Poor Sitting balance - Comments: Posterior lean. Multiple cues to lean forward. Postural control: Posterior lean Standing balance support: Bilateral upper extremity supported Standing balance-Leahy Scale: Poor Standing balance comment: Reliant on RW                            Cognition Arousal/Alertness: Awake/alert Behavior During Therapy: Flat affect Overall Cognitive Status: History of cognitive impairments - at baseline                                 General Comments: Intermittently following simple commands with increased time. Slow speech.        Exercises      General Comments General comments (skin integrity, edema, etc.): VSS on RA.  Reinforced IS use.      Pertinent Vitals/Pain Pain Assessment Pain Assessment: Faces Faces Pain Scale: Hurts even more Pain Location: Rt flank/ ribs with movement. Pain Descriptors / Indicators: Grimacing, Guarding, Moaning Pain Intervention(s): Monitored during  session    Home Living                          Prior Function            PT Goals (current goals can now be found in the care plan section) Progress towards PT goals: Progressing toward goals    Frequency    Min 2X/week      PT Plan Current plan remains appropriate    Co-evaluation              AM-PAC PT "6 Clicks" Mobility   Outcome Measure  Help needed turning from your back to your side while in a flat bed without using bedrails?: A Lot Help needed moving from lying on your back to sitting on the side of a flat bed without using bedrails?: A Lot Help needed moving to and from a bed to a chair (including a wheelchair)?: A Lot Help needed standing up from a chair using your arms (e.g., wheelchair or bedside chair)?: A Lot Help needed to walk in hospital room?: A Lot Help needed climbing 3-5 steps with a railing? : Total 6 Click Score: 11    End of Session   Activity Tolerance: Patient tolerated treatment well Patient left: in chair;with call bell/phone within reach;with chair alarm set;with family/visitor present Nurse Communication: Mobility status PT Visit Diagnosis: Unsteadiness on feet (R26.81);Repeated falls (R29.6);Other abnormalities of gait and mobility (R26.89);Muscle weakness (generalized) (M62.81);History of falling (Z91.81);Difficulty in walking, not elsewhere classified (R26.2);Pain Pain - Right/Left: Right     Time: 1610-9604 PT Time Calculation (min) (ACUTE ONLY): 19 min  Charges:  $Gait Training: 8-22 mins                     Emily Escobar, PT, DPT Acute Rehab Services [phone]    Emily Escobar 06/01/2023, 11:41 AM

## 2023-06-01 NOTE — Progress Notes (Signed)
Pt vomited 10 cc of food after a coughing spell.

## 2023-06-02 MED ORDER — IPRATROPIUM-ALBUTEROL 0.5-2.5 (3) MG/3ML IN SOLN
3.0000 mL | Freq: Four times a day (QID) | RESPIRATORY_TRACT | Status: DC | PRN
  Administered 2023-06-02: 3 mL via RESPIRATORY_TRACT
  Filled 2023-06-02: qty 3

## 2023-06-02 MED ORDER — METHOCARBAMOL 500 MG PO TABS: 500.0000 mg | ORAL_TABLET | Freq: Three times a day (TID) | ORAL | Status: AC | PRN

## 2023-06-02 MED ORDER — LIDOCAINE 5 % EX PTCH: 1.0000 | MEDICATED_PATCH | CUTANEOUS | 0 refills | Status: AC

## 2023-06-02 MED ORDER — GUAIFENESIN-DM 100-10 MG/5ML PO SYRP: 10.0000 mL | ORAL_SOLUTION | ORAL | 0 refills | Status: AC | PRN

## 2023-06-02 NOTE — Discharge Summary (Signed)
Physician Discharge Summary  Patient ID: Emily Escobar MRN: [id number] DOB/AGE: 04-06-1936 88 y.o.  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 female with a hx of cognitive impairment, memory loss and gait abnormality with frequent falls    Patient was a level 5 caveat per EDP note with hx of unwitnessed fall with unclear circumstances. Patient did admit to head trauma, no LOC.  She complained of pain over her R ribs. She underwent workup that showed stable hemodynamics without tachycardia, hypotension or hypoxia on RA. Hgb 14.8. No AKI. Glucose 107. Na, K, Cl, Calcium wnl. CTH and CT C-spine with no acute injuries identified. CXR w/ R 7-9th rib fx's without PTX. Patient still in pain despite pain medication and trauma called for admission.    Patient is orientated to self only. She complains of R rib pain. No other complaints. She reports she remebers the fall but cannot tell me the circumstances. Doesn't think she hit her head but thinks she had LOC for an unknown period of time.    Patient follows with Margie Ege, NP of Neurology for his cognitive impairment and gait abnormality with  frequent falls. 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    Tobacco Use: None Alcohol Use: None Substance use: None  Employment: Retired Patient lives at [employer].  Walks at baseline with a walker.   Hospital Course: Patient was followed by therapies throughout admission who recommended SNF placement upon medical readiness for discharge. On 06/02/23 patient was medically stable for discharge to SNF.   I was not directly involved in care of this patient, information in summary taken from chart review.   Allergies as of 06/02/2023       Reactions   Codeine Other (See Comments)   Pt states she has tolerated norco in the past Allergy not listed on MAR    Fentanyl Other (See Comments)   Hallucinations   Morphine Other (See Comments)   Patient's son states pt feels "looping" with morphine Allergy not listed on MAR    Morphine And Codeine Other (See Comments)   Patient's son states pt feels "looping" with morphine Allergy not listed on MAR         Medication List     STOP taking these medications    clotrimazole-betamethasone cream Commonly known as: Lotrisone   menthol-zinc oxide powder       TAKE these medications    acetaminophen 500 MG tablet Commonly known as: TYLENOL Take 1,000 mg by mouth 3 (three) times daily as needed for moderate pain.   atenolol 50 MG tablet Commonly known as: TENORMIN Take 50 mg by mouth daily.  atorvastatin 20 MG tablet Commonly known as: LIPITOR Take 20 mg by mouth daily.   cholecalciferol 25 MCG (1000 UNIT) tablet Commonly known as: VITAMIN D3 Take 1,000 Units by mouth daily.   citalopram 10 MG tablet Commonly known as: CELEXA Take 10 mg by mouth daily.   fluticasone 50 MCG/ACT nasal spray Commonly known as: FLONASE Place 2 sprays into both nostrils daily.   guaiFENesin-dextromethorphan  100-10 MG/5ML syrup Commonly known as: ROBITUSSIN DM Take 10 mLs by mouth every 4 (four) hours as needed for cough (chest congestion).   lidocaine 5 % Commonly known as: LIDODERM Place 1 patch onto the skin daily. Remove & Discard patch within 12 hours or as directed by MD   methocarbamol 500 MG tablet Commonly known as: ROBAXIN Take 1 tablet (500 mg total) by mouth every 8 (eight) hours as needed for muscle spasms (pain from rib fractues).   mirtazapine 15 MG tablet Commonly known as: REMERON Take 15 mg by mouth at bedtime.   multivitamin with minerals Tabs tablet Take 1 tablet by mouth daily.   omeprazole 40 MG capsule Commonly known as: PRILOSEC Take 40 mg by mouth daily.   PRESERVISION AREDS 2 PO Take 1 tablet by mouth in the morning and at bedtime.          Follow-up Information     Glean Salvo, NP. Schedule an appointment as soon as possible for a visit.   Specialty: Neurology Why: As needed Contact information: [address] [place] [address] [phone]                 Signed: Juliet Rude , [facility] 06/02/2023, 3:01 PM Please see Amion for pager number during day hours 7:00am-4:30pm

## 2023-06-02 NOTE — Progress Notes (Signed)
[facility] Progress Note     Subjective: CC:  Resting comfortably. NAEO. Oriented to self only. She thinks her son is coming  Objective: Vital signs in last 24 hours: Temp:  [97.6 F (36.4 C)-98 F (36.7 C)] 97.6 F (36.4 C) (06/18 0846) Pulse Rate:  [65-71] 71 (06/18 0846) Resp:  [16-17] 16 (06/18 0514) BP: (139-189)/(53-83) 189/83 (06/18 0846) SpO2:  [93 %-96 %] 96 % (06/18 0846) Last BM Date : 05/28/23  Intake/Output from previous day: 06/17 0701 - 06/18 0700 In: -  Out: 260 [Urine:250; Emesis/NG output:10] Intake/Output this shift: No intake/output data recorded.  PE: Gen:  Alert, NAD Card:  Regular rate and rhythm, Pulm:  Normal effort ORA Skin: warm and dry Psych: oriented to person  Lab Results:  No results for input(s): "WBC", "HGB", "HCT", "PLT" in the last 72 hours.  BMET Recent Labs    05/31/23 0358  NA 138  K 3.9  CL 103  CO2 21*  GLUCOSE 97  BUN 24*  CREATININE 0.68  CALCIUM 9.0   PT/INR No results for input(s): "LABPROT", "INR" in the last 72 hours. CMP     Component Value Date/Time   NA 138 05/31/2023 0358   NA 145 (H) 04/02/2021 1136   K 3.9 05/31/2023 0358   CL 103 05/31/2023 0358   CO2 21 (L) 05/31/2023 0358   GLUCOSE 97 05/31/2023 0358   BUN 24 (H) 05/31/2023 0358   BUN 19 04/02/2021 1136   CREATININE 0.68 05/31/2023 0358   CALCIUM 9.0 05/31/2023 0358   PROT 6.4 (L) 08/20/2022 1835   PROT 6.4 04/02/2021 1136   ALBUMIN 4.0 08/20/2022 1835   ALBUMIN 4.3 04/02/2021 1136   AST 22 08/20/2022 1835   ALT 26 08/20/2022 1835   ALKPHOS 78 08/20/2022 1835   BILITOT 0.8 08/20/2022 1835   BILITOT 0.4 04/02/2021 1136   GFRNONAA >60 05/31/2023 0358   GFRAA >60 07/19/2016 0444   Lipase  No results found for: "LIPASE"     Studies/Results: No results found.  Anti-infectives: Anti-infectives (From admission, onward)    None      Assessment/Plan Fall 6/13 R 7-9th rib fx's - No PTX. Multimodal pain control.  Pulm toilet. Follow up CXR 6/14 negative for PTX Hx cognitive impairment and gait abnormality with frequent falls - follows with Margie Ege, NP of Neurology as outpatient FEN - Reg VTE - SCDs, Lovenox ID - None Foley - None  Home atenolol, celexa, remeron, statin, prilosec, and vitamins ordered.   Dispo - SNF, TOC working on placement, appreciate their care.  Delirium precautions  Has been hypertensive, monitor. May need another antihypertensive added.  LOS: 0 days   I reviewed nursing notes, last 24 h vitals and pain scores, last 48 h intake and output, last 24 h labs and trends, and last 24 h imaging results.  This care required moderate level of medical decision making.   Hosie Spangle, PA-C [facility] Please see Amion for pager number during day hours 7:00am-4:30pm

## 2023-06-02 NOTE — TOC Transition Note (Signed)
Transition of Care [facility]) - CM/SW Discharge Note   Patient Details  Name: Emily Escobar MRN: [id number] Date of Birth: 1935/12/27  [facility] CM/SW Contact:  Glennon Mac, RN Phone Number: 06/02/2023, 2:28pm  Clinical Narrative:    Patient medically stable for discharge to [facility] today; PASSR number has been received from [facility].  Patient's daughter notified, and she is agreeable to discharge plan.  Spoke with Star in admissions at [facility]; patient to discharge to room 207P at facility; bedside nurse will need to call report to [phone].  Discharge summary and transfer packet forwarded to facility.  PTAR notified for transport at 3:22 PM.    Final next level of care: Skilled Nursing Facility Barriers to Discharge: Barriers Resolved   Patient Goals and CMS Choice CMS Medicare.gov Compare Post Acute Care list provided to::  Donah Driver, daughter-in-law) Choice offered to / list presented to : [facility] POA / Guardian  Discharge Placement  [facility]                       Discharge Plan and Services Additional resources added to the After Visit Summary for     Discharge Planning Services: CM Consult Post Acute Care Choice: Skilled Nursing Facility                               Social Determinants of Health (SDOH) Interventions SDOH Screenings   Tobacco Use: Low Risk  (05/28/2023)     Readmission Risk Interventions     No data to display          Quintella Baton, RN, BSN  Trauma/Neuro ICU Case Manager [phone]

## 2023-06-02 NOTE — Progress Notes (Signed)
Report called to nurse at [facility]. Family at bedside aware.

## 2023-06-02 NOTE — NC FL2 (Addendum)
[place] MEDICAID FL2 LEVEL OF CARE FORM     IDENTIFICATION  Patient Name: Emily Escobar Birthdate: 1936-12-14 Sex: female Admission Date (Current Location): 05/28/2023  [facility] and [place] Number:  Producer, television/film/video and Address:  [facility], 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   Cognitive impairment 04/02/2021   Gait abnormality 04/02/2021   Slurred speech 04/02/2021   Retinal pigment epithelium serous detachment, 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   Angina pectoris (HCC) 09/04/2020   Dyspnea on exertion 09/04/2020   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 choroidal neovascularization (HCC) 08/30/2020   Intermediate stage nonexudative age-related macular degeneration of both eyes 08/30/2020   Closed left femoral fracture, initial encounter 07/17/2016   Essential hypertension 07/17/2016   Anxiety and depression 07/17/2016    Orientation RESPIRATION BLADDER Height & Weight     Self  Normal Incontinent, External catheter Weight:   Height:     BEHAVIORAL SYMPTOMS/MOOD NEUROLOGICAL BOWEL NUTRITION STATUS      Continent Diet (See dc summary)  AMBULATORY STATUS COMMUNICATION OF NEEDS Skin   Extensive Assist Verbally (Difficulty speaking) Normal                       Personal Care Assistance Level of Assistance  Bathing, Feeding, Dressing Bathing Assistance: Maximum assistance Feeding assistance: Limited  assistance Dressing Assistance: Maximum assistance     Functional Limitations Info  Sight, Hearing, Speech Sight Info: Adequate Hearing Info: Impaired Speech Info: Impaired (Difficulty speaking)    SPECIAL CARE FACTORS FREQUENCY  PT (By licensed PT), OT (By licensed OT)     PT Frequency: 5xweek OT Frequency: 5xweek            Contractures Contractures Info: Not present    Additional Factors Info                  Current Medications (06/02/2023):  This is the current hospital active medication list Current Facility-Administered Medications  Medication Dose Route Frequency Provider Last Rate Last Admin   acetaminophen (TYLENOL) tablet 1,000 mg  1,000 mg Oral Q6H Maczis, Elmer Sow, PA-C   1,000 mg at 06/01/23 1155   atenolol (TENORMIN) tablet 50 mg  50 mg Oral Daily Adam Phenix, PA-C   50 mg at 06/02/23 0955   atorvastatin (LIPITOR) tablet 20 mg  20 mg Oral Daily Adam Phenix, PA-C   20 mg at 06/02/23 8469   cholecalciferol (VITAMIN D3) 25 MCG (1000 UNIT) tablet 1,000 Units  1,000 Units Oral Daily Adam Phenix, PA-C   1,000 Units at 06/02/23 0955   citalopram (CELEXA) tablet 10 mg  10 mg Oral Daily Adam Phenix, PA-C   10 mg at 06/02/23 0955   docusate sodium (COLACE) capsule 100 mg  100 mg Oral BID Jacinto Halim, PA-C   100 mg at 06/02/23 0955   enoxaparin (LOVENOX) injection 30 mg  30 mg Subcutaneous Q12H Maczis, Elmer Sow,  PA-C   30 mg at 06/02/23 0954   feeding supplement (ENSURE ENLIVE / ENSURE PLUS) liquid 237 mL  237 mL Oral TID WC Adam Phenix, PA-C   237 mL at 06/02/23 0955   guaiFENesin-dextromethorphan (ROBITUSSIN DM) 100-10 MG/5ML syrup 10 mL  10 mL Oral Q4H PRN Diamantina Monks, MD   10 mL at 05/31/23 2351   hydrALAZINE (APRESOLINE) injection 5-20 mg  5-20 mg Intravenous Q6H PRN Adam Phenix, PA-C       lidocaine (LIDODERM) 5 % 1 patch  1 patch Transdermal Q24H Mannie Stabile, PA-C   1 patch at 06/01/23 1329    metoprolol tartrate (LOPRESSOR) injection 5 mg  5 mg Intravenous Q6H PRN Maczis, Elmer Sow, PA-C       mirtazapine (REMERON) tablet 15 mg  15 mg Oral QHS Adam Phenix, PA-C   15 mg at 06/01/23 2253   multivitamin with minerals tablet 1 tablet  1 tablet Oral Daily Adam Phenix, PA-C   1 tablet at 06/02/23 0955   ondansetron (ZOFRAN-ODT) disintegrating tablet 4 mg  4 mg Oral Q6H PRN Jacinto Halim, PA-C       Or   ondansetron [facility]) injection 4 mg  4 mg Intravenous Q6H PRN Maczis, Elmer Sow, PA-C       oxyCODONE (Oxy IR/ROXICODONE) immediate release tablet 2.5-5 mg  2.5-5 mg Oral Q4H PRN Jacinto Halim, PA-C   5 mg at 05/29/23 1518   pantoprazole (PROTONIX) EC tablet 40 mg  40 mg Oral Daily Adam Phenix, PA-C   40 mg at 06/02/23 0955   polyethylene glycol (MIRALAX / GLYCOLAX) packet 17 g  17 g Oral Daily PRN Jacinto Halim, PA-C         Discharge Medications: Please see discharge summary for a list of discharge medications.  Relevant Imaging Results:  Relevant Lab Results:   Additional Information [id number]  Glennon Mac, RN

## 2023-06-02 NOTE — TOC CM/SW Note (Addendum)
RE:  Emily Escobar    Date of Birth:  June 06, 1936 ___________  Date: 06/02/2023        To Whom It May Concern:  Please be advised that the above-named patient will require a short-term nursing home stay - anticipated 30 days or less for rehabilitation and strengthening.  The plan is for return home.         MD signature  ______________________________ Date     Quintella Baton, RN, BSN  Trauma/Neuro ICU Case Manager [phone]

## 2023-06-02 NOTE — Progress Notes (Signed)
Transport just picked up patient to take to [facility]. Belongings sent with patient.

## 2023-07-06 ENCOUNTER — Encounter (HOSPITAL_COMMUNITY): Payer: Self-pay

## 2023-07-06 ENCOUNTER — Emergency Department (HOSPITAL_COMMUNITY)
Admission: EM | Admit: 2023-07-06 | Discharge: 2023-07-07 | Disposition: A | Discharge status: Home / Self Care | Payer: Medicare HMO | Attending: Emergency Medicine | Admitting: Emergency Medicine

## 2023-07-06 ENCOUNTER — Emergency Department (HOSPITAL_COMMUNITY): Payer: Medicare HMO

## 2023-07-06 ENCOUNTER — Other Ambulatory Visit: Payer: Self-pay

## 2023-07-06 DIAGNOSIS — F0390 Unspecified dementia without behavioral disturbance: Secondary | ICD-10-CM | POA: Insufficient documentation

## 2023-07-06 DIAGNOSIS — S0993XA Unspecified injury of face, initial encounter: Secondary | ICD-10-CM | POA: Diagnosis present

## 2023-07-06 DIAGNOSIS — W19XXXA Unspecified fall, initial encounter: Secondary | ICD-10-CM | POA: Diagnosis not present

## 2023-07-06 DIAGNOSIS — M542 Cervicalgia: Secondary | ICD-10-CM | POA: Insufficient documentation

## 2023-07-06 DIAGNOSIS — I10 Essential (primary) hypertension: Secondary | ICD-10-CM | POA: Diagnosis not present

## 2023-07-06 DIAGNOSIS — R1012 Left upper quadrant pain: Secondary | ICD-10-CM | POA: Insufficient documentation

## 2023-07-06 DIAGNOSIS — S01511A Laceration without foreign body of lip, initial encounter: Secondary | ICD-10-CM | POA: Diagnosis not present

## 2023-07-06 DIAGNOSIS — G20A1 Parkinson's disease without dyskinesia, without mention of fluctuations: Secondary | ICD-10-CM | POA: Diagnosis not present

## 2023-07-06 DIAGNOSIS — R0789 Other chest pain: Secondary | ICD-10-CM | POA: Insufficient documentation

## 2023-07-06 HISTORY — DX: Parkinsonism, unspecified: G20.C

## 2023-07-06 LAB — BASIC METABOLIC PANEL
Anion gap: 8 (ref 5–15)
BUN: 23 mg/dL (ref 8–23)
CO2: 24 mmol/L (ref 22–32)
Calcium: 8.8 mg/dL — ABNORMAL LOW (ref 8.9–10.3)
Chloride: 107 mmol/L (ref 98–111)
Creatinine, Ser: 0.61 mg/dL (ref 0.44–1.00)
GFR, Estimated: >60 mL/min (ref >60)
Glucose, Bld: 110 mg/dL — ABNORMAL HIGH (ref 70–99)
Potassium: 3.1 mmol/L — ABNORMAL LOW (ref 3.5–5.1)
Sodium: 139 mmol/L (ref 135–145)

## 2023-07-06 LAB — CBC
HCT: 42 % (ref 36.0–46.0)
Hemoglobin: 13.9 g/dL (ref 12.0–15.0)
MCH: 30.3 pg (ref 26.0–34.0)
MCHC: 33.1 g/dL (ref 30.0–36.0)
MCV: 91.5 fL (ref 80.0–100.0)
Platelets: 148 10*3/uL — ABNORMAL LOW (ref 150–400)
RBC: 4.59 MIL/uL (ref 3.87–5.11)
RDW: 13.2 % (ref 11.5–15.5)
WBC: 7.8 10*3/uL (ref 4.0–10.5)
nRBC: 0 % (ref 0.0–0.2)

## 2023-07-06 MED ORDER — IOHEXOL 300 MG/ML  SOLN
100.0000 mL | Freq: Once | INTRAMUSCULAR | Status: AC | PRN
  Administered 2023-07-06: 100 mL via INTRAVENOUS

## 2023-07-06 NOTE — ED Provider Notes (Incomplete)
[place] EMERGENCY DEPARTMENT AT [facility] Provider Note   CSN: [id number] Arrival date & time: 07/06/23  2044     History {Add pertinent medical, surgical, social history, OB history to HPI:1} Chief Complaint  Patient presents with  . Fall    Emily Escobar is a 87 y.o. female.   Fall  Patient presents with fall.  Reported history of some dementia.  Found sitting on the floor.  Small laceration left upper lip.  Patient cannot really provide much history.  Grabbing right neck.    Past Medical History:  Diagnosis Date  . Anxiety and depression   . Closed left femoral fracture (HCC)   . History of vertebral fracture    from fall in September 2016  . Hypertension   . Insomnia   . Macular degeneration   . Memory loss   . Parkinsonism   . Solitary kidney     Home Medications Prior to Admission medications   Medication Sig Start Date End Date Taking? Authorizing Provider  acetaminophen (TYLENOL) 500 MG tablet Take 1,000 mg by mouth 3 (three) times daily as needed for moderate pain.    [provider]  atenolol (TENORMIN) 50 MG tablet Take 50 mg by mouth daily. 07/02/16   [provider]  atorvastatin (LIPITOR) 20 MG tablet Take 20 mg by mouth daily. 08/16/20   [provider]  cholecalciferol (VITAMIN D3) 25 MCG (1000 UNIT) tablet Take 1,000 Units by mouth daily.    [provider]  citalopram (CELEXA) 10 MG tablet Take 10 mg by mouth daily. 09/15/22   [provider]  fluticasone (FLONASE) 50 MCG/ACT nasal spray Place 2 sprays into both nostrils daily.    [provider]  guaiFENesin-dextromethorphan (ROBITUSSIN DM) 100-10 MG/5ML syrup Take 10 mLs by mouth every 4 (four) hours as needed for cough (chest congestion). 06/02/23   Juliet Rude, PA-C  lidocaine (LIDODERM) 5 % Place 1 patch onto the skin daily. Remove & Discard patch within 12 hours or as directed by MD 06/02/23   Juliet Rude, PA-C   methocarbamol (ROBAXIN) 500 MG tablet Take 1 tablet (500 mg total) by mouth every 8 (eight) hours as needed for muscle spasms (pain from rib fractues). 06/02/23   Juliet Rude, PA-C  mirtazapine (REMERON) 15 MG tablet Take 15 mg by mouth at bedtime. 05/29/16   [provider]  Multiple Vitamin (MULTIVITAMIN WITH MINERALS) TABS tablet Take 1 tablet by mouth daily.    [provider]  Multiple Vitamins-Minerals (PRESERVISION AREDS 2 PO) Take 1 tablet by mouth in the morning and at bedtime.    [provider]  omeprazole (PRILOSEC) 40 MG capsule Take 40 mg by mouth daily.    [provider]      Allergies    Codeine, Fentanyl, Morphine, and Morphine and codeine    Review of Systems   Review of Systems  Physical Exam Updated Vital Signs BP (!) 148/64   Pulse (!) 57   Temp 98.4 F (36.9 C) (Oral)   Resp 17   SpO2 95%  Physical Exam Vitals reviewed.  HENT:     Head: Normocephalic.     Comments: Small laceration to left upper lip.  Does not involve vermilion border. Eyes:     Pupils: Pupils are equal, round, and reactive to light.  Neck:     Comments: Right-sided neck.  Mild midline tenderness.  Still moving head around freely. Pulmonary:     Comments: Some  left-sided chest tenderness.  No crepitance or deformity.  Also some left upper quadrant abdominal tenderness Chest:     Chest wall: Tenderness present.  Abdominal:     Tenderness: There is abdominal tenderness.  Musculoskeletal:        General: No tenderness.  Skin:    General: Skin is warm.  Neurological:     Mental Status: She is alert. Mental status is at baseline.     ED Results / Procedures / Treatments   Labs (all labs ordered are listed, but only abnormal results are displayed) Labs Reviewed  CBC - Abnormal; Notable for the following components:      Result Value   Platelets 148 (*)    All other components within normal limits  BASIC METABOLIC PANEL - Abnormal; Notable  for the following components:   Potassium 3.1 (*)    Glucose, Bld 110 (*)    Calcium 8.8 (*)    All other components within normal limits    EKG None  Radiology No results found.  Procedures Procedures  {Document cardiac monitor, telemetry assessment procedure when appropriate:1}  Medications Ordered in ED Medications  iohexol (OMNIPAQUE) 300 MG/ML solution 100 mL (100 mLs Intravenous Contrast Given 07/06/23 2238)    ED Course/ Medical Decision Making/ A&P   {   Click here for ABCD2, HEART and other calculatorsREFRESH Note before signing :1}                          Medical Decision Making Amount and/or Complexity of Data Reviewed Labs: ordered. Radiology: ordered.  Risk Prescription drug management.   Patient with fall.  Cannot provide much history.  Found sitting on ground but does have abrasion to face.  Grabbing neck.  Will get head CT cervical spine CT and chest abdomen pelvis. no lower extremity tenderness Reviewed recent discharge note.  Had rib fractures roughly a month ago and was admitted to the hospital.  {Document critical care time when appropriate:1} {Document review of labs and clinical decision tools ie heart score, Chads2Vasc2 etc:1}  {Document your independent review of radiology images, and any outside records:1} {Document your discussion with family members, caretakers, and with consultants:1} {Document social determinants of health affecting pt's care:1} {Document your decision making why or why not admission, treatments were needed:1} Final Clinical Impression(s) / ED Diagnoses Final diagnoses:  Fall, initial encounter  Lip laceration, initial encounter    Rx / DC Orders ED Discharge Orders     None

## 2023-07-06 NOTE — ED Triage Notes (Signed)
BIBA from [facility] for unwitnessed fall. Pt was found sitting upright on the floor, laceration on upper lip. Pt c/o left side head, ear pain,  and left knee. BLE hearing aids in place. 140/88 BP 68 HR 95% room air

## 2023-07-06 NOTE — ED Notes (Signed)
Patient transported to CT 

## 2023-07-06 NOTE — ED Provider Notes (Signed)
[name] Provider Note   CSN: [id number] Arrival date & time: 07/06/23  2044     History {Add pertinent medical, surgical, social history, OB history to HPI:1} Chief Complaint  Patient presents with   Emily Escobar is a 87 y.o. female.   Fall  Patient presents with fall.  Reported history of some dementia.  Found sitting on the floor.  Small laceration left upper lip.  Patient cannot really provide much history.  Grabbing right neck.    Past Medical History:  Diagnosis Date   Anxiety and depression    Closed left femoral fracture (HCC)    History of vertebral fracture    from fall in September 2016   Hypertension    Insomnia    Macular degeneration    Memory loss    Parkinsonism    Solitary kidney     Home Medications Prior to Admission medications   Medication Sig Start Date End Date Taking? Authorizing Provider  acetaminophen (TYLENOL) 500 MG tablet Take 1,000 mg by mouth 3 (three) times daily as needed for moderate pain.    [provider]  atenolol (TENORMIN) 50 MG tablet Take 50 mg by mouth daily. 07/02/16   [provider]  atorvastatin (LIPITOR) 20 MG tablet Take 20 mg by mouth daily. 08/16/20   [provider]  cholecalciferol (VITAMIN D3) 25 MCG (1000 UNIT) tablet Take 1,000 Units by mouth daily.    [provider]  citalopram (CELEXA) 10 MG tablet Take 10 mg by mouth daily. 09/15/22   [provider]  fluticasone (FLONASE) 50 MCG/ACT nasal spray Place 2 sprays into both nostrils daily.    [provider]  guaiFENesin-dextromethorphan (ROBITUSSIN DM) 100-10 MG/5ML syrup Take 10 mLs by mouth every 4 (four) hours as needed for cough (chest congestion). 06/02/23   Juliet Rude, PA-C  lidocaine (LIDODERM) 5 % Place 1 patch onto the skin daily. Remove & Discard patch within 12 hours or as directed by MD 06/02/23   Juliet Rude, PA-C  methocarbamol  (ROBAXIN) 500 MG tablet Take 1 tablet (500 mg total) by mouth every 8 (eight) hours as needed for muscle spasms (pain from rib fractues). 06/02/23   Juliet Rude, PA-C  mirtazapine (REMERON) 15 MG tablet Take 15 mg by mouth at bedtime. 05/29/16   [provider]  Multiple Vitamin (MULTIVITAMIN WITH MINERALS) TABS tablet Take 1 tablet by mouth daily.    [provider]  Multiple Vitamins-Minerals (PRESERVISION AREDS 2 PO) Take 1 tablet by mouth in the morning and at bedtime.    [provider]  omeprazole (PRILOSEC) 40 MG capsule Take 40 mg by mouth daily.    [provider]      Allergies    Codeine, Fentanyl, Morphine, and Morphine and codeine    Review of Systems   Review of Systems  Physical Exam Updated Vital Signs BP (!) 148/64   Pulse (!) 57   Temp 98.4 F (36.9 C) (Oral)   Resp 17   SpO2 95%  Physical Exam Vitals reviewed.  HENT:     Head: Normocephalic.     Comments: Small laceration to left upper lip.  Does not involve vermilion border. Eyes:     Pupils: Pupils are equal, round, and reactive to light.  Neck:     Comments: Right-sided neck.  Mild midline tenderness.  Still moving head around freely. Pulmonary:     Comments: Some  left-sided chest tenderness.  No crepitance or deformity.  Also some left upper quadrant abdominal tenderness Chest:     Chest wall: Tenderness present.  Abdominal:     Tenderness: There is abdominal tenderness.  Musculoskeletal:        General: No tenderness.  Skin:    General: Skin is warm.  Neurological:     Mental Status: She is alert. Mental status is at baseline.     ED Results / Procedures / Treatments   Labs (all labs ordered are listed, but only abnormal results are displayed) Labs Reviewed  CBC - Abnormal; Notable for the following components:      Result Value   Platelets 148 (*)    All other components within normal limits  BASIC METABOLIC PANEL - Abnormal; Notable for the following  components:   Potassium 3.1 (*)    Glucose, Bld 110 (*)    Calcium 8.8 (*)    All other components within normal limits    EKG None  Radiology No results found.  Procedures Procedures  {Document cardiac monitor, telemetry assessment procedure when appropriate:1}  Medications Ordered in ED Medications  iohexol (OMNIPAQUE) 300 MG/ML solution 100 mL (100 mLs Intravenous Contrast Given 07/06/23 2238)    ED Course/ Medical Decision Making/ A&P   {   Click here for ABCD2, HEART and other calculatorsREFRESH Note before signing :1}                          Medical Decision Making Amount and/or Complexity of Data Reviewed Labs: ordered. Radiology: ordered.  Risk Prescription drug management.   Patient with fall.  Cannot provide much history.  Found sitting on ground but does have abrasion to face.  Grabbing neck.  Will get head CT cervical spine CT and chest abdomen pelvis. no lower extremity tenderness Reviewed recent discharge note.  Had rib fractures roughly a month ago and was admitted to the hospital.  {Document critical care time when appropriate:1} {Document review of labs and clinical decision tools ie heart score, Chads2Vasc2 etc:1}  {Document your independent review of radiology images, and any outside records:1} {Document your discussion with family members, caretakers, and with consultants:1} {Document social determinants of health affecting pt's care:1} {Document your decision making why or why not admission, treatments were needed:1} Final Clinical Impression(s) / ED Diagnoses Final diagnoses:  None    Rx / DC Orders ED Discharge Orders     None

## 2023-07-07 NOTE — Discharge Instructions (Signed)
It looks like there are old rib fractures I do not think they are new but watch for worsening shortness of breath or fevers.  There also was a lung nodule that will need a new CT scan in about 6 months.  Follow-up with your doctor as needed.

## 2023-07-14 ENCOUNTER — Emergency Department (HOSPITAL_COMMUNITY)
Admission: EM | Admit: 2023-07-14 | Discharge: 2023-07-14 | Disposition: A | Discharge status: Home / Self Care | Payer: Medicare HMO | Source: Home / Self Care | Attending: Emergency Medicine | Admitting: Emergency Medicine

## 2023-07-14 ENCOUNTER — Emergency Department (HOSPITAL_COMMUNITY): Payer: Medicare HMO

## 2023-07-14 ENCOUNTER — Encounter (HOSPITAL_COMMUNITY): Payer: Self-pay

## 2023-07-14 DIAGNOSIS — Y92002 Bathroom of unspecified non-institutional (private) residence single-family (private) house as the place of occurrence of the external cause: Secondary | ICD-10-CM | POA: Diagnosis not present

## 2023-07-14 DIAGNOSIS — W182XXA Fall in (into) shower or empty bathtub, initial encounter: Secondary | ICD-10-CM | POA: Diagnosis not present

## 2023-07-14 DIAGNOSIS — S0081XA Abrasion of other part of head, initial encounter: Secondary | ICD-10-CM | POA: Diagnosis not present

## 2023-07-14 DIAGNOSIS — G20C Parkinsonism, unspecified: Secondary | ICD-10-CM | POA: Diagnosis not present

## 2023-07-14 DIAGNOSIS — S0990XA Unspecified injury of head, initial encounter: Secondary | ICD-10-CM | POA: Diagnosis present

## 2023-07-14 DIAGNOSIS — W19XXXA Unspecified fall, initial encounter: Secondary | ICD-10-CM

## 2023-07-14 LAB — COMPREHENSIVE METABOLIC PANEL
ALT: 18 U/L (ref 0–44)
AST: 18 U/L (ref 15–41)
Albumin: 3.8 g/dL (ref 3.5–5.0)
Alkaline Phosphatase: 72 U/L (ref 38–126)
Anion gap: 8 (ref 5–15)
BUN: 20 mg/dL (ref 8–23)
CO2: 27 mmol/L (ref 22–32)
Calcium: 9.2 mg/dL (ref 8.9–10.3)
Chloride: 104 mmol/L (ref 98–111)
Creatinine, Ser: 0.77 mg/dL (ref 0.44–1.00)
GFR, Estimated: >60 mL/min (ref >60)
Glucose, Bld: 117 mg/dL — ABNORMAL HIGH (ref 70–99)
Potassium: 3.5 mmol/L (ref 3.5–5.1)
Sodium: 139 mmol/L (ref 135–145)
Total Bilirubin: 0.7 mg/dL (ref 0.3–1.2)
Total Protein: 6.3 g/dL — ABNORMAL LOW (ref 6.5–8.1)

## 2023-07-14 LAB — CBC
HCT: 43.2 % (ref 36.0–46.0)
Hemoglobin: 14 g/dL (ref 12.0–15.0)
MCH: 30.4 pg (ref 26.0–34.0)
MCHC: 32.4 g/dL (ref 30.0–36.0)
MCV: 93.7 fL (ref 80.0–100.0)
Platelets: 166 10*3/uL (ref 150–400)
RBC: 4.61 MIL/uL (ref 3.87–5.11)
RDW: 13.2 % (ref 11.5–15.5)
WBC: 8.2 10*3/uL (ref 4.0–10.5)
nRBC: 0 % (ref 0.0–0.2)

## 2023-07-14 MED ORDER — ACETAMINOPHEN 325 MG PO TABS
650.0000 mg | ORAL_TABLET | Freq: Once | ORAL | Status: AC
  Administered 2023-07-14: 650 mg via ORAL
  Filled 2023-07-14: qty 2

## 2023-07-14 NOTE — ED Notes (Signed)
Attempted to call facility to give report. Phone continued to ring and no one picked up.

## 2023-07-14 NOTE — ED Provider Notes (Signed)
[place] EMERGENCY DEPARTMENT AT [facility] Provider Note   CSN: [id number] Arrival date & time: 07/14/23  2001     History  Chief Complaint  Patient presents with   Marletta Lor    Emily Escobar is a 87 y.o. female.  Patient here after fall.  Sounds like she fell transferring from her toilet to her wheelchair.  She is mostly asking for help for these things but sounds like she tried to do it on her own and fell.  She was on the ground for maybe an hour.  She is not complaining of any pain.  She has a Parkinson type process that leads her with some cognitive deficits, speech difficulty, gait difficulty.  Not on any blood thinners.  She denies any extremity pain.  Vital signs unremarkable with EMS.  The history is provided by the patient.       Home Medications Prior to Admission medications   Medication Sig Start Date End Date Taking? Authorizing Provider  acetaminophen (TYLENOL) 500 MG tablet Take 1,000 mg by mouth 3 (three) times daily as needed for moderate pain.    [provider]  atenolol (TENORMIN) 50 MG tablet Take 50 mg by mouth daily. 07/02/16   [provider]  atorvastatin (LIPITOR) 20 MG tablet Take 20 mg by mouth daily. 08/16/20   [provider]  cholecalciferol (VITAMIN D3) 25 MCG (1000 UNIT) tablet Take 1,000 Units by mouth daily.    [provider]  citalopram (CELEXA) 10 MG tablet Take 10 mg by mouth daily. 09/15/22   [provider]  fluticasone (FLONASE) 50 MCG/ACT nasal spray Place 2 sprays into both nostrils daily.    [provider]  guaiFENesin-dextromethorphan (ROBITUSSIN DM) 100-10 MG/5ML syrup Take 10 mLs by mouth every 4 (four) hours as needed for cough (chest congestion). 06/02/23   Juliet Rude, PA-C  lidocaine (LIDODERM) 5 % Place 1 patch onto the skin daily. Remove & Discard patch within 12 hours or as directed by MD 06/02/23   Juliet Rude, PA-C  methocarbamol (ROBAXIN) 500 MG tablet Take  1 tablet (500 mg total) by mouth every 8 (eight) hours as needed for muscle spasms (pain from rib fractues). 06/02/23   Juliet Rude, PA-C  mirtazapine (REMERON) 15 MG tablet Take 15 mg by mouth at bedtime. 05/29/16   [provider]  Multiple Vitamin (MULTIVITAMIN WITH MINERALS) TABS tablet Take 1 tablet by mouth daily.    [provider]  Multiple Vitamins-Minerals (PRESERVISION AREDS 2 PO) Take 1 tablet by mouth in the morning and at bedtime.    [provider]  omeprazole (PRILOSEC) 40 MG capsule Take 40 mg by mouth daily.    [provider]      Allergies    Codeine, Fentanyl, Morphine, and Morphine and codeine    Review of Systems   Review of Systems  Physical Exam Updated Vital Signs BP (!) 167/72   Pulse 62   Temp 98.3 F (36.8 C)   Resp 19   SpO2 98%  Physical Exam Vitals and nursing note reviewed.  Constitutional:      General: She is not in acute distress.    Appearance: She is well-developed. She is not ill-appearing.  HENT:     Head:     Comments: Abrasion to forehead    Nose: Nose normal.     Mouth/Throat:     Mouth: Mucous membranes are moist.  Eyes:     Extraocular Movements: Extraocular  movements intact.     Conjunctiva/sclera: Conjunctivae normal.     Pupils: Pupils are equal, round, and reactive to light.  Cardiovascular:     Rate and Rhythm: Normal rate and regular rhythm.     Pulses: Normal pulses.     Heart sounds: Normal heart sounds. No murmur heard. Pulmonary:     Effort: Pulmonary effort is normal. No respiratory distress.     Breath sounds: Normal breath sounds.  Abdominal:     Palpations: Abdomen is soft.     Tenderness: There is no abdominal tenderness.  Musculoskeletal:        General: No swelling or tenderness.     Cervical back: Normal range of motion and neck supple. No tenderness.     Comments: No midline spinal tenderness  Skin:    General: Skin is warm and dry.     Capillary Refill: Capillary  refill takes less than 2 seconds.  Neurological:     General: No focal deficit present.     Mental Status: She is alert. Mental status is at baseline.  Psychiatric:        Mood and Affect: Mood normal.     ED Results / Procedures / Treatments   Labs (all labs ordered are listed, but only abnormal results are displayed) Labs Reviewed  COMPREHENSIVE METABOLIC PANEL - Abnormal; Notable for the following components:      Result Value   Glucose, Bld 117 (*)    Total Protein 6.3 (*)    All other components within normal limits  CBC    EKG None  Radiology CT Head Wo Contrast  Result Date: 07/14/2023 CLINICAL DATA:  Head trauma, minor (Age >= 65y); Neck trauma (Age >= 65y) EXAM: CT HEAD WITHOUT CONTRAST CT CERVICAL SPINE WITHOUT CONTRAST TECHNIQUE: Multidetector CT imaging of the head and cervical spine was performed following the standard protocol without intravenous contrast. Multiplanar CT image reconstructions of the cervical spine were also generated. RADIATION DOSE REDUCTION: This exam was performed according to the departmental dose-optimization program which includes automated exposure control, adjustment of the mA and/or kV according to patient size and/or use of iterative reconstruction technique. COMPARISON:  CT head and C-spine 07/06/2023 FINDINGS: CT HEAD FINDINGS Brain: Cerebral ventricle sizes are concordant with the degree of cerebral volume loss. Patchy and confluent areas of decreased attenuation are noted throughout the deep and periventricular white matter of the cerebral hemispheres bilaterally, compatible with chronic microvascular ischemic disease. No evidence of large-territorial acute infarction. No parenchymal hemorrhage. No mass lesion. No extra-axial collection. No mass effect or midline shift. No hydrocephalus. Basilar cisterns are patent. Vascular: No hyperdense vessel. Atherosclerotic calcifications are present within the cavernous internal carotid and vertebral  arteries. Skull: No acute fracture or focal lesion. Sinuses/Orbits: Paranasal sinuses and mastoid air cells are clear. The orbits are unremarkable. Other: None. CT CERVICAL SPINE FINDINGS Alignment: Normal. Skull base and vertebrae: Multilevel moderate degenerative changes spine. No associated severe osseous neural foraminal or central canal stenosis. No acute fracture. No aggressive appearing focal osseous lesion or focal pathologic process. Soft tissues and spinal canal: No prevertebral fluid or swelling. No visible canal hematoma. Upper chest: Unremarkable. Other: None. IMPRESSION: 1. No acute intracranial abnormality. 2. No acute displaced fracture or traumatic listhesis of the cervical spine. Electronically Signed   By: Tish Frederickson M.D.   On: 07/14/2023 21:00   CT Cervical Spine Wo Contrast  Result Date: 07/14/2023 CLINICAL DATA:  Head trauma, minor (Age >= 65y); Neck trauma (Age >=  65y) EXAM: CT HEAD WITHOUT CONTRAST CT CERVICAL SPINE WITHOUT CONTRAST TECHNIQUE: Multidetector CT imaging of the head and cervical spine was performed following the standard protocol without intravenous contrast. Multiplanar CT image reconstructions of the cervical spine were also generated. RADIATION DOSE REDUCTION: This exam was performed according to the departmental dose-optimization program which includes automated exposure control, adjustment of the mA and/or kV according to patient size and/or use of iterative reconstruction technique. COMPARISON:  CT head and C-spine 07/06/2023 FINDINGS: CT HEAD FINDINGS Brain: Cerebral ventricle sizes are concordant with the degree of cerebral volume loss. Patchy and confluent areas of decreased attenuation are noted throughout the deep and periventricular white matter of the cerebral hemispheres bilaterally, compatible with chronic microvascular ischemic disease. No evidence of large-territorial acute infarction. No parenchymal hemorrhage. No mass lesion. No extra-axial  collection. No mass effect or midline shift. No hydrocephalus. Basilar cisterns are patent. Vascular: No hyperdense vessel. Atherosclerotic calcifications are present within the cavernous internal carotid and vertebral arteries. Skull: No acute fracture or focal lesion. Sinuses/Orbits: Paranasal sinuses and mastoid air cells are clear. The orbits are unremarkable. Other: None. CT CERVICAL SPINE FINDINGS Alignment: Normal. Skull base and vertebrae: Multilevel moderate degenerative changes spine. No associated severe osseous neural foraminal or central canal stenosis. No acute fracture. No aggressive appearing focal osseous lesion or focal pathologic process. Soft tissues and spinal canal: No prevertebral fluid or swelling. No visible canal hematoma. Upper chest: Unremarkable. Other: None. IMPRESSION: 1. No acute intracranial abnormality. 2. No acute displaced fracture or traumatic listhesis of the cervical spine. Electronically Signed   By: Tish Frederickson M.D.   On: 07/14/2023 21:00   DG Pelvis Portable  Result Date: 07/14/2023 CLINICAL DATA:  Status post fall. EXAM: PORTABLE PELVIS 1-2 VIEWS COMPARISON:  March 15, 2023 FINDINGS: There is no evidence of pelvic fracture or diastasis. An intact total left hip replacement is noted. No pelvic bone lesions are seen. IMPRESSION: 1. No acute osseous abnormality. 2. Intact total left hip replacement. Electronically Signed   By: Aram Candela M.D.   On: 07/14/2023 20:34   DG Chest Portable 1 View  Result Date: 07/14/2023 CLINICAL DATA:  Status post fall. EXAM: PORTABLE CHEST 1 VIEW COMPARISON:  None Available. FINDINGS: The cardiac silhouette is mildly enlarged and unchanged in size. Low lung volumes are noted with mild, diffuse, chronic appearing increased interstitial lung markings. There is no evidence of focal consolidation, pleural effusion or pneumothorax. Six, seventh, eighth and ninth right rib fractures are seen. These are present on the prior study. An  intact right shoulder replacement is noted. IMPRESSION: 1. Low lung volumes with mild, chronic appearing increased interstitial lung markings. 2. Multiple right rib fractures. Electronically Signed   By: Aram Candela M.D.   On: 07/14/2023 20:33    Procedures Procedures    Medications Ordered in ED Medications  acetaminophen (TYLENOL) tablet 650 mg (650 mg Oral Given 07/14/23 2136)    ED Course/ Medical Decision Making/ A&P                                 Medical Decision Making Amount and/or Complexity of Data Reviewed Labs: ordered. Radiology: ordered.  Risk OTC drugs.   Emily Escobar is here after unwitnessed fall at her skilled nursing facility.  Patient has history of Parkinson's type disorder.  Is not on blood thinners.  Sounds like she fell try to get back from her toilet bowl to  her wheelchair.  Was on the floor for maybe an hour.  She had a fall yesterday as well but was not evaluated.  Has abrasion to her forehead from that.  She is at her baseline.  I talked with family who was in the room as well who thinks that she is at her baseline.  She is not complaining of any extremity pain.  She had a CT scan of her head, neck and x-ray of her chest and pelvis that were unremarkable for traumatic processes per my review and interpretation but as well as radiology reports.  Lab work per my review and interpretation shows no significant anemia or electrolyte abnormality or kidney injury.  Overall she stable for discharge back to facility.  This chart was dictated using voice recognition software.  Despite best efforts to proofread,  errors can occur which can change the documentation meaning.         Final Clinical Impression(s) / ED Diagnoses Final diagnoses:  Fall, initial encounter    Rx / DC Orders ED Discharge Orders     None         Virgina Norfolk, DO 07/14/23 2137

## 2023-07-14 NOTE — ED Triage Notes (Signed)
Pt is coming in by medic from [facility] [place] for a fall that was unwitnessed. Fell in bathroom getting into wheelchair. Pt has a Hx of falls. Pt is complaining of occipital pain, no big hematomas noted. Abrasion on forehead is from a fall yesterday. NOT on blood thinners. In c-collar per protocol of medic. Pt also has hx of stroke and that is why she is at the rehab facility. No other injuries noted with inspection but medic reports she was found on her right side when they arrived. Could've been on floor for around 1hr.   Medic Vitals   180 palp 60hr 97% 117bgl 18rr

## 2023-07-14 NOTE — ED Notes (Signed)
Attempted to call report. Staff answered. Report was given to the nurse.  Family states that they will take Pt back and facility was informed of same and agreed to take the Pt.

## 2023-07-29 ENCOUNTER — Other Ambulatory Visit: Payer: Self-pay

## 2023-07-29 ENCOUNTER — Emergency Department (HOSPITAL_BASED_OUTPATIENT_CLINIC_OR_DEPARTMENT_OTHER)
Admission: EM | Admit: 2023-07-29 | Discharge: 2023-07-29 | Disposition: A | Discharge status: Home / Self Care | Payer: Medicare HMO | Attending: Emergency Medicine | Admitting: Emergency Medicine

## 2023-07-29 ENCOUNTER — Encounter (HOSPITAL_BASED_OUTPATIENT_CLINIC_OR_DEPARTMENT_OTHER): Payer: Self-pay

## 2023-07-29 DIAGNOSIS — S0083XA Contusion of other part of head, initial encounter: Secondary | ICD-10-CM | POA: Diagnosis present

## 2023-07-29 DIAGNOSIS — R296 Repeated falls: Secondary | ICD-10-CM

## 2023-07-29 DIAGNOSIS — W07XXXA Fall from chair, initial encounter: Secondary | ICD-10-CM | POA: Diagnosis not present

## 2023-07-29 MED ORDER — ACETAMINOPHEN 500 MG PO TABS
1000.0000 mg | ORAL_TABLET | Freq: Once | ORAL | Status: AC
  Administered 2023-07-29: 1000 mg via ORAL
  Filled 2023-07-29: qty 2

## 2023-07-29 NOTE — Discharge Instructions (Signed)
1.  Apply antibiotic ointment to any facial abrasions. 2.  You may apply cool packs to areas of swelling.  For the face, frozen vegetables work well.  Sure you wrapped them with a washcloth to protect your skin. 3.  You may take extra strength Tylenol every 6 hours as needed for pain. 4.  Have a recheck with your doctor as needed.

## 2023-07-29 NOTE — ED Provider Notes (Signed)
[place] EMERGENCY DEPARTMENT AT [facility] Provider Note   CSN: [id number] Arrival date & time: 07/29/23  1627     History  Chief Complaint  Patient presents with   Marletta Lor    Emily Escobar is a 87 y.o. female.  HPI Has history of frequent falls.  She lives at nursing facility.  Reportedly she was "reaching for something" and lost her balance and fell forward from her chair onto her face.  Patient has abrasions on her forehead and swelling of the upper lip.  Our report from the facility is that the fall on her face was from yesterday.  The patient does not have any complaints.  She denies she is experiencing any significant facial pain.  Patient speech is very slow and halting.  This is reportedly normal for her.  Patient also endorses awareness of her halting speech pattern and endorses it to be normal.  I have spoken with the patient's son on the phone and reportedly her daughter-in-law will be here very shortly.  This speech pattern is not unusual for the patient.  He reports that patient's been diagnosed as having something similar to Parkinson's but not Parkinson's but also something similar to Alzheimer's but not Alzheimer's.  She has had some degenerative neurologic type condition without specific diagnosis but he describes extensive workup.    Home Medications Prior to Admission medications   Medication Sig Start Date End Date Taking? Authorizing Provider  acetaminophen (TYLENOL) 500 MG tablet Take 1,000 mg by mouth 3 (three) times daily as needed for moderate pain.    [provider]  atenolol (TENORMIN) 50 MG tablet Take 50 mg by mouth daily. 07/02/16   [provider]  atorvastatin (LIPITOR) 20 MG tablet Take 20 mg by mouth daily. 08/16/20   [provider]  cholecalciferol (VITAMIN D3) 25 MCG (1000 UNIT) tablet Take 1,000 Units by mouth daily.    [provider]  citalopram (CELEXA) 10 MG tablet Take 10 mg by mouth daily. 09/15/22    [provider]  fluticasone (FLONASE) 50 MCG/ACT nasal spray Place 2 sprays into both nostrils daily.    [provider]  guaiFENesin-dextromethorphan (ROBITUSSIN DM) 100-10 MG/5ML syrup Take 10 mLs by mouth every 4 (four) hours as needed for cough (chest congestion). 06/02/23   Juliet Rude, PA-C  lidocaine (LIDODERM) 5 % Place 1 patch onto the skin daily. Remove & Discard patch within 12 hours or as directed by MD 06/02/23   Juliet Rude, PA-C  methocarbamol (ROBAXIN) 500 MG tablet Take 1 tablet (500 mg total) by mouth every 8 (eight) hours as needed for muscle spasms (pain from rib fractues). 06/02/23   Juliet Rude, PA-C  mirtazapine (REMERON) 15 MG tablet Take 15 mg by mouth at bedtime. 05/29/16   [provider]  Multiple Vitamin (MULTIVITAMIN WITH MINERALS) TABS tablet Take 1 tablet by mouth daily.    [provider]  Multiple Vitamins-Minerals (PRESERVISION AREDS 2 PO) Take 1 tablet by mouth in the morning and at bedtime.    [provider]  omeprazole (PRILOSEC) 40 MG capsule Take 40 mg by mouth daily.    [provider]      Allergies    Codeine, Fentanyl, Morphine, and Morphine and codeine    Review of Systems   Review of Systems  Physical Exam Updated Vital Signs BP (!) 177/66 (BP Location: Right Arm)   Pulse 60   Temp 98 F (36.7 C) (Oral)   Resp  19   SpO2 94%  Physical Exam Constitutional:      Comments: Patient is awake and alert.  Speech is slightly halting but content is situationally appropriate.  No respiratory distress at rest.  HENT:     Head:     Comments: Lip contusion on the left.  Mild to moderate swelling.  No laceration.  Superficial abrasions on the face.  See attached images.    Mouth/Throat:     Mouth: Mucous membranes are moist.     Pharynx: Oropharynx is clear.     Comments: Front dentition is in good condition and intact.  No loose teeth to palpation. Eyes:     Extraocular Movements:  Extraocular movements intact.     Pupils: Pupils are equal, round, and reactive to light.  Neck:     Comments: Denies C-spine tenderness to palpation.  Cardiovascular:     Rate and Rhythm: Normal rate and regular rhythm.  Pulmonary:     Effort: Pulmonary effort is normal.     Breath sounds: Normal breath sounds.  Chest:     Chest wall: No tenderness.  Abdominal:     General: There is no distension.     Palpations: Abdomen is soft.     Tenderness: There is no abdominal tenderness.  Musculoskeletal:        General: Normal range of motion.     Comments: Very superficial abrasion over the right knee.  This appears to be several days old.  No effusions of the knees.  Trace edema of the lower legs.  No deformities.  Neurological:     Comments: Patient is awake.  Her speech is very slow and halting but the content is relatively normal.  It seems situationally oriented and appropriate.  She will follow commands to help sit forward using both arms to gripped the bed rails.  She does not seem to have great strength but also no focal deficits.  Patient will also cooperate to temporarily hold each lower extremity off of the bed.  Everything does seem fairly stiff and slowed but no localizing deficit.     ED Results / Procedures / Treatments   Labs (all labs ordered are listed, but only abnormal results are displayed) Labs Reviewed - No data to display  EKG None  Radiology No results found.  Procedures Procedures    Medications Ordered in ED Medications  acetaminophen (TYLENOL) tablet 1,000 mg (has no administration in time range)    ED Course/ Medical Decision Making/ A&P                                 Medical Decision Making  Patient is sent for evaluation for fall at SNF.  At this time patient does not have pain complaints.  I spoken with the patient's son and daughter-in-law who has come to be in the department.  All are in agreement including the patient that she is at  baseline.  Patient does not Dors complaints and patient daughter-in-law advises that she does not have complaints of pain then, that should be fairly accurate.  Patient does have minor facial abrasions and swelling but no evident fractures.  Patient is not anticoagulated.  She does have frequent falls.  At this time I do not think further imaging is indicated.  Patient is given extra strength Tylenol and recommended to use this at home as needed for pain as well as cool packs for  swelling.  Return precautions include follow-up with PCP and home care.        Final Clinical Impression(s) / ED Diagnoses Final diagnoses:  Frequent falls  Facial contusion, initial encounter    Rx / DC Orders ED Discharge Orders     None         Arby Barrette, MD 07/29/23 1724

## 2023-07-29 NOTE — ED Triage Notes (Signed)
She states that as she was "reaching for something" she lost her balance and fell forward from a chair onto her face. She has abrasions at forehead area and swelling of her upper lip. She is awake and has halting speech, which is not new. She c/o face and neck discomfort. Her note from the facility indicates the fall onto her face was from yesterday.

## 2023-08-02 ENCOUNTER — Emergency Department (HOSPITAL_BASED_OUTPATIENT_CLINIC_OR_DEPARTMENT_OTHER): Payer: Medicare HMO

## 2023-08-02 ENCOUNTER — Emergency Department (HOSPITAL_BASED_OUTPATIENT_CLINIC_OR_DEPARTMENT_OTHER)
Admission: EM | Admit: 2023-08-02 | Discharge: 2023-08-02 | Disposition: A | Discharge status: Home / Self Care | Payer: Medicare HMO | Source: Home / Self Care | Attending: Emergency Medicine | Admitting: Emergency Medicine

## 2023-08-02 ENCOUNTER — Encounter (HOSPITAL_BASED_OUTPATIENT_CLINIC_OR_DEPARTMENT_OTHER): Payer: Self-pay

## 2023-08-02 ENCOUNTER — Emergency Department (HOSPITAL_BASED_OUTPATIENT_CLINIC_OR_DEPARTMENT_OTHER): Payer: Medicare HMO | Admitting: Radiology

## 2023-08-02 ENCOUNTER — Other Ambulatory Visit: Payer: Self-pay

## 2023-08-02 DIAGNOSIS — W050XXA Fall from non-moving wheelchair, initial encounter: Secondary | ICD-10-CM | POA: Insufficient documentation

## 2023-08-02 DIAGNOSIS — W19XXXA Unspecified fall, initial encounter: Secondary | ICD-10-CM

## 2023-08-02 DIAGNOSIS — I10 Essential (primary) hypertension: Secondary | ICD-10-CM | POA: Insufficient documentation

## 2023-08-02 DIAGNOSIS — S0990XA Unspecified injury of head, initial encounter: Secondary | ICD-10-CM | POA: Diagnosis present

## 2023-08-02 DIAGNOSIS — M25551 Pain in right hip: Secondary | ICD-10-CM | POA: Insufficient documentation

## 2023-08-02 DIAGNOSIS — F0390 Unspecified dementia without behavioral disturbance: Secondary | ICD-10-CM | POA: Diagnosis not present

## 2023-08-02 DIAGNOSIS — M79601 Pain in right arm: Secondary | ICD-10-CM | POA: Diagnosis not present

## 2023-08-02 DIAGNOSIS — Z79899 Other long term (current) drug therapy: Secondary | ICD-10-CM | POA: Insufficient documentation

## 2023-08-02 DIAGNOSIS — G20A1 Parkinson's disease without dyskinesia, without mention of fluctuations: Secondary | ICD-10-CM | POA: Insufficient documentation

## 2023-08-02 DIAGNOSIS — S0083XA Contusion of other part of head, initial encounter: Secondary | ICD-10-CM | POA: Insufficient documentation

## 2023-08-02 MED ORDER — SODIUM CHLORIDE 0.9 % IV BOLUS: 500.0000 mL | Freq: Once | INTRAVENOUS | Status: DC

## 2023-08-02 NOTE — ED Triage Notes (Signed)
[name] by [facility] who states facility advised that pt had a forward fall from her wheelchair last night. Pt w/ hx of dementia, speech difficult to understand at baseline per EMS. Pt not on blood thinners. Unknown LOC. Facility unable to reach pt's POA. Pt c/o pain to her face w/ multiple abrasions/bruises noted.

## 2023-08-02 NOTE — ED Provider Notes (Signed)
[place] EMERGENCY DEPARTMENT AT [facility] Provider Note   CSN: [id number] Arrival date & time: 08/02/23  1039     History  Chief Complaint  Patient presents with   Marletta Lor    Emily Escobar is a 87 y.o. female.   Fall   87 year old female presents emergency department from [facility] after a fall.  Patient states that she was in a wheelchair and bent forward to pick up an object when she fell and hit her face.  Currently complaining of facial pain/headache, right arm pain, right hip pain as well as right knee pain.  Patient states she did not lose consciousness and does not take blood thinners..  Was on the ground for a matter of minutes or so before she was assisted back up.  Denies any visual disturbance or baseline, weakness/sensory deficits in upper or lower extremities from baseline, worsening speech from baseline.  Denies any chest pain, shortness of breath abdominal pain, nausea, vomiting.  Past medical history significant for slurred speech, dementia, macular degeneration, gait abnormality, parkinsonism, hypertension  Home Medications Prior to Admission medications   Medication Sig Start Date End Date Taking? Authorizing Provider  acetaminophen (TYLENOL) 500 MG tablet Take 1,000 mg by mouth 3 (three) times daily as needed for moderate pain.    [provider]  atenolol (TENORMIN) 50 MG tablet Take 50 mg by mouth daily. 07/02/16   [provider]  atorvastatin (LIPITOR) 20 MG tablet Take 20 mg by mouth daily. 08/16/20   [provider]  cholecalciferol (VITAMIN D3) 25 MCG (1000 UNIT) tablet Take 1,000 Units by mouth daily.    [provider]  citalopram (CELEXA) 10 MG tablet Take 10 mg by mouth daily. 09/15/22   [provider]  fluticasone (FLONASE) 50 MCG/ACT nasal spray Place 2 sprays into both nostrils daily.    [provider]  guaiFENesin-dextromethorphan (ROBITUSSIN DM) 100-10 MG/5ML syrup Take 10  mLs by mouth every 4 (four) hours as needed for cough (chest congestion). 06/02/23   Juliet Rude, PA-C  lidocaine (LIDODERM) 5 % Place 1 patch onto the skin daily. Remove & Discard patch within 12 hours or as directed by MD 06/02/23   Juliet Rude, PA-C  methocarbamol (ROBAXIN) 500 MG tablet Take 1 tablet (500 mg total) by mouth every 8 (eight) hours as needed for muscle spasms (pain from rib fractues). 06/02/23   Juliet Rude, PA-C  mirtazapine (REMERON) 15 MG tablet Take 15 mg by mouth at bedtime. 05/29/16   [provider]  Multiple Vitamin (MULTIVITAMIN WITH MINERALS) TABS tablet Take 1 tablet by mouth daily.    [provider]  Multiple Vitamins-Minerals (PRESERVISION AREDS 2 PO) Take 1 tablet by mouth in the morning and at bedtime.    [provider]  omeprazole (PRILOSEC) 40 MG capsule Take 40 mg by mouth daily.    [provider]      Allergies    Codeine, Fentanyl, Morphine, and Morphine and codeine    Review of Systems   Review of Systems  All other systems reviewed and are negative.   Physical Exam Updated Vital Signs BP (!) 167/73 (BP Location: Left Arm)   Pulse 66   Temp 98.1 F (36.7 C) (Oral)   Resp 16   SpO2 97%  Physical Exam Vitals and nursing note reviewed.  Constitutional:      General: She is not in acute distress.    Appearance: She is well-developed.  HENT:  Head: Normocephalic.     Comments: Patient with yellow appearing bruising on the face.  No evidence of septal hematoma. Eyes:     Conjunctiva/sclera: Conjunctivae normal.  Cardiovascular:     Rate and Rhythm: Normal rate and regular rhythm.     Heart sounds: No murmur heard. Pulmonary:     Effort: Pulmonary effort is normal. No respiratory distress.     Breath sounds: Normal breath sounds.  Abdominal:     Palpations: Abdomen is soft.     Tenderness: There is no abdominal tenderness.  Musculoskeletal:        General: No swelling.     Cervical back:  Neck supple.     Right lower leg: No edema.     Left lower leg: No edema.     Comments: No midline tenderness of cervical, thoracic, lumbar spine with no obvious step-off or deformity noted.  No chest wall tenderness to palpation.  Patient with right shoulder as well as right mid humeral tenderness to palpation with limited range of motion of right shoulder secondary to pain.  Patient with symmetric grip strength as well as elbow flexion/extension.  Radial pulses 2+ bilaterally.  Patient with tenderness to palpation of right hip as well as right knee but otherwise, without tenderness to palpation of upper or lower extremities otherwise and aforementioned.  Pedal pulses 2+ bilaterally.  Skin:    General: Skin is warm and dry.     Capillary Refill: Capillary refill takes less than 2 seconds.  Neurological:     Mental Status: She is alert.     Comments: Patient seems to be alert and oriented to self and event.  Speech is slurred but patient states this is her baseline.  Strength symmetric in upper/lower extremities   Sensation intact in upper/lower extremities   Gait not assessed. CN I not tested  CN II not tested  CN III, IV, VI PERRLA and EOMs intact bilaterally  CN V Intact sensation to sharp and light touch to the face  CN VII facial movements symmetric  CN VIII not tested  CN IX, X no uvula deviation, symmetric rise of soft palate  CN XI symmetric SCM and trapezius strength bilaterally  CN XII Midline tongue protrusion, symmetric L/R movements     Psychiatric:        Mood and Affect: Mood normal.     ED Results / Procedures / Treatments   Labs (all labs ordered are listed, but only abnormal results are displayed) Labs Reviewed - No data to display  EKG None  Radiology DG Knee Complete 4 Views Right  Result Date: 08/02/2023 CLINICAL DATA:  Patient fell out of wheelchair.  Knee pain. EXAM: RIGHT KNEE - COMPLETE 4+ VIEW COMPARISON:  None Available. FINDINGS: No evidence of  fracture, dislocation, or joint effusion. Mild tricompartmental degenerative spurring. No focal bone abnormality. Soft tissues are unremarkable. IMPRESSION: Mild tricompartmental degenerative spurring without fracture or dislocation. Electronically Signed   By: Kennith Center M.D.   On: 08/02/2023 12:24   DG Humerus Right  Result Date: 08/02/2023 CLINICAL DATA:  Patient fell out of wheelchair.  Pain. EXAM: RIGHT HUMERUS - 2+ VIEW COMPARISON:  None Available. FINDINGS: Status post right shoulder replacement. No evidence for periprosthetic fracture. No evidence for distal humerus fracture no worrisome lytic or sclerotic osseous abnormality. IMPRESSION: Negative. Electronically Signed   By: Kennith Center M.D.   On: 08/02/2023 12:23   DG Elbow Complete Right  Result Date: 08/02/2023 CLINICAL DATA:  Right  elbow pain EXAM: RIGHT ELBOW - COMPLETE 3+ VIEW COMPARISON:  None Available. FINDINGS: No acute fracture or dislocation. No aggressive osseous lesion. Normal alignment. Soft tissue are unremarkable. No radiopaque foreign body or soft tissue emphysema. IMPRESSION: 1. No acute osseous injury of the right elbow. Electronically Signed   By: Elige Ko M.D.   On: 08/02/2023 12:22   DG Chest 1 View  Result Date: 08/02/2023 CLINICAL DATA:  Patient fell out of wheelchair last night.  Pain. EXAM: CHEST  1 VIEW COMPARISON:  07/14/2023 FINDINGS: Low volume film. The cardio pericardial silhouette is enlarged. There is pulmonary vascular congestion without overt pulmonary edema. No acute bony abnormality. Right shoulder replacement. IMPRESSION: Low volume film with pulmonary vascular congestion. Electronically Signed   By: Kennith Center M.D.   On: 08/02/2023 12:22   DG Hip Unilat With Pelvis 2-3 Views Right  Result Date: 08/02/2023 CLINICAL DATA:  Hip pain status post fall EXAM: DG HIP (WITH OR WITHOUT PELVIS) 2-3V RIGHT COMPARISON:  None Available. FINDINGS: No acute fracture or dislocation. No aggressive osseous  lesion. Normal alignment. Generalized osteopenia. Left total hip arthroplasty without hardware failure or complication. Lower lumbar spine spondylosis. Soft tissue are unremarkable. No radiopaque foreign body or soft tissue emphysema. IMPRESSION: 1. No acute osseous injury of the right hip. If there is further clinical concern, recommend a CT of the right hip. Electronically Signed   By: Elige Ko M.D.   On: 08/02/2023 12:21   CT Head Wo Contrast  Result Date: 08/02/2023 CLINICAL DATA:  Head trauma, minor (Age >= 65y); Facial trauma, blunt; Neck trauma (Age >= 65y). EXAM: CT HEAD WITHOUT CONTRAST CT MAXILLOFACIAL WITHOUT CONTRAST CT CERVICAL SPINE WITHOUT CONTRAST TECHNIQUE: Multidetector CT imaging of the head, cervical spine, and maxillofacial structures were performed using the standard protocol without intravenous contrast. Multiplanar CT image reconstructions of the cervical spine and maxillofacial structures were also generated. RADIATION DOSE REDUCTION: This exam was performed according to the departmental dose-optimization program which includes automated exposure control, adjustment of the mA and/or kV according to patient size and/or use of iterative reconstruction technique. COMPARISON:  CT head and cervical spine 07/14/2023. CT face 07/06/2023. FINDINGS: CT HEAD FINDINGS Brain: No acute hemorrhage. Unchanged chronic small-vessel disease. Cortical gray-white differentiation is otherwise preserved. Prominence of the ventricles and sulci within expected range for age. No hydrocephalus or extra-axial collection. No mass effect or midline shift. Vascular: No hyperdense vessel or unexpected calcification. Skull: No calvarial fracture or suspicious bone lesion. Skull base is unremarkable. Other: None. CT MAXILLOFACIAL FINDINGS Osseous: No fracture or mandibular dislocation. No destructive process. Orbits: Negative. No traumatic or inflammatory finding. Sinuses: Clear. Soft tissues: Unremarkable. CT  CERVICAL SPINE FINDINGS Alignment: Normal. Skull base and vertebrae: No acute fracture. Normal craniocervical junction. No suspicious bone lesions. Soft tissues and spinal canal: No prevertebral fluid or swelling. No visible canal hematoma. Disc levels: Mild cervical spondylosis without high-grade spinal canal stenosis. Upper chest: No acute findings. Other: None. IMPRESSION: 1. No acute intracranial abnormality. 2. No acute facial fracture. 3. No acute cervical spine fracture or traumatic listhesis. Electronically Signed   By: Orvan Falconer M.D.   On: 08/02/2023 12:15   CT Cervical Spine Wo Contrast  Result Date: 08/02/2023 CLINICAL DATA:  Head trauma, minor (Age >= 65y); Facial trauma, blunt; Neck trauma (Age >= 65y). EXAM: CT HEAD WITHOUT CONTRAST CT MAXILLOFACIAL WITHOUT CONTRAST CT CERVICAL SPINE WITHOUT CONTRAST TECHNIQUE: Multidetector CT imaging of the head, cervical spine, and maxillofacial structures were performed using  the standard protocol without intravenous contrast. Multiplanar CT image reconstructions of the cervical spine and maxillofacial structures were also generated. RADIATION DOSE REDUCTION: This exam was performed according to the departmental dose-optimization program which includes automated exposure control, adjustment of the mA and/or kV according to patient size and/or use of iterative reconstruction technique. COMPARISON:  CT head and cervical spine 07/14/2023. CT face 07/06/2023. FINDINGS: CT HEAD FINDINGS Brain: No acute hemorrhage. Unchanged chronic small-vessel disease. Cortical gray-white differentiation is otherwise preserved. Prominence of the ventricles and sulci within expected range for age. No hydrocephalus or extra-axial collection. No mass effect or midline shift. Vascular: No hyperdense vessel or unexpected calcification. Skull: No calvarial fracture or suspicious bone lesion. Skull base is unremarkable. Other: None. CT MAXILLOFACIAL FINDINGS Osseous: No fracture or  mandibular dislocation. No destructive process. Orbits: Negative. No traumatic or inflammatory finding. Sinuses: Clear. Soft tissues: Unremarkable. CT CERVICAL SPINE FINDINGS Alignment: Normal. Skull base and vertebrae: No acute fracture. Normal craniocervical junction. No suspicious bone lesions. Soft tissues and spinal canal: No prevertebral fluid or swelling. No visible canal hematoma. Disc levels: Mild cervical spondylosis without high-grade spinal canal stenosis. Upper chest: No acute findings. Other: None. IMPRESSION: 1. No acute intracranial abnormality. 2. No acute facial fracture. 3. No acute cervical spine fracture or traumatic listhesis. Electronically Signed   By: Orvan Falconer M.D.   On: 08/02/2023 12:15   CT Maxillofacial WO CM  Result Date: 08/02/2023 CLINICAL DATA:  Head trauma, minor (Age >= 65y); Facial trauma, blunt; Neck trauma (Age >= 65y). EXAM: CT HEAD WITHOUT CONTRAST CT MAXILLOFACIAL WITHOUT CONTRAST CT CERVICAL SPINE WITHOUT CONTRAST TECHNIQUE: Multidetector CT imaging of the head, cervical spine, and maxillofacial structures were performed using the standard protocol without intravenous contrast. Multiplanar CT image reconstructions of the cervical spine and maxillofacial structures were also generated. RADIATION DOSE REDUCTION: This exam was performed according to the departmental dose-optimization program which includes automated exposure control, adjustment of the mA and/or kV according to patient size and/or use of iterative reconstruction technique. COMPARISON:  CT head and cervical spine 07/14/2023. CT face 07/06/2023. FINDINGS: CT HEAD FINDINGS Brain: No acute hemorrhage. Unchanged chronic small-vessel disease. Cortical gray-white differentiation is otherwise preserved. Prominence of the ventricles and sulci within expected range for age. No hydrocephalus or extra-axial collection. No mass effect or midline shift. Vascular: No hyperdense vessel or unexpected calcification.  Skull: No calvarial fracture or suspicious bone lesion. Skull base is unremarkable. Other: None. CT MAXILLOFACIAL FINDINGS Osseous: No fracture or mandibular dislocation. No destructive process. Orbits: Negative. No traumatic or inflammatory finding. Sinuses: Clear. Soft tissues: Unremarkable. CT CERVICAL SPINE FINDINGS Alignment: Normal. Skull base and vertebrae: No acute fracture. Normal craniocervical junction. No suspicious bone lesions. Soft tissues and spinal canal: No prevertebral fluid or swelling. No visible canal hematoma. Disc levels: Mild cervical spondylosis without high-grade spinal canal stenosis. Upper chest: No acute findings. Other: None. IMPRESSION: 1. No acute intracranial abnormality. 2. No acute facial fracture. 3. No acute cervical spine fracture or traumatic listhesis. Electronically Signed   By: Orvan Falconer M.D.   On: 08/02/2023 12:15    Procedures Procedures    Medications Ordered in ED Medications - No data to display  ED Course/ Medical Decision Making/ A&P Clinical Course as of 08/02/23 1539  Sun Aug 02, 2023  1155 Attempted to consult patient's living facility x 2 with no success.  Awaiting callback. [CR]  1206 Attempted to contact son, daughter-in-law as well as daughter without any success.  Attempted twice for each  contact. [CR]  1212 Staff at [facility] assisted living facility called back.  States that patient had unwitnessed fall sometime overnight last night where she hit the right side of her face and complaining of right-sided shoulder pain of which are new.  Patient reportedly got back into bed by herself as she is able to ambulate by herself but is not supposed to.  Was not on the ground very long per staff. [CR]    Clinical Course User Index [CR] Peter Garter, PA                                 Medical Decision Making Amount and/or Complexity of Data Reviewed Radiology: ordered.   This patient presents to the ED for concern of fall, this  involves an extensive number of treatment options, and is a complaint that carries with it a high risk of complications and morbidity.  The differential diagnosis includes CVA, pneumothorax, fracture, strain/pain, dislocations, solid organ damage, ligamentous/tendinous injury, rhabdomyolysis   Co morbidities that complicate the patient evaluation  See HPI   Additional history obtained:  Additional history obtained from EMR External records from outside source obtained and reviewed including hospital records   Lab Tests:  N/a   Imaging Studies ordered:  I ordered imaging studies including  Right knee x-ray: Tricompartmental OA with no acute fracture or dislocation. Right humerus x-ray: No acute abnormalities Right elbow x-ray: No acute abnormalities Chest x-ray: Low volume film Pelvis with right hip x-ray: No acute osseous abnormality CT head/C-spine/maxillofacial: No acute abnormality I independently visualized and interpreted imaging  I agree with the radiologist interpretation   Cardiac Monitoring: / EKG:  The patient was maintained on a cardiac monitor.  I personally viewed and interpreted the cardiac monitored which showed an underlying rhythm of: Sinus rhythm   Consultations Obtained:  I requested consultation with attending physician Dr. Eloise Harman who independently evaluated the patient was agreement treatment plan going forward.  Problem List / ED Course / Critical interventions / Medication management  Fall Reevaluation of the patient showed that the patient stayed the same I have reviewed the patients home medicines and have made adjustments as needed   Social Determinants of Health:  Denies tobacco, licit drug use   Test / Admission - Considered:  Fall Vitals signs significant for hypertension blood pressure 167/73. Otherwise within normal range and stable throughout visit. Imaging studies significant for: See above 87 year old female presents  emergency department after mechanical fall.  Patient reportedly was on the ground only a matter of minutes before she independently got herself back up on the bed.  Complaining of right-sided hip pain, right arm pain as well as right-sided hip/knee pain.  Patient's workup today overall reassuring.  Patient without evidence of any acute traumatic injury on imaging study was obtained.  Patient without any neurologic deficits on exam.  Will recommend treatment of pain at home with Tylenol and follow-up with primary care in the outpatient setting.  Will recommend fall prevention in the assisted living facility as well as advised patient to not ambulate without assistance as is supposed to be her baseline.  Treatment plan discussed at length with patient and she acknowledged understanding was agreeable to said plan. Worrisome signs and symptoms were discussed with the patient, and the patient acknowledged understanding to return to the ED if noticed. Patient was stable upon discharge.  Final Clinical Impression(s) / ED Diagnoses Final diagnoses:  Fall, initial encounter    Rx / DC Orders ED Discharge Orders     None         Peter Garter, [place] 08/02/23 1539    Rondel Baton, MD 08/03/23 [phone]

## 2023-08-02 NOTE — ED Notes (Signed)
Attempted to call pt's facility [facility] [place]) to give report but received no answer. Will call back shortly.

## 2023-08-02 NOTE — ED Notes (Signed)
[facility] called for transport to [facility] at [place].

## 2023-08-02 NOTE — Discharge Instructions (Signed)
As discussed, workup today overall reassuring.  Imaging studies were negative for any fracture, dislocation, brain bleed or other abnormality.  Recommend taking Tylenol for pain.  Follow the nursing facility's guidelines regarding fall prevention and please do not ambulate by yourself as you are high fall risk.  Please do not hesitate to return to emergency department if there are worrisome signs and symptoms we discussed become apparent.

## 2023-08-28 ENCOUNTER — Emergency Department (HOSPITAL_BASED_OUTPATIENT_CLINIC_OR_DEPARTMENT_OTHER): Payer: Medicare HMO

## 2023-08-28 ENCOUNTER — Other Ambulatory Visit: Payer: Self-pay

## 2023-08-28 ENCOUNTER — Emergency Department (HOSPITAL_BASED_OUTPATIENT_CLINIC_OR_DEPARTMENT_OTHER): Payer: Medicare HMO | Admitting: Radiology

## 2023-08-28 ENCOUNTER — Emergency Department (HOSPITAL_BASED_OUTPATIENT_CLINIC_OR_DEPARTMENT_OTHER)
Admission: EM | Admit: 2023-08-28 | Discharge: 2023-08-28 | Disposition: A | Discharge status: Home / Self Care | Payer: Medicare HMO | Attending: Emergency Medicine | Admitting: Emergency Medicine

## 2023-08-28 DIAGNOSIS — S2241XA Multiple fractures of ribs, right side, initial encounter for closed fracture: Secondary | ICD-10-CM

## 2023-08-28 DIAGNOSIS — N649 Disorder of breast, unspecified: Secondary | ICD-10-CM

## 2023-08-28 DIAGNOSIS — S2242XA Multiple fractures of ribs, left side, initial encounter for closed fracture: Secondary | ICD-10-CM | POA: Diagnosis not present

## 2023-08-28 DIAGNOSIS — S0083XA Contusion of other part of head, initial encounter: Secondary | ICD-10-CM | POA: Insufficient documentation

## 2023-08-28 DIAGNOSIS — W19XXXA Unspecified fall, initial encounter: Secondary | ICD-10-CM | POA: Diagnosis not present

## 2023-08-28 DIAGNOSIS — S299XXA Unspecified injury of thorax, initial encounter: Secondary | ICD-10-CM | POA: Diagnosis present

## 2023-08-28 DIAGNOSIS — N63 Unspecified lump in unspecified breast: Secondary | ICD-10-CM | POA: Diagnosis not present

## 2023-08-28 DIAGNOSIS — S0990XA Unspecified injury of head, initial encounter: Secondary | ICD-10-CM

## 2023-08-28 LAB — CBC WITH DIFFERENTIAL/PLATELET
Abs Immature Granulocytes: 0.04 10*3/uL (ref 0.00–0.07)
Basophils Absolute: 0 10*3/uL (ref 0.0–0.1)
Basophils Relative: 0 %
Eosinophils Absolute: 0 10*3/uL (ref 0.0–0.5)
Eosinophils Relative: 0 %
HCT: 39.6 % (ref 36.0–46.0)
Hemoglobin: 13.2 g/dL (ref 12.0–15.0)
Immature Granulocytes: 0 %
Lymphocytes Relative: 6 %
Lymphs Abs: 0.6 10*3/uL — ABNORMAL LOW (ref 0.7–4.0)
MCH: 30.6 pg (ref 26.0–34.0)
MCHC: 33.3 g/dL (ref 30.0–36.0)
MCV: 91.7 fL (ref 80.0–100.0)
Monocytes Absolute: 0.8 10*3/uL (ref 0.1–1.0)
Monocytes Relative: 8 %
Neutro Abs: 8 10*3/uL — ABNORMAL HIGH (ref 1.7–7.7)
Neutrophils Relative %: 86 %
Platelets: 160 10*3/uL (ref 150–400)
RBC: 4.32 MIL/uL (ref 3.87–5.11)
RDW: 13.8 % (ref 11.5–15.5)
WBC: 9.5 10*3/uL (ref 4.0–10.5)
nRBC: 0 % (ref 0.0–0.2)

## 2023-08-28 LAB — BASIC METABOLIC PANEL
Anion gap: 8 (ref 5–15)
BUN: 18 mg/dL (ref 8–23)
CO2: 29 mmol/L (ref 22–32)
Calcium: 8.4 mg/dL — ABNORMAL LOW (ref 8.9–10.3)
Chloride: 108 mmol/L (ref 98–111)
Creatinine, Ser: 0.59 mg/dL (ref 0.44–1.00)
GFR, Estimated: >60 mL/min (ref >60)
Glucose, Bld: 105 mg/dL — ABNORMAL HIGH (ref 70–99)
Potassium: 3.2 mmol/L — ABNORMAL LOW (ref 3.5–5.1)
Sodium: 145 mmol/L (ref 135–145)

## 2023-08-28 MED ORDER — IOHEXOL 300 MG/ML  SOLN
75.0000 mL | Freq: Once | INTRAMUSCULAR | Status: AC | PRN
  Administered 2023-08-28: 75 mL via INTRAVENOUS

## 2023-08-28 NOTE — ED Notes (Signed)
[facility] called for transport to [facility] [place] [address] RM# [id number])

## 2023-08-28 NOTE — ED Notes (Signed)
Transferred to [facility] stretcher via sheet lift. Secured via straps x3 and rails x2. Leave department without incident. Dallie Piles- receives report.

## 2023-08-28 NOTE — Discharge Instructions (Signed)
You were seen in the emergency department for evaluation of injuries from a fall.  You had a CAT scan of your head and neck that did not show any acute findings.  You have multiple rib fractures on the right side some old and some possibly new.  Your oxygen level was stable.  Tylenol for pain.  Ice to areas that are sore.  Follow-up with your regular doctor.  Return to the emergency department if any worsening or concerning symptoms.

## 2023-08-28 NOTE — ED Triage Notes (Signed)
Pt arrived via [facility] from ALF for fall. Per EMS pt fell in room, suspected to be unwitnessed and mechanical, in wheelchair upon EMS arrival. Pt hit head, denies LOC however pt has Hx dementia, at baseline, alert to self with abnormal speech  No thinners  Abrasion R forehead, Abrasion nose Swelling and bruising R wrist and hand  Abrasion to R shoulder Bruising to chest  Alert to person with confusion on arrival to ED, denies pain.  EMS VS  BP 174/66 HR 75 SpO2 97% CBG 121 Temp 99.10F

## 2023-08-28 NOTE — ED Notes (Signed)
Pt daughter updated via telephone. Awaiting rest of test results. Pt stable at this time.

## 2023-08-28 NOTE — ED Provider Notes (Signed)
EMERGENCY DEPARTMENT AT [facility] Provider Note   CSN: [id number] Arrival date & time: 08/28/23  0865     History  No chief complaint on file.   Emily Escobar is a 87 y.o. female.  Level 5 caveat secondary to altered mental status.  She is brought in by ambulance from her facility after unwitnessed fall.  Staff found her in a wheelchair but with some possibly new bruising abrasions on her head.  Patient denies fall and denies any complaints.  She is a poor historian though.  The history is provided by the patient and the EMS personnel.  Fall       Home Medications Prior to Admission medications   Medication Sig Start Date End Date Taking? Authorizing Provider  acetaminophen (TYLENOL) 500 MG tablet Take 1,000 mg by mouth 3 (three) times daily as needed for moderate pain.    [provider]  atenolol (TENORMIN) 50 MG tablet Take 50 mg by mouth daily. 07/02/16   [provider]  atorvastatin (LIPITOR) 20 MG tablet Take 20 mg by mouth daily. 08/16/20   [provider]  cholecalciferol (VITAMIN D3) 25 MCG (1000 UNIT) tablet Take 1,000 Units by mouth daily.    [provider]  citalopram (CELEXA) 10 MG tablet Take 10 mg by mouth daily. 09/15/22   [provider]  fluticasone (FLONASE) 50 MCG/ACT nasal spray Place 2 sprays into both nostrils daily.    [provider]  guaiFENesin-dextromethorphan (ROBITUSSIN DM) 100-10 MG/5ML syrup Take 10 mLs by mouth every 4 (four) hours as needed for cough (chest congestion). 06/02/23   Juliet Rude, PA-C  lidocaine (LIDODERM) 5 % Place 1 patch onto the skin daily. Remove & Discard patch within 12 hours or as directed by MD 06/02/23   Juliet Rude, PA-C  methocarbamol (ROBAXIN) 500 MG tablet Take 1 tablet (500 mg total) by mouth every 8 (eight) hours as needed for muscle spasms (pain from rib fractues). 06/02/23   Juliet Rude, PA-C  mirtazapine (REMERON) 15 MG tablet Take  15 mg by mouth at bedtime. 05/29/16   [provider]  Multiple Vitamin (MULTIVITAMIN WITH MINERALS) TABS tablet Take 1 tablet by mouth daily.    [provider]  Multiple Vitamins-Minerals (PRESERVISION AREDS 2 PO) Take 1 tablet by mouth in the morning and at bedtime.    [provider]  omeprazole (PRILOSEC) 40 MG capsule Take 40 mg by mouth daily.    [provider]      Allergies    Codeine, Fentanyl, Morphine, and Morphine and codeine    Review of Systems   Review of Systems  Unable to perform ROS: Mental status change    Physical Exam Updated Vital Signs BP (!) 155/77 (BP Location: Left Arm)   Pulse 73   Temp 98.2 F (36.8 C) (Oral)   Resp 18   Wt 78.3 kg   SpO2 96%   BMI 30.59 kg/m  Physical Exam Vitals and nursing note reviewed.  Constitutional:      General: She is not in acute distress.    Appearance: Normal appearance. She is well-developed.  HENT:     Head: Normocephalic.     Comments: She has some bruising and abrasions over her right forehead Eyes:     Conjunctiva/sclera: Conjunctivae normal.  Cardiovascular:     Rate and Rhythm: Normal rate and regular rhythm.     Heart sounds: No murmur heard. Pulmonary:     Effort: Pulmonary  effort is normal. No respiratory distress.     Breath sounds: Normal breath sounds.  Chest:    Abdominal:     Palpations: Abdomen is soft.     Tenderness: There is no abdominal tenderness. There is no guarding or rebound.  Musculoskeletal:        General: Signs of injury present. No deformity.     Cervical back: Neck supple. No tenderness.     Comments: She looks like she has some older bruising over her right shoulder and her right dorsum of hand.  Skin:    General: Skin is warm and dry.     Capillary Refill: Capillary refill takes less than 2 seconds.  Neurological:     General: No focal deficit present.     Mental Status: She is disoriented.     Comments: She is awake although very  slow to respond.  She will follow some basic commands.  Per EMS she is at her baseline.     ED Results / Procedures / Treatments   Labs (all labs ordered are listed, but only abnormal results are displayed) Labs Reviewed  BASIC METABOLIC PANEL - Abnormal; Notable for the following components:      Result Value   Potassium 3.2 (*)    Glucose, Bld 105 (*)    Calcium 8.4 (*)    All other components within normal limits  CBC WITH DIFFERENTIAL/PLATELET - Abnormal; Notable for the following components:   Neutro Abs 8.0 (*)    Lymphs Abs 0.6 (*)    All other components within normal limits  URINALYSIS, ROUTINE W REFLEX MICROSCOPIC    EKG None  Radiology CT Shoulder Right Wo Contrast  Result Date: 08/28/2023 CLINICAL DATA:  Blunt chest trauma. Right rib fractures. Possible proximal humeral fracture on radiographs. EXAM: CT OF THE UPPER RIGHT EXTREMITY WITHOUT CONTRAST TECHNIQUE: Multiplanar reformatted images through the right shoulder were generated from the chest CT data obtained earlier, as specifically requested. RADIATION DOSE REDUCTION: This exam was performed according to the departmental dose-optimization program which includes automated exposure control, adjustment of the mA and/or kV according to patient size and/or use of iterative reconstruction technique. COMPARISON:  Radiographs 08/28/2023 and 08/02/2023. Concurrent chest CT. FINDINGS: Bones/Joint/Cartilage Status post right total shoulder arthroplasty with associated beam hardening artifact. No evidence of hardware loosening. No definite acute fracture or dislocation at the right shoulder. The right clavicle and scapula appear intact. There are multiple right-sided rib fractures as dictated on separate chest CT report. Ligaments Suboptimally assessed by CT. Muscles and Tendons No focal muscular hematoma, atrophy or fluid collection identified. Soft tissues Mild subcutaneous edema lateral to the right shoulder without evidence of  focal hematoma. No unexpected foreign body or soft tissue emphysema. Chest CT findings dictated separately. IMPRESSION: 1. No evidence of acute fracture or dislocation at the right shoulder. If the patient has persistent right shoulder pain, recommend radiographic follow-up. 2. Multiple right-sided rib fractures as dictated on separate chest CT report. 3. Mild subcutaneous edema lateral to the right shoulder without evidence of focal hematoma. Electronically Signed   By: Carey Bullocks M.D.   On: 08/28/2023 12:20   CT Chest W Contrast  Result Date: 08/28/2023 CLINICAL DATA:  Blunt chest trauma. EXAM: CT CHEST WITH CONTRAST TECHNIQUE: Multidetector CT imaging of the chest was performed during intravenous contrast administration. RADIATION DOSE REDUCTION: This exam was performed according to the departmental dose-optimization program which includes automated exposure control, adjustment of the mA and/or kV according to patient size  and/or use of iterative reconstruction technique. CONTRAST:  75mL OMNIPAQUE IOHEXOL 300 MG/ML  SOLN COMPARISON:  Chest x-ray 08/28/2023 earlier.  Prior CT 07/06/2023 FINDINGS: Cardiovascular: Heart is slightly enlarged. No pericardial effusion. Coronary artery calcifications are seen. The thoracic aorta has a normal course and caliber with mild scattered calcified atherosclerotic plaque. There is a bovine type aortic arch, normal variant. Mediastinum/Nodes: Patulous esophagus. Small thyroid gland. There is no specific abnormal lymph node enlargement identified in the axillary region, hilum or mediastinum. Lungs/Pleura: Breathing motion seen throughout the examination. There is small right pleural effusion. No left-sided effusion. No pneumothorax. No frank consolidation. Prominent central pulmonary vasculature. Juxtapleural left upper lobe nodule posteriorly is identified on series 4, image 57 measuring 8 mm. Unchanged from previous. Pleural thickening seen in the lateral right lung  base with some nodular parenchymal opacity which is also stable. On series 4, image 109 this area measures 2.0 by 0.9 cm. Upper Abdomen: In the upper abdomen fatty liver infiltration identified. There is some benign-appearing stable hepatic cystic foci. The visualized portions of the adrenal glands are preserved. Musculoskeletal: There is moderate degenerative changes along the spine. Osteopenia. Streak artifact related to the patient's right shoulder arthroplasty this obscures surrounding tissues. There are multiple rib fractures identified. These include the lateral aspect of the right sixth, seventh, eighth, ninth ribs which were seen previously. Is also changes along the tenth rib which was seen previously including the. However there are some new rib fractures identified. This includes the right first rib, second anteriorly, fourth rib, fifth. There is also a second fracture more laterally along the right ninth rib which was not seen previously. Left breast lesion described previously is at the edge of the imaging field today. IMPRESSION: Multiple right-sided rib fractures. Many were seen on the prior examination from July. However there are several new subtle fractures involving right-sided ribs first, second, fourth and fifth rib as well as a subtle area along the ninth rib. Persistent small right effusion.  No pneumothorax. Stable lung nodularity particularly in the right lower lobe which is juxtapleural. Recommend either short follow-up CT in 3 months or a PET-CT scan. Enlarged heart with vascular congestion. Previous breast lesion on the left is not as well defined on today's CT scan. Recommend dedicated mammographic evaluation when appropriate. Aortic Atherosclerosis (ICD10-I70.0). Electronically Signed   By: Karen Kays M.D.   On: 08/28/2023 11:40   DG Hand Complete Right  Result Date: 08/28/2023 CLINICAL DATA:  Fall, bruising. EXAM: RIGHT HAND - COMPLETE 3+ VIEW COMPARISON:  None Available.  FINDINGS: Three views of the right hand. No acute fracture or dislocation. Mild osteoarthritis involving the IP joints and right first CMC joint. IMPRESSION: No acute fracture or dislocation. Mild osteoarthritis. Electronically Signed   By: Orvan Falconer M.D.   On: 08/28/2023 08:46   DG Shoulder Right  Result Date: 08/28/2023 CLINICAL DATA:  Fall, bruising. EXAM: RIGHT SHOULDER - 2+ VIEW COMPARISON:  Right humerus radiographs 08/02/2023. FINDINGS: Possible new cortical irregularity along the right humeral implant from prior right shoulder arthroplasty, suspicious for periprosthetic fracture. No definite subluxation. IMPRESSION: Possible new cortical irregularity along the right humeral implant from prior right shoulder arthroplasty, suspicious for periprosthetic fracture. Right shoulder CT without contrast is recommended for further characterization. Electronically Signed   By: Orvan Falconer M.D.   On: 08/28/2023 08:45   DG Chest 1 View  Result Date: 08/28/2023 CLINICAL DATA:  Fall, bruising. EXAM: CHEST  1 VIEW COMPARISON:  Chest radiograph 08/02/2023. FINDINGS:  Low lung volumes accentuate the pulmonary vasculature and cardiomediastinal silhouette. No consolidation or pulmonary edema. Age-indeterminate rib fracture deformities along the right lower chest wall with associated blunting of the right costophrenic sulcus, possibly reflecting adjacent pleural effusion or hemothorax. Prior right shoulder arthroplasty. IMPRESSION: Age-indeterminate rib fracture deformities along the right lower chest wall with associated blunting of the right costophrenic sulcus, possibly reflecting adjacent pleural effusion or hemothorax. Electronically Signed   By: Orvan Falconer M.D.   On: 08/28/2023 08:41   CT Head Wo Contrast  Result Date: 08/28/2023 CLINICAL DATA:  Head and neck trauma after fall. EXAM: CT HEAD WITHOUT CONTRAST CT CERVICAL SPINE WITHOUT CONTRAST TECHNIQUE: Multidetector CT imaging of the head and  cervical spine was performed following the standard protocol without intravenous contrast. Multiplanar CT image reconstructions of the cervical spine were also generated. RADIATION DOSE REDUCTION: This exam was performed according to the departmental dose-optimization program which includes automated exposure control, adjustment of the mA and/or kV according to patient size and/or use of iterative reconstruction technique. COMPARISON:  08/03/2023 FINDINGS: CT HEAD FINDINGS Brain: No evidence of acute infarction, hemorrhage, hydrocephalus, extra-axial collection or mass lesion/mass effect. High-density in the anterior interhemispheric fissure is stable and attributed to calcification, likely at the [facility]. Mild for age cerebral volume loss and chronic small vessel ischemia. Vascular: No hyperdense vessel or unexpected calcification. Skull: Normal. Negative for fracture or focal lesion. Sinuses/Orbits: No evidence of injury CT CERVICAL SPINE FINDINGS Alignment: Normal. Skull base and vertebrae: No acute fracture. No primary bone lesion or focal pathologic process. Soft tissues and spinal canal: No prevertebral fluid or swelling. No visible canal hematoma. Disc levels: Generalized degenerative endplate and especially facet spurring. Upper chest: No evidence of injury IMPRESSION: No evidence of acute intracranial or cervical spine injury. Electronically Signed   By: Tiburcio Pea M.D.   On: 08/28/2023 08:38   CT Cervical Spine Wo Contrast  Result Date: 08/28/2023 CLINICAL DATA:  Head and neck trauma after fall. EXAM: CT HEAD WITHOUT CONTRAST CT CERVICAL SPINE WITHOUT CONTRAST TECHNIQUE: Multidetector CT imaging of the head and cervical spine was performed following the standard protocol without intravenous contrast. Multiplanar CT image reconstructions of the cervical spine were also generated. RADIATION DOSE REDUCTION: This exam was performed according to the departmental dose-optimization program which includes  automated exposure control, adjustment of the mA and/or kV according to patient size and/or use of iterative reconstruction technique. COMPARISON:  08/03/2023 FINDINGS: CT HEAD FINDINGS Brain: No evidence of acute infarction, hemorrhage, hydrocephalus, extra-axial collection or mass lesion/mass effect. High-density in the anterior interhemispheric fissure is stable and attributed to calcification, likely at the [facility]. Mild for age cerebral volume loss and chronic small vessel ischemia. Vascular: No hyperdense vessel or unexpected calcification. Skull: Normal. Negative for fracture or focal lesion. Sinuses/Orbits: No evidence of injury CT CERVICAL SPINE FINDINGS Alignment: Normal. Skull base and vertebrae: No acute fracture. No primary bone lesion or focal pathologic process. Soft tissues and spinal canal: No prevertebral fluid or swelling. No visible canal hematoma. Disc levels: Generalized degenerative endplate and especially facet spurring. Upper chest: No evidence of injury IMPRESSION: No evidence of acute intracranial or cervical spine injury. Electronically Signed   By: Tiburcio Pea M.D.   On: 08/28/2023 08:38    Procedures Procedures    Medications Ordered in ED Medications - No data to display  ED Course/ Medical Decision Making/ A&P Clinical Course as of 08/28/23 1726  Fri Aug 28, 2023  0803 Review of prior ED records.  This is her fifth fall in the last 3 months. [MB]  1236 I updated patient's daughter-in-law and she is fine with her going back to the facility.  I curbside consulted trauma attending Dr. Bedelia Person and she felt it was reasonable to discharge patient. [MB]    Clinical Course User Index [MB] Terrilee Files, MD                                 Medical Decision Making Amount and/or Complexity of Data Reviewed Labs: ordered. Radiology: ordered.  Risk Prescription drug management.   This patient complains of possible injuries from a fall; this involves an extensive  number of treatment Options and is a complaint that carries with it a high risk of complications and morbidity. The differential includes fracture, contusion, bleed, pneumothorax, hemothorax  I ordered, reviewed and interpreted labs, which included CBC with normal white count normal hemoglobin, chemistries with mildly low potassium I ordered imaging studies which included chest x-ray, CT head and cervical spine, right shoulder right hand, CT chest CT shoulder and I independently    visualized and interpreted imaging which showed multiple rib fractures of various ages Additional history obtained from patient's daughter-in-law and EMS Previous records obtained and reviewed prior ED visits and recent trauma admission I consulted trauma Dr. Bedelia Person and discussed lab and imaging findings and discussed disposition.  Cardiac monitoring reviewed, sinus rhythm Social determinants considered, no significant barriers Critical Interventions: None  After the interventions stated above, I reevaluated the patient and found patient to be resting comfortably oxygen saturations are 100% on room air in no distress Admission and further testing considered, after discussion with her daughter-in-law she is comfortable with her returning back to her facility.  It sounds like they are trying to increase her caregiver time and she may need to go into a locked unit.         Final Clinical Impression(s) / ED Diagnoses Final diagnoses:  Fall, initial encounter  Closed fracture of multiple ribs of right side, initial encounter  Breast lesion  Injury of head, initial encounter    Rx / DC Orders ED Discharge Orders     None         Terrilee Files, MD 08/28/23 1728

## 2023-09-08 ENCOUNTER — Emergency Department (HOSPITAL_COMMUNITY): Payer: Medicare HMO

## 2023-09-08 ENCOUNTER — Other Ambulatory Visit: Payer: Self-pay

## 2023-09-08 ENCOUNTER — Emergency Department (HOSPITAL_COMMUNITY)
Admission: EM | Admit: 2023-09-08 | Discharge: 2023-09-08 | Disposition: A | Discharge status: Home / Self Care | Payer: Medicare HMO | Attending: Emergency Medicine | Admitting: Emergency Medicine

## 2023-09-08 ENCOUNTER — Encounter (HOSPITAL_COMMUNITY): Payer: Self-pay | Admitting: Emergency Medicine

## 2023-09-08 DIAGNOSIS — I10 Essential (primary) hypertension: Secondary | ICD-10-CM | POA: Insufficient documentation

## 2023-09-08 DIAGNOSIS — W1830XA Fall on same level, unspecified, initial encounter: Secondary | ICD-10-CM | POA: Insufficient documentation

## 2023-09-08 DIAGNOSIS — Z1152 Encounter for screening for COVID-19: Secondary | ICD-10-CM | POA: Diagnosis not present

## 2023-09-08 DIAGNOSIS — G20C Parkinsonism, unspecified: Secondary | ICD-10-CM | POA: Insufficient documentation

## 2023-09-08 DIAGNOSIS — W19XXXA Unspecified fall, initial encounter: Secondary | ICD-10-CM

## 2023-09-08 DIAGNOSIS — S0990XA Unspecified injury of head, initial encounter: Secondary | ICD-10-CM | POA: Diagnosis present

## 2023-09-08 DIAGNOSIS — S60511A Abrasion of right hand, initial encounter: Secondary | ICD-10-CM | POA: Diagnosis not present

## 2023-09-08 DIAGNOSIS — S0001XA Abrasion of scalp, initial encounter: Secondary | ICD-10-CM

## 2023-09-08 LAB — CBC WITH DIFFERENTIAL/PLATELET
Abs Immature Granulocytes: 0.03 10*3/uL (ref 0.00–0.07)
Basophils Absolute: 0 10*3/uL (ref 0.0–0.1)
Basophils Relative: 0 %
Eosinophils Absolute: 0.1 10*3/uL (ref 0.0–0.5)
Eosinophils Relative: 1 %
HCT: 45.3 % (ref 36.0–46.0)
Hemoglobin: 14.7 g/dL (ref 12.0–15.0)
Immature Granulocytes: 0 %
Lymphocytes Relative: 8 %
Lymphs Abs: 0.7 10*3/uL (ref 0.7–4.0)
MCH: 29.5 pg (ref 26.0–34.0)
MCHC: 32.5 g/dL (ref 30.0–36.0)
MCV: 91 fL (ref 80.0–100.0)
Monocytes Absolute: 0.5 10*3/uL (ref 0.1–1.0)
Monocytes Relative: 6 %
Neutro Abs: 6.8 10*3/uL (ref 1.7–7.7)
Neutrophils Relative %: 85 %
Platelets: 142 10*3/uL — ABNORMAL LOW (ref 150–400)
RBC: 4.98 MIL/uL (ref 3.87–5.11)
RDW: 13.4 % (ref 11.5–15.5)
WBC: 8 10*3/uL (ref 4.0–10.5)
nRBC: 0 % (ref 0.0–0.2)

## 2023-09-08 LAB — COMPREHENSIVE METABOLIC PANEL
ALT: 16 U/L (ref 0–44)
AST: 15 U/L (ref 15–41)
Albumin: 3.4 g/dL — ABNORMAL LOW (ref 3.5–5.0)
Alkaline Phosphatase: 98 U/L (ref 38–126)
Anion gap: 9 (ref 5–15)
BUN: 11 mg/dL (ref 8–23)
CO2: 27 mmol/L (ref 22–32)
Calcium: 8.6 mg/dL — ABNORMAL LOW (ref 8.9–10.3)
Chloride: 106 mmol/L (ref 98–111)
Creatinine, Ser: 0.65 mg/dL (ref 0.44–1.00)
GFR, Estimated: >60 mL/min (ref >60)
Glucose, Bld: 103 mg/dL — ABNORMAL HIGH (ref 70–99)
Potassium: 3.1 mmol/L — ABNORMAL LOW (ref 3.5–5.1)
Sodium: 142 mmol/L (ref 135–145)
Total Bilirubin: 0.7 mg/dL (ref 0.3–1.2)
Total Protein: 5.8 g/dL — ABNORMAL LOW (ref 6.5–8.1)

## 2023-09-08 LAB — URINALYSIS, W/ REFLEX TO CULTURE (INFECTION SUSPECTED)
Bilirubin Urine: NEGATIVE
Glucose, UA: NEGATIVE mg/dL
Hgb urine dipstick: NEGATIVE
Ketones, ur: NEGATIVE mg/dL
Leukocytes,Ua: NEGATIVE
Nitrite: NEGATIVE
Protein, ur: NEGATIVE mg/dL
Specific Gravity, Urine: 1.015 (ref 1.005–1.030)
pH: 7 (ref 5.0–8.0)

## 2023-09-08 LAB — SARS CORONAVIRUS 2 BY RT PCR: SARS Coronavirus 2 by RT PCR: NEGATIVE

## 2023-09-08 LAB — TROPONIN I (HIGH SENSITIVITY)
Troponin I (High Sensitivity): 5 ng/L (ref <18)
Troponin I (High Sensitivity): 9 ng/L (ref <18)

## 2023-09-08 MED ORDER — ACETAMINOPHEN 500 MG PO TABS
1000.0000 mg | ORAL_TABLET | Freq: Once | ORAL | Status: AC
  Administered 2023-09-08: 1000 mg via ORAL
  Filled 2023-09-08: qty 2

## 2023-09-08 MED ORDER — SODIUM CHLORIDE 0.9 % IV BOLUS
500.0000 mL | Freq: Once | INTRAVENOUS | Status: AC
  Administered 2023-09-08: 500 mL via INTRAVENOUS

## 2023-09-08 NOTE — ED Triage Notes (Addendum)
Patient presents via EMS from [facility] assisted [facility] facility for unwitnessed fall. Patient arrives in c-collar with laceration to right forehead; bleeding controlled. Per EMS, this is the fifth time that the patient has fallen in the last few weeks. Per EMS, patient is altered at baseline (oriented to self) but is usually talkative. Per EMS, patient's facility reported that she was weaker than usual and not as communicative. Per EMS, patient had a recent UTI but finished antibiotics. Patient endorsing right hand pain upon arrival with skin tear present; bleeding controlled.

## 2023-09-08 NOTE — Discharge Instructions (Signed)
You were seen in the emergency department today after a fall.  He sustained some skin abrasions which need to stay clean and dry.  There are no broken bones or internal bleeding.  Lab work is largely normal.  I do not see a sign of urinary tract infection.  Please continue to follow with your primary care doctor and return with any new or suddenly worsening symptoms.

## 2023-09-08 NOTE — ED Notes (Signed)
Confused. Kicking legs over rail. Sitter at bedside. Awaiting on [facility] for transport

## 2023-09-08 NOTE — ED Notes (Signed)
Myself and another tech changed pt. Pt is clean and dry, waiting on [facility].

## 2023-09-08 NOTE — ED Provider Notes (Signed)
Emergency Department Provider Note   I have reviewed the triage vital signs and the nursing notes.   HISTORY  Chief Complaint Fall   HPI Emily Escobar is a 87 y.o. female with past history reviewed below presents to the emergency department after unwitnessed fall at her nursing facility.  She has had multiple falls this past year with frequent ED visits.  EMS report that she is slightly weaker than they have seen her in the past although her mental status is at baseline.  She is alert oriented to herself and occasionally place.  EMS report that she has recently completed a course of antibiotics for presumed UTI.  Patient tells me she is having some pain in her right hand and a mild headache.  She does not recall the specific circumstances of her fall.    Past Medical History:  Diagnosis Date   Anxiety and depression    Closed left femoral fracture (HCC)    History of vertebral fracture    from fall in September 2016   Hypertension    Insomnia    Macular degeneration    Memory loss    Parkinsonism    Solitary kidney     Review of Systems  Constitutional: No fever/chills Cardiovascular: Denies chest pain. Respiratory: Denies shortness of breath. Gastrointestinal: No abdominal pain.  Musculoskeletal: Negative for back pain. Positive right hand pain/swelling.  Skin: Negative for rash. Neurological: Mild HA.   ____________________________________________   PHYSICAL EXAM:  VITAL SIGNS: ED Triage Vitals  Encounter Vitals Group     BP 09/08/23 0910 (!) 166/74     Pulse Rate 09/08/23 0910 61     Resp 09/08/23 0910 20     Temp 09/08/23 0910 98.1 F (36.7 C)     Temp Source 09/08/23 0910 Oral     SpO2 09/08/23 0856 98 %     Weight 09/08/23 0911 172 lb 11.2 oz (78.3 kg)     Height 09/08/23 0911 5\' 3"  (1.6 m)   Constitutional: Alert and oriented to person. Well appearing and in no acute distress. Eyes: Conjunctivae are normal. Head: Abrasion to the forehead without  laceration.  Nose: No congestion/rhinnorhea. Mouth/Throat: Mucous membranes are moist.  Oropharynx non-erythematous. No obvious dental injury.  Neck: No stridor. C collar in place.  Cardiovascular: Normal rate, regular rhythm. Good peripheral circulation. Grossly normal heart sounds.   Respiratory: Normal respiratory effort.  No retractions. Lungs CTAB. Gastrointestinal: Soft and nontender. No distention.  Musculoskeletal: No lower extremity tenderness nor edema. No gross deformities of extremities. Neurologic:  Normal speech and language. No gross focal neurologic deficits are appreciated.  Skin:  Skin is warm and dry. Abrasion to the scalp and skin tear to the dorsal aspect of the right hand.    ____________________________________________   LABS (all labs ordered are listed, but only abnormal results are displayed)  Labs Reviewed  CBC WITH DIFFERENTIAL/PLATELET - Abnormal; Notable for the following components:      Result Value   Platelets 142 (*)    All other components within normal limits  URINALYSIS, W/ REFLEX TO CULTURE (INFECTION SUSPECTED) - Abnormal; Notable for the following components:   Bacteria, UA RARE (*)    All other components within normal limits  COMPREHENSIVE METABOLIC PANEL - Abnormal; Notable for the following components:   Potassium 3.1 (*)    Glucose, Bld 103 (*)    Calcium 8.6 (*)    Total Protein 5.8 (*)    Albumin 3.4 (*)  All other components within normal limits  SARS CORONAVIRUS 2 BY RT PCR  TROPONIN I (HIGH SENSITIVITY)  TROPONIN I (HIGH SENSITIVITY)   ____________________________________________  EKG   EKG Interpretation Date/Time:  Tuesday September 08 2023 09:17:39 EDT Ventricular Rate:  62 PR Interval:  175 QRS Duration:  86 QT Interval:  430 QTC Calculation: 437 R Axis:   -12  Text Interpretation: Sinus or ectopic atrial rhythm Inferior infarct, old Probable anterior infarct, old Lateral leads are also involved Confirmed by  Alona Bene [phone]) on 09/08/2023 9:21:20 AM        ____________________________________________  RADIOLOGY  DG Pelvis Portable  Result Date: 09/08/2023 CLINICAL DATA:  Pain, unwitnessed fall. EXAM: PORTABLE PELVIS 1-2 VIEWS COMPARISON:  Radiograph 07/14/2023 FINDINGS: No acute pelvic fracture. No symphyseal or sacroiliac diastasis. Left hip arthroplasty is intact. No periprosthetic lucency or fracture. Right femoral head is well seated in the acetabulum. IMPRESSION: No acute pelvic fracture. Left hip arthroplasty without complication. Electronically Signed   By: Narda Rutherford M.D.   On: 09/08/2023 12:22   DG Knee Complete 4 Views Right  Result Date: 09/08/2023 CLINICAL DATA:  Unwitnessed fall.  Pain. EXAM: RIGHT KNEE - COMPLETE 4+ VIEW COMPARISON:  Radiograph 08/02/2023 FINDINGS: No acute fracture or dislocation. Similar degenerative change. No significant effusion. No erosive or bony destructive change. Unremarkable soft tissues. IMPRESSION: No acute fracture or dislocation. Similar degenerative change. Electronically Signed   By: Narda Rutherford M.D.   On: 09/08/2023 12:21   DG Hand Complete Right  Result Date: 09/08/2023 CLINICAL DATA:  Unwitnessed fall.  Right hand pain. EXAM: RIGHT HAND - COMPLETE 3+ VIEW COMPARISON:  Hand radiograph 08/28/2023. FINDINGS: Lateral view limited by positioning. Allowing for this, no evidence of fracture. No dislocation. Multifocal osteoarthritis primarily involving the digits. No erosive change. Dorsal soft tissue edema. IMPRESSION: 1. Dorsal soft tissue edema. No acute fracture or subluxation. 2. Multifocal osteoarthritis. Electronically Signed   By: Narda Rutherford M.D.   On: 09/08/2023 12:20   CT Head Wo Contrast  Result Date: 09/08/2023 CLINICAL DATA:  Fall with minor head trauma EXAM: CT HEAD WITHOUT CONTRAST CT CERVICAL SPINE WITHOUT CONTRAST TECHNIQUE: Multidetector CT imaging of the head and cervical spine was performed following the standard  protocol without intravenous contrast. Multiplanar CT image reconstructions of the cervical spine were also generated. RADIATION DOSE REDUCTION: This exam was performed according to the departmental dose-optimization program which includes automated exposure control, adjustment of the mA and/or kV according to patient size and/or use of iterative reconstruction technique. COMPARISON:  08/28/2023 FINDINGS: CT HEAD FINDINGS Brain: No evidence of acute infarction, hemorrhage, hydrocephalus, extra-axial collection or mass lesion/mass effect. Brain atrophy, especially perisylvian. Vascular: No hyperdense vessel or unexpected calcification. Skull: Normal. Negative for fracture or focal lesion. Sinuses/Orbits: No evidence of injury CT CERVICAL SPINE FINDINGS Alignment: No traumatic malalignment Skull base and vertebrae: Chronic superior endplate fractures of T2 and T4. Chronic right posterior rib fractures. No acute fracture Soft tissues and spinal canal: No prevertebral fluid or swelling. No visible canal hematoma. Disc levels: Degenerative endplate and facet spurring. Facet spurring asymmetrically affects right mid levels. Upper chest: Multiple nonacute right rib fractures, known. IMPRESSION: No evidence of acute intracranial or cervical spine injury. Electronically Signed   By: Tiburcio Pea M.D.   On: 09/08/2023 10:33   CT Cervical Spine Wo Contrast  Result Date: 09/08/2023 CLINICAL DATA:  Fall with minor head trauma EXAM: CT HEAD WITHOUT CONTRAST CT CERVICAL SPINE WITHOUT CONTRAST TECHNIQUE: Multidetector CT imaging  of the head and cervical spine was performed following the standard protocol without intravenous contrast. Multiplanar CT image reconstructions of the cervical spine were also generated. RADIATION DOSE REDUCTION: This exam was performed according to the departmental dose-optimization program which includes automated exposure control, adjustment of the mA and/or kV according to patient size and/or use  of iterative reconstruction technique. COMPARISON:  08/28/2023 FINDINGS: CT HEAD FINDINGS Brain: No evidence of acute infarction, hemorrhage, hydrocephalus, extra-axial collection or mass lesion/mass effect. Brain atrophy, especially perisylvian. Vascular: No hyperdense vessel or unexpected calcification. Skull: Normal. Negative for fracture or focal lesion. Sinuses/Orbits: No evidence of injury CT CERVICAL SPINE FINDINGS Alignment: No traumatic malalignment Skull base and vertebrae: Chronic superior endplate fractures of T2 and T4. Chronic right posterior rib fractures. No acute fracture Soft tissues and spinal canal: No prevertebral fluid or swelling. No visible canal hematoma. Disc levels: Degenerative endplate and facet spurring. Facet spurring asymmetrically affects right mid levels. Upper chest: Multiple nonacute right rib fractures, known. IMPRESSION: No evidence of acute intracranial or cervical spine injury. Electronically Signed   By: Tiburcio Pea M.D.   On: 09/08/2023 10:33    ____________________________________________   PROCEDURES  Procedure(s) performed:   Procedures  None  ____________________________________________   INITIAL IMPRESSION / ASSESSMENT AND PLAN / ED COURSE  Pertinent labs & imaging results that were available during my care of the patient were reviewed by me and considered in my medical decision making (see chart for details).   This patient is Presenting for Evaluation of fall, which does require a range of treatment options, and is a complaint that involves a high risk of morbidity and mortality.  The Differential Diagnoses includes subdural hematoma, epidural hematoma, acute concussion, traumatic subarachnoid hemorrhage, cerebral contusions, etc.   Critical Interventions-    Medications  sodium chloride 0.9 % bolus 500 mL (0 mLs Intravenous Stopped 09/08/23 1108)  acetaminophen (TYLENOL) tablet 1,000 mg (1,000 mg Oral Given 09/08/23 0945)     Reassessment after intervention: symptoms improved.    I did obtain Additional Historical Information from EMS.  I decided to review pertinent External Data, and in summary with multiple ED presentations for fall.  Mental status in those encounters documented as similar to what I am experiencing here.   Clinical Laboratory Tests Ordered, included COVID-negative.  Troponin negative x 2.  No acute kidney injury.  No UTI.  No leukocytosis or anemia.  Radiologic Tests Ordered, included XR pelvis, hand, and knee along with CT head and c spine. I independently interpreted the images and agree with radiology interpretation.   Cardiac Monitor Tracing which shows NSR.    Social Determinants of Health Risk patient is a non-smoker.   Medical Decision Making: Summary:  Presents emergency department after fall at her assisted living facility.  She has multiple ED visits for fall.  I do not appreciate any focal neurodeficits and she appears to be at baseline both in my chart review and in discussion with EMS.  I do not find any evidence of stroke.  I did undertake lab evaluation as patient was not able to provide significant history around the circumstances of the fall.   Reevaluation with update and discussion with patient.  Labs are reassuring.  Imaging without fracture.  Plan for discharge back to her assisted living facility with [facility].  Considered admission but appears to be at baseline with no acute findings on ED workup.   Patient's presentation is most consistent with acute presentation with potential threat to life or  bodily function.   Disposition: discharge  ____________________________________________  FINAL CLINICAL IMPRESSION(S) / ED DIAGNOSES  Final diagnoses:  Fall, initial encounter  Injury of head, initial encounter  Abrasion of scalp, initial encounter  Abrasion of right hand, initial encounter    Note:  This document was prepared using Dragon voice recognition  software and may include unintentional dictation errors.  Alona Bene, MD, [facility] Emergency Medicine    Irvin Bastin, Arlyss Repress, MD 09/08/23 [phone]

## 2023-09-19 ENCOUNTER — Emergency Department (HOSPITAL_COMMUNITY)
Admission: EM | Admit: 2023-09-19 | Discharge: 2023-09-19 | Disposition: A | Discharge status: Home / Self Care | Payer: Medicare HMO | Attending: Emergency Medicine | Admitting: Emergency Medicine

## 2023-09-19 ENCOUNTER — Emergency Department (HOSPITAL_COMMUNITY): Payer: Medicare HMO

## 2023-09-19 ENCOUNTER — Other Ambulatory Visit: Payer: Self-pay

## 2023-09-19 DIAGNOSIS — I10 Essential (primary) hypertension: Secondary | ICD-10-CM | POA: Insufficient documentation

## 2023-09-19 DIAGNOSIS — G20C Parkinsonism, unspecified: Secondary | ICD-10-CM | POA: Insufficient documentation

## 2023-09-19 DIAGNOSIS — Z1152 Encounter for screening for COVID-19: Secondary | ICD-10-CM | POA: Diagnosis not present

## 2023-09-19 DIAGNOSIS — Z96642 Presence of left artificial hip joint: Secondary | ICD-10-CM | POA: Insufficient documentation

## 2023-09-19 DIAGNOSIS — F0390 Unspecified dementia without behavioral disturbance: Secondary | ICD-10-CM | POA: Insufficient documentation

## 2023-09-19 DIAGNOSIS — R079 Chest pain, unspecified: Secondary | ICD-10-CM | POA: Insufficient documentation

## 2023-09-19 LAB — CK: Total CK: 29 U/L — ABNORMAL LOW (ref 38–234)

## 2023-09-19 LAB — CBC
HCT: 43.7 % (ref 36.0–46.0)
Hemoglobin: 13.8 g/dL (ref 12.0–15.0)
MCH: 28.8 pg (ref 26.0–34.0)
MCHC: 31.6 g/dL (ref 30.0–36.0)
MCV: 91.2 fL (ref 80.0–100.0)
Platelets: 170 10*3/uL (ref 150–400)
RBC: 4.79 MIL/uL (ref 3.87–5.11)
RDW: 12.8 % (ref 11.5–15.5)
WBC: 7.4 10*3/uL (ref 4.0–10.5)
nRBC: 0 % (ref 0.0–0.2)

## 2023-09-19 LAB — TROPONIN I (HIGH SENSITIVITY)
Troponin I (High Sensitivity): 8 ng/L (ref <18)
Troponin I (High Sensitivity): 7 ng/L (ref <18)

## 2023-09-19 LAB — COMPREHENSIVE METABOLIC PANEL
ALT: 16 U/L (ref 0–44)
AST: 15 U/L (ref 15–41)
Albumin: 3.5 g/dL (ref 3.5–5.0)
Alkaline Phosphatase: 97 U/L (ref 38–126)
Anion gap: 11 (ref 5–15)
BUN: 14 mg/dL (ref 8–23)
CO2: 26 mmol/L (ref 22–32)
Calcium: 8.9 mg/dL (ref 8.9–10.3)
Chloride: 103 mmol/L (ref 98–111)
Creatinine, Ser: 0.68 mg/dL (ref 0.44–1.00)
GFR, Estimated: >60 mL/min (ref >60)
Glucose, Bld: 112 mg/dL — ABNORMAL HIGH (ref 70–99)
Potassium: 3.5 mmol/L (ref 3.5–5.1)
Sodium: 140 mmol/L (ref 135–145)
Total Bilirubin: 1 mg/dL (ref 0.3–1.2)
Total Protein: 6.1 g/dL — ABNORMAL LOW (ref 6.5–8.1)

## 2023-09-19 LAB — LIPASE, BLOOD: Lipase: 25 U/L (ref 11–51)

## 2023-09-19 LAB — SARS CORONAVIRUS 2 BY RT PCR: SARS Coronavirus 2 by RT PCR: NEGATIVE

## 2023-09-19 NOTE — Discharge Instructions (Signed)
You were evaluated in the Emergency Department and after careful evaluation, we did not find any emergent condition requiring admission or further testing in the hospital.  Your exam/testing today is overall reassuring.  Please return to the Emergency Department if you experience any worsening of your condition.   Thank you for allowing us to be a part of your care. 

## 2023-09-19 NOTE — ED Notes (Signed)
Pt attempting to get out of bed, RN at bedside to redirect pt. Bed alarm applied, pt repositioned, and water provided to pt with call light in reach and door open.

## 2023-09-19 NOTE — ED Notes (Signed)
Called [facility] to transport patient to [facility]

## 2023-09-19 NOTE — ED Notes (Signed)
Pt continues to try to get out of bed, this RN and NT at bedside. Bed alarm and safety mitten applied.

## 2023-09-19 NOTE — ED Provider Notes (Signed)
[facility]-EMERGENCY DEPT [facility] Emergency Department Provider Note MRN:  [id number]  Arrival date & time: 09/19/23     Chief Complaint   Chest Pain   History of Present Illness   Emily Escobar is a 87 y.o. year-old female with a history of hypertension, dementia presenting to the ED with chief complaint of chest pain.  Coming from a facility complaining of chest pain, achiness, headache, generalized weakness.  No complaints at this time.  Review of Systems  I was unable to obtain a full/accurate HPI, PMH, or ROS due to the patient's dementia.  Patient's Health History    Past Medical History:  Diagnosis Date   Anxiety and depression    Closed left femoral fracture (HCC)    History of vertebral fracture    from fall in September 2016   Hypertension    Insomnia    Macular degeneration    Memory loss    Parkinsonism    Solitary kidney     Past Surgical History:  Procedure Laterality Date   ABDOMINAL HYSTERECTOMY     BLADDER SUSPENSION     BREAST SURGERY     CATARACT EXTRACTION, BILATERAL     EYE SURGERY     GALLBLADDER SURGERY     JOINT REPLACEMENT     NASAL SEPTUM SURGERY     RECONSTRUCTION OF EYELID     RECTOCELE REPAIR     TOTAL HIP ARTHROPLASTY Left 07/17/2016   Procedure: TOTAL HIP ARTHROPLASTY ANTERIOR APPROACH;  Surgeon: Tarry Kos, MD;  Location: [facility] OR;  Service: Orthopedics;  Laterality: Left;    Family History  Problem Relation Age of Onset   Heart disease Mother    Heart attack Father    Heart disease Brother    Heart disease Brother     Social History   Socioeconomic History   Marital status: Widowed    Spouse name: Not on file   Number of children: 2   Years of education: 19   Highest education level: Not on file  Occupational History   Occupation: retired  Tobacco Use   Smoking status: Never   Smokeless tobacco: Never  Vaping Use   Vaping status: Never Used  Substance and Sexual Activity   Alcohol use: Not Currently   Drug use: No    Sexual activity: Not on file  Other Topics Concern   Not on file  Social History Narrative   Lives at home alone.   Right-handed.   Caffeine use: one cup coffee.   Social Determinants of Health   Financial Resource Strain: Not on file  Food Insecurity: Not on file  Transportation Needs: Not on file  Physical Activity: Not on file  Stress: Not on file  Social Connections: Unknown (04/29/2022)   Received from [facility]   Social Network    Social Network: Not on file  Intimate Partner Violence: Unknown (03/21/2022)   Received from [facility]   HITS    Physically Hurt: Not on file    Insult or Talk Down To: Not on file    Threaten Physical Harm: Not on file    Scream or Curse: Not on file     Physical Exam   Vitals:   09/19/23 0300 09/19/23 0400  BP: (!) 147/68 (!) 176/92  Pulse: 86   Resp: 20 20  Temp:    SpO2: 94%     CONSTITUTIONAL: Chronically ill-appearing, NAD NEURO/PSYCH:  Alert, oriented to name, moves all extremities EYES:  eyes equal and reactive ENT/NECK:  no LAD, no JVD CARDIO: Regular rate, well-perfused, normal S1 and S2 PULM:  CTAB no wheezing or rhonchi GI/GU:  non-distended, non-tender MSK/SPINE:  No gross deformities, no edema SKIN:  no rash, atraumatic   *Additional and/or pertinent findings included in MDM below  Diagnostic and Interventional Summary    EKG Interpretation Date/Time:  Saturday September 19 2023 01:26:44 EDT Ventricular Rate:  90 PR Interval:  134 QRS Duration:  95 QT Interval:  385 QTC Calculation: 472 R Axis:   -41  Text Interpretation: Sinus or ectopic atrial rhythm Inferior infarct, old Anterior infarct, old Lateral leads are also involved Confirmed by Kennis Carina [phone]) on 09/19/2023 1:43:06 AM       Labs Reviewed  COMPREHENSIVE METABOLIC PANEL - Abnormal; Notable for the following components:      Result Value   Glucose, Bld 112 (*)    Total Protein 6.1 (*)    All other  components within normal limits  CK - Abnormal; Notable for the following components:   Total CK 29 (*)    All other components within normal limits  SARS CORONAVIRUS 2 BY RT PCR  CBC  LIPASE, BLOOD  TROPONIN I (HIGH SENSITIVITY)  TROPONIN I (HIGH SENSITIVITY)    DG Chest Port 1 View  Final Result      Medications - No data to display   Procedures  /  Critical Care Procedures  ED Course and Medical Decision Making  Initial Impression and Ddx Patient is without symptoms at this time, considering ACS given the chest pain, could be MSK given the more diffuse aches and pains.  Considering rhabdomyolysis, COVID-19, electrolyte disturbance, pneumonia.  Past medical/surgical history that increases complexity of ED encounter: Dementia  Interpretation of Diagnostics I personally reviewed the EKG and my interpretation is as follows: Sinus rhythm with nonspecific findings  Labs reassuring with no significant blood count or electrolyte disturbance.  Troponin negative x 2.  Patient Reassessment and Ultimate Disposition/Management     No emergent process is evident, no indication for further testing or admission patient is appropriate for discharge.  Tried to reach out to emergency contact, no answer.  Patient management required discussion with the following services or consulting groups:  None  Complexity of Problems Addressed Acute illness or injury that poses threat of life of bodily function  Additional Data Reviewed and Analyzed Further history obtained from: EMS on arrival  Additional Factors Impacting ED Encounter Risk Consideration of hospitalization  Elmer Sow. Pilar Plate, MD [facility] Emergency Medicine [facility] [email or web address]  Final Clinical Impressions(s) / ED Diagnoses     ICD-10-CM   1. Chest pain, unspecified type  R07.9       ED Discharge Orders     None        Discharge Instructions Discussed with and Provided to Patient:      Discharge Instructions      You were evaluated in the Emergency Department and after careful evaluation, we did not find any emergent condition requiring admission or further testing in the hospital.  Your exam/testing today is overall reassuring.  Please return to the Emergency Department if you experience any worsening of your condition.   Thank you for allowing [place] to be a part of your care.       Sabas Sous, MD 09/19/23 [phone]

## 2023-09-19 NOTE — ED Triage Notes (Signed)
[name] EMS from [facility] [place] [place]. C/o CP achy, HA. Per ASF generalized weakness since pt woke up and started complaining of cp.   Hx dementia, CAD, (takes thinners per facility)  EMS gave 324 asa, .4mg  nitroglycerin,  EMS VS 200/92, HR 99, 93%, cbg 121

## 2023-10-07 ENCOUNTER — Other Ambulatory Visit: Payer: Self-pay

## 2023-10-07 ENCOUNTER — Emergency Department (HOSPITAL_COMMUNITY): Payer: Medicare HMO

## 2023-10-07 ENCOUNTER — Emergency Department (HOSPITAL_COMMUNITY)
Admission: EM | Admit: 2023-10-07 | Discharge: 2023-10-07 | Disposition: A | Discharge status: Home / Self Care | Payer: Medicare HMO | Attending: Emergency Medicine | Admitting: Emergency Medicine

## 2023-10-07 DIAGNOSIS — R4182 Altered mental status, unspecified: Secondary | ICD-10-CM | POA: Diagnosis not present

## 2023-10-07 DIAGNOSIS — B9689 Other specified bacterial agents as the cause of diseases classified elsewhere: Secondary | ICD-10-CM | POA: Diagnosis not present

## 2023-10-07 DIAGNOSIS — R0789 Other chest pain: Secondary | ICD-10-CM | POA: Diagnosis not present

## 2023-10-07 DIAGNOSIS — N390 Urinary tract infection, site not specified: Secondary | ICD-10-CM | POA: Diagnosis not present

## 2023-10-07 DIAGNOSIS — R079 Chest pain, unspecified: Secondary | ICD-10-CM | POA: Diagnosis present

## 2023-10-07 LAB — CBC WITH DIFFERENTIAL/PLATELET
Abs Immature Granulocytes: 0.07 10*3/uL (ref 0.00–0.07)
Basophils Absolute: 0 10*3/uL (ref 0.0–0.1)
Basophils Relative: 1 %
Eosinophils Absolute: 0.2 10*3/uL (ref 0.0–0.5)
Eosinophils Relative: 2 %
HCT: 41.8 % (ref 36.0–46.0)
Hemoglobin: 13 g/dL (ref 12.0–15.0)
Immature Granulocytes: 1 %
Lymphocytes Relative: 12 %
Lymphs Abs: 0.8 10*3/uL (ref 0.7–4.0)
MCH: 28.6 pg (ref 26.0–34.0)
MCHC: 31.1 g/dL (ref 30.0–36.0)
MCV: 92.1 fL (ref 80.0–100.0)
Monocytes Absolute: 0.6 10*3/uL (ref 0.1–1.0)
Monocytes Relative: 9 %
Neutro Abs: 5 10*3/uL (ref 1.7–7.7)
Neutrophils Relative %: 75 %
Platelets: 153 10*3/uL (ref 150–400)
RBC: 4.54 MIL/uL (ref 3.87–5.11)
RDW: 13.1 % (ref 11.5–15.5)
WBC: 6.7 10*3/uL (ref 4.0–10.5)
nRBC: 0 % (ref 0.0–0.2)

## 2023-10-07 LAB — BASIC METABOLIC PANEL
Anion gap: 11 (ref 5–15)
BUN: 16 mg/dL (ref 8–23)
CO2: 23 mmol/L (ref 22–32)
Calcium: 8.6 mg/dL — ABNORMAL LOW (ref 8.9–10.3)
Chloride: 106 mmol/L (ref 98–111)
Creatinine, Ser: 0.65 mg/dL (ref 0.44–1.00)
GFR, Estimated: >60 mL/min (ref >60)
Glucose, Bld: 102 mg/dL — ABNORMAL HIGH (ref 70–99)
Potassium: 3.8 mmol/L (ref 3.5–5.1)
Sodium: 140 mmol/L (ref 135–145)

## 2023-10-07 LAB — TROPONIN I (HIGH SENSITIVITY)
Troponin I (High Sensitivity): 5 ng/L (ref <18)
Troponin I (High Sensitivity): 6 ng/L (ref <18)

## 2023-10-07 LAB — MAGNESIUM: Magnesium: 2.2 mg/dL (ref 1.7–2.4)

## 2023-10-07 LAB — URINALYSIS, W/ REFLEX TO CULTURE (INFECTION SUSPECTED)
Bilirubin Urine: NEGATIVE
Glucose, UA: NEGATIVE mg/dL
Hgb urine dipstick: NEGATIVE
Ketones, ur: 5 mg/dL — AB
Nitrite: NEGATIVE
Protein, ur: NEGATIVE mg/dL
Specific Gravity, Urine: 1.012 (ref 1.005–1.030)
pH: 7 (ref 5.0–8.0)

## 2023-10-07 MED ORDER — SODIUM CHLORIDE 0.9 % IV SOLN
1.0000 g | Freq: Once | INTRAVENOUS | Status: AC
  Administered 2023-10-07: 1 g via INTRAVENOUS
  Filled 2023-10-07: qty 10

## 2023-10-07 MED ORDER — HALOPERIDOL LACTATE 5 MG/ML IJ SOLN
2.0000 mg | Freq: Once | INTRAMUSCULAR | Status: AC
  Administered 2023-10-07: 2 mg via INTRAVENOUS
  Filled 2023-10-07: qty 1

## 2023-10-07 MED ORDER — ACETAMINOPHEN 500 MG PO TABS
1000.0000 mg | ORAL_TABLET | Freq: Once | ORAL | Status: AC
  Administered 2023-10-07: 1000 mg via ORAL
  Filled 2023-10-07: qty 2

## 2023-10-07 MED ORDER — CEPHALEXIN 500 MG PO CAPS
500.0000 mg | ORAL_CAPSULE | Freq: Four times a day (QID) | ORAL | 0 refills | Status: AC
Start: 2023-10-07 — End: 2023-10-14

## 2023-10-07 NOTE — ED Notes (Signed)
PT continuing to try and crawl out of the stretcher, ripping cords off of her and grabbing on to anyone tha comes by. She is swinging her feet over the railing while trying to scoot her self out of bed. Also, ripping her gown off. EDP aware

## 2023-10-07 NOTE — ED Notes (Signed)
ED Provider at bedside. 

## 2023-10-07 NOTE — Discharge Instructions (Addendum)
Workup today was reassuring against any concerning cause of your chest pain.  Have given you referral to cardiology.  Your chest pain could be musculoskeletal in nature given it was reproducible on exam.  He did have a urinary tract infection.  You received a dose of Rocephin in the emergency department.  Antibiotic prescription given as well.  Return for any concerning symptoms.

## 2023-10-07 NOTE — ED Notes (Signed)
Pt continues to roll around in bed with legs hanging over the rales.. she is ripping her blood pressure cuff off and keeps repeating that her chest hurts

## 2023-10-07 NOTE — ED Notes (Signed)
Patient transported to CT 

## 2023-10-07 NOTE — ED Notes (Signed)
Pt states the pain is better.

## 2023-10-07 NOTE — ED Notes (Signed)
Pt is trying to climb out of bed. She is asking for the doctor and repeating herself over and over. She has been fighting with the fall risk bracelet and tore it off throwing it in the floor. She is continuing to fall out of bed and ripping all of her clothes off as well as the monitoring.

## 2023-10-07 NOTE — ED Notes (Incomplete)
Patient placed onto posey alarm.

## 2023-10-07 NOTE — ED Triage Notes (Signed)
[name] [facility] from the [facility] [place]. Pt c/o central CP and increased SOB. In route EMS administered 3 Nitro and 324mg   Asprin.

## 2023-10-07 NOTE — ED Provider Notes (Signed)
[place] EMERGENCY DEPARTMENT AT [facility] Provider Note   CSN: [id number] Arrival date & time: 10/07/23  0102     History  Chief Complaint  Patient presents with   Chest Pain    Emily Escobar is a 87 y.o. female.  87 year old female presents today for concern of chest pain that started 45 minutes prior to arrival.  She was given 3 nitroglycerin as well as 324 of aspirin prior to arrival.  Currently reports resolution of chest pain.  Chest pain was substernal.  Did not radiate anywhere.  No other associated symptoms such as nausea, vomiting, diaphoresis, lightheadedness.  Denies any shortness of breath.  The history is provided by the patient. No language interpreter was used.       Home Medications Prior to Admission medications   Medication Sig Start Date End Date Taking? Authorizing Provider  acetaminophen (TYLENOL) 500 MG tablet Take 1,000 mg by mouth 3 (three) times daily as needed for moderate pain.    [provider]  atenolol (TENORMIN) 50 MG tablet Take 50 mg by mouth daily. 07/02/16   [provider]  atorvastatin (LIPITOR) 20 MG tablet Take 20 mg by mouth daily. 08/16/20   [provider]  cholecalciferol (VITAMIN D3) 25 MCG (1000 UNIT) tablet Take 1,000 Units by mouth daily.    [provider]  citalopram (CELEXA) 10 MG tablet Take 10 mg by mouth daily. 09/15/22   [provider]  fluticasone (FLONASE) 50 MCG/ACT nasal spray Place 2 sprays into both nostrils daily.    [provider]  guaiFENesin-dextromethorphan (ROBITUSSIN DM) 100-10 MG/5ML syrup Take 10 mLs by mouth every 4 (four) hours as needed for cough (chest congestion). 06/02/23   Juliet Rude, PA-C  lidocaine (LIDODERM) 5 % Place 1 patch onto the skin daily. Remove & Discard patch within 12 hours or as directed by MD 06/02/23   Juliet Rude, PA-C  methocarbamol (ROBAXIN) 500 MG tablet Take 1 tablet (500 mg total) by mouth every 8 (eight)  hours as needed for muscle spasms (pain from rib fractues). 06/02/23   Juliet Rude, PA-C  mirtazapine (REMERON) 15 MG tablet Take 15 mg by mouth at bedtime. 05/29/16   [provider]  Multiple Vitamin (MULTIVITAMIN WITH MINERALS) TABS tablet Take 1 tablet by mouth daily.    [provider]  Multiple Vitamins-Minerals (PRESERVISION AREDS 2 PO) Take 1 tablet by mouth in the morning and at bedtime.    [provider]  omeprazole (PRILOSEC) 40 MG capsule Take 40 mg by mouth daily.    [provider]      Allergies    Codeine, Fentanyl, Morphine, and Morphine and codeine    Review of Systems   Review of Systems  Constitutional:  Negative for chills and fever.  Respiratory:  Negative for shortness of breath.   Cardiovascular:  Positive for chest pain (Currently resolved).  Gastrointestinal:  Negative for abdominal pain, nausea and vomiting.  Neurological:  Negative for light-headedness.  All other systems reviewed and are negative.   Physical Exam Updated Vital Signs BP 138/72   Pulse 72   Temp 98.3 F (36.8 C)   Resp 18   Ht 5\' 3"  (1.6 m)   Wt 78 kg   SpO2 94%   BMI 30.46 kg/m  Physical Exam Vitals and nursing note reviewed.  Constitutional:      General: She is not in acute distress.    Appearance: Normal appearance. She is not ill-appearing.  HENT:     Head: Normocephalic and atraumatic.     Nose: Nose normal.  Eyes:     Conjunctiva/sclera: Conjunctivae normal.  Cardiovascular:     Rate and Rhythm: Normal rate and regular rhythm.  Pulmonary:     Effort: Pulmonary effort is normal. No respiratory distress.     Breath sounds: Normal breath sounds. No wheezing or rales.  Musculoskeletal:        General: No deformity. Normal range of motion.     Cervical back: Normal range of motion.  Skin:    Findings: No rash.  Neurological:     Mental Status: She is alert.     ED Results / Procedures / Treatments   Labs (all labs ordered  are listed, but only abnormal results are displayed) Labs Reviewed  BASIC METABOLIC PANEL - Abnormal; Notable for the following components:      Result Value   Glucose, Bld 102 (*)    Calcium 8.6 (*)    All other components within normal limits  CBC WITH DIFFERENTIAL/PLATELET  MAGNESIUM  TROPONIN I (HIGH SENSITIVITY)    EKG EKG Interpretation Date/Time:  Wednesday October 07 2023 06:30:44 EDT Ventricular Rate:  71 PR Interval:  168 QRS Duration:  87 QT Interval:  399 QTC Calculation: 434 R Axis:   -44  Text Interpretation: Sinus or ectopic atrial rhythm Inferior infarct, old  Confirmed by Gerhard Munch [phone]) on 10/07/2023 7:13:39 AM  Radiology No results found.  Procedures Procedures    Medications Ordered in ED Medications - No data to display  ED Course/ Medical Decision Making/ A&P                                 Medical Decision Making Amount and/or Complexity of Data Reviewed Labs: ordered. Radiology: ordered.  Risk OTC drugs. Prescription drug management.   Medical Decision Making / ED Course   This patient presents to the ED for concern of chest pain, this involves an extensive number of treatment options, and is a complaint that carries with it a high risk of complications and morbidity.  The differential diagnosis includes ACS, pneumonia, PE, MSK pain, GERD, dissection  MDM: 87 year old female presents today for concern of chest pain that started 45 minutes prior to arrival.  Did improve after receiving multiple triglycerides as well as aspirin and route.  Currently she is asymptomatic.  Pain did not radiate anywhere.  ACS workup was ordered.  During the ED course patient became delusional and disoriented.  She does have history of dementia.  I discussed this with patient's son and daughter-in-law and they state this is not unusual for her.  She does not have any asymmetric weakness, facial drooping.  They state her speech changes where she  constantly repeats things is normal.  CT head was obtained however the remaining of the altered mental status blood work was canceled as this is her baseline according to family.  UA was obtained which showed evidence of UTI.  Rocephin was given.  She later developed chest pain again.  This was reproducible.  Tylenol was given.  Rocephin given for UTI.  Haldol was given due to the bit of agitation.  Discharged in stable condition.  Return precaution discussed.  Son was updated.  They are in agreement with plan.  Patient does have moderate heart score.  However low suspicion for ACS given troponin normal x 2.  Chest x-ray without acute  cardiopulmonary process.  EKG without acute ischemic change.  Remainder of the workup is reassuring.  CBC is without leukocytosis or anemia.  BMP without renal insufficiency or abnormal electrolytes.  Magnesium 2.2.  CT head without acute intracranial finding. Patient was maintained on a cardiac monitor and remained with normal sinus rhythm.  Lab Tests: -I ordered, reviewed, and interpreted labs.   The pertinent results include:   Labs Reviewed  BASIC METABOLIC PANEL - Abnormal; Notable for the following components:      Result Value   Glucose, Bld 102 (*)    Calcium 8.6 (*)    All other components within normal limits  CBC WITH DIFFERENTIAL/PLATELET  MAGNESIUM  TROPONIN I (HIGH SENSITIVITY)      EKG  EKG Interpretation Date/Time:  Wednesday October 07 2023 06:30:44 EDT Ventricular Rate:  71 PR Interval:  168 QRS Duration:  87 QT Interval:  399 QTC Calculation: 434 R Axis:   -44  Text Interpretation: Sinus or ectopic atrial rhythm Inferior infarct, old  Confirmed by Gerhard Munch [phone]) on 10/07/2023 7:13:39 AM         Imaging Studies ordered: I ordered imaging studies including chest x-ray, CT head without contrast I independently visualized and interpreted imaging. I agree with the radiologist interpretation   Medicines ordered and  prescription drug management: No orders of the defined types were placed in this encounter.   -I have reviewed the patients home medicines and have made adjustments as needed  Reevaluation: After the interventions noted above, I reevaluated the patient and found that they have :improved  Co morbidities that complicate the patient evaluation  Past Medical History:  Diagnosis Date   Anxiety and depression    Closed left femoral fracture (HCC)    History of vertebral fracture    from fall in September 2016   Hypertension    Insomnia    Macular degeneration    Memory loss    Parkinsonism    Solitary kidney       Dispostion: Patient discharged in stable condition.  Return precaution discussed.  Patient voices understanding and is in agreement with plan.  Final Clinical Impression(s) / ED Diagnoses Final diagnoses:  Atypical chest pain  Urinary tract infection without hematuria, site unspecified    Rx / DC Orders ED Discharge Orders          Ordered    Ambulatory referral to Cardiology       Comments: If you have not heard from the Cardiology office within the next 72 hours please call [phone].   10/07/23 1317    cephALEXin (KEFLEX) 500 MG capsule  4 times daily        10/07/23 1317              Marita Kansas, PA-C 10/07/23 1317    Gerhard Munch, MD 10/08/23 2025

## 2023-10-08 LAB — URINE CULTURE: Culture: >=100000 — AB

## 2023-10-09 ENCOUNTER — Telehealth (HOSPITAL_BASED_OUTPATIENT_CLINIC_OR_DEPARTMENT_OTHER): Payer: Self-pay

## 2023-10-09 NOTE — Progress Notes (Signed)
ED Antimicrobial Stewardship Positive Culture Follow Up   Emily Escobar is an 87 y.o. female who presented to [facility] on 10/07/2023 with a chief complaint of  Chief Complaint  Patient presents with   Chest Pain    Recent Results (from the past 720 hour(s))  SARS Coronavirus 2 by RT PCR (hospital order, performed in [facility] hospital lab) *cepheid single result test* Anterior Nasal Swab     Status: None   Collection Time: 09/19/23  2:08 AM   Specimen: Anterior Nasal Swab  Result Value Ref Range Status   SARS Coronavirus 2 by RT PCR NEGATIVE NEGATIVE Final    Comment: Performed at [facility], [address] [place], [place] [address]  Urine Culture     Status: Abnormal   Collection Time: 10/07/23 11:01 AM   Specimen: Urine, Random  Result Value Ref Range Status   Specimen Description URINE, RANDOM  Final   Special Requests URINE, CLEAN CATCH  Final   Culture (A)  Final    >=100,000 COLONIES/mL AEROCOCCUS SPECIES Standardized susceptibility testing for this organism is not available. Performed at [facility], [address] [place], [place] [address]    Report Status 10/08/2023 FINAL  Final    No urinary symptoms or need for ASB treatment. Please call patient to discontinue keflex prescription.   ED Provider: Aline August, PA    [name] 10/09/2023, 8:43 AM Clinical Pharmacist Monday - Friday phone -  [phone] Saturday - Sunday phone - [phone]

## 2023-10-09 NOTE — Telephone Encounter (Signed)
Post ED Visit - Positive Culture Follow-up: Successful Patient Follow-Up  Culture assessed and recommendations reviewed by:  [x]  Ivery Quale, Pharm.D. []  Celedonio Miyamoto, Pharm.D., BCPS AQ-ID []  Garvin Fila, Pharm.D., BCPS []  Georgina Pillion, Pharm.D., BCPS []  [place], [address].D., BCPS, AAHIVP []  Estella Husk, Pharm.D., BCPS, AAHIVP []  Lysle Pearl, PharmD, BCPS []  Phillips Climes, PharmD, BCPS []  Agapito Games, PharmD, BCPS []  Verlan Friends, PharmD  Positive urine culture  []  Patient discharged without antimicrobial prescription and treatment is now indicated []  Organism is resistant to prescribed ED discharge antimicrobial []  Patient with positive blood cultures  Pt no longers needs abx, call to Discontinue  Changes discussed with ED provider: Lorin Roemhildt PA-C  Called to Dartha Lodge, pt daughter-in-law. She will notify pt assisted living [place] [facility].   Contacted patient daughter-in-law, date 10/09/23, time 8:52 am   Sandria Senter 10/09/2023, 8:55 AM

## 2023-10-11 ENCOUNTER — Emergency Department (HOSPITAL_BASED_OUTPATIENT_CLINIC_OR_DEPARTMENT_OTHER)
Admission: EM | Admit: 2023-10-11 | Discharge: 2023-10-11 | Disposition: A | Discharge status: Home / Self Care | Payer: Medicare HMO | Attending: Emergency Medicine | Admitting: Emergency Medicine

## 2023-10-11 ENCOUNTER — Emergency Department (HOSPITAL_BASED_OUTPATIENT_CLINIC_OR_DEPARTMENT_OTHER): Payer: Medicare HMO

## 2023-10-11 ENCOUNTER — Encounter (HOSPITAL_BASED_OUTPATIENT_CLINIC_OR_DEPARTMENT_OTHER): Payer: Self-pay

## 2023-10-11 DIAGNOSIS — S01111A Laceration without foreign body of right eyelid and periocular area, initial encounter: Secondary | ICD-10-CM | POA: Diagnosis not present

## 2023-10-11 DIAGNOSIS — S065X0A Traumatic subdural hemorrhage without loss of consciousness, initial encounter: Secondary | ICD-10-CM | POA: Diagnosis not present

## 2023-10-11 DIAGNOSIS — S065XAA Traumatic subdural hemorrhage with loss of consciousness status unknown, initial encounter: Secondary | ICD-10-CM

## 2023-10-11 DIAGNOSIS — W1839XA Other fall on same level, initial encounter: Secondary | ICD-10-CM | POA: Diagnosis not present

## 2023-10-11 DIAGNOSIS — S0990XA Unspecified injury of head, initial encounter: Secondary | ICD-10-CM | POA: Diagnosis present

## 2023-10-11 MED ORDER — LIDOCAINE-EPINEPHRINE (PF) 2 %-1:200000 IJ SOLN
10.0000 mL | Freq: Once | INTRAMUSCULAR | Status: AC
  Administered 2023-10-11: 10 mL via INTRADERMAL
  Filled 2023-10-11: qty 20

## 2023-10-11 NOTE — ED Triage Notes (Signed)
Staff at [facility] witnessed pt. To "slowly fall" while attempting to get up from her toilet just p.t.a. she has no c/o pain and has a small lac. At lat. Right eyebrow area. She is awake, alert and confused at her baseline according to their staff.

## 2023-10-11 NOTE — Discharge Instructions (Signed)
Your CT scan shows some bleeding inside your skull.  Likely the neurosurgeon does not think there is a reason for acute intervention.  Please follow-up with your family doctor in the office.  Please return for change from your mental baseline persistent vomiting.  Sudden worsening headache.  Return for redness drainage or if you get a fever.  The sutures that were used are dissolvable that should dissolve between day 3 and day 5.  If they are still there after day 7 then you can gently plucked them out with tweezers.  The area can get wet but not fully immersed underwater.  No scrubbing.  If you really want to clean it you can apply a half-and-half hydrogen peroxide solution with water on a Q-tip.  You can apply an ointment a couple times a day this could be as simple as Vaseline but could also be an antibiotic ointment if you wish.  Once it is healed please try to avoid prolonged sun exposure use sunscreen.  Gells that have silicone antigens have been shown to reduce scarring in some research.

## 2023-10-11 NOTE — ED Provider Notes (Signed)
[place] EMERGENCY DEPARTMENT AT [facility] Provider Note   CSN: [id number] Arrival date & time: 10/11/23  4098     History  No chief complaint on file.   Emily Escobar is a 87 y.o. female.  87 yo F with a chief complaints of a fall.  She tells me that the ground was uneven and that made her fall.  Per EMS report the patient was in the bathroom and her caregiver had left her to do something and when she turned around the patient had fallen to the ground.  Seems that she struck the right side of her head against the wall.  They did not think it was a very significant fall.  Did suffer a cut over the right eye.  She has no complaints of pain anywhere.        Home Medications Prior to Admission medications   Medication Sig Start Date End Date Taking? Authorizing Provider  acetaminophen (TYLENOL) 500 MG tablet Take 1,000 mg by mouth in the morning, at noon, and at bedtime.    [provider]  atenolol (TENORMIN) 50 MG tablet Take 50 mg by mouth daily. 07/02/16   [provider]  atorvastatin (LIPITOR) 20 MG tablet Take 20 mg by mouth daily. 08/16/20   [provider]  cephALEXin (KEFLEX) 500 MG capsule Take 1 capsule (500 mg total) by mouth 4 (four) times daily for 7 days. 10/07/23 10/14/23  Marita Kansas, PA-C  cholecalciferol (VITAMIN D3) 25 MCG (1000 UNIT) tablet Take 1,000 Units by mouth daily.    [provider]  citalopram (CELEXA) 10 MG tablet Take 10 mg by mouth daily. 09/15/22   [provider]  divalproex (DEPAKOTE SPRINKLE) 125 MG capsule Take 125 mg by mouth 3 (three) times daily. 09/28/23   [provider]  fluticasone (FLONASE) 50 MCG/ACT nasal spray Place 2 sprays into both nostrils daily.    [provider]  guaiFENesin-dextromethorphan (ROBITUSSIN DM) 100-10 MG/5ML syrup Take 10 mLs by mouth every 4 (four) hours as needed for cough (chest congestion). 06/02/23   Juliet Rude, PA-C  lidocaine  (LIDODERM) 5 % Place 1 patch onto the skin daily. Remove & Discard patch within 12 hours or as directed by MD 06/02/23   Juliet Rude, PA-C  methocarbamol (ROBAXIN) 500 MG tablet Take 1 tablet (500 mg total) by mouth every 8 (eight) hours as needed for muscle spasms (pain from rib fractues). 06/02/23   Juliet Rude, PA-C  mirtazapine (REMERON) 15 MG tablet Take 15 mg by mouth at bedtime. 05/29/16   [provider]  Multiple Vitamin (MULTIVITAMIN WITH MINERALS) TABS tablet Take 1 tablet by mouth daily.    [provider]  Multiple Vitamins-Minerals (PRESERVISION AREDS 2 PO) Take 1 tablet by mouth in the morning and at bedtime.    [provider]  omeprazole (PRILOSEC) 40 MG capsule Take 40 mg by mouth daily.    [provider]      Allergies    Codeine, Fentanyl, Morphine, and Morphine and codeine    Review of Systems   Review of Systems  Physical Exam Updated Vital Signs BP (!) 137/57 (BP Location: Right Arm)   Pulse 87   Temp 98 F (36.7 C) (Oral)   Resp 18   SpO2 96%  Physical Exam Vitals and nursing note reviewed.  Constitutional:      General: She is not in acute distress.    Appearance: She is well-developed. She is not diaphoretic.  HENT:     Head: Normocephalic.     Comments: Laceration adjacent to the right eyebrow just lateral to the area. Eyes:     Pupils: Pupils are equal, round, and reactive to light.  Cardiovascular:     Rate and Rhythm: Normal rate and regular rhythm.     Heart sounds: No murmur heard.    No friction rub. No gallop.  Pulmonary:     Effort: Pulmonary effort is normal.     Breath sounds: No wheezing or rales.  Abdominal:     General: There is no distension.     Palpations: Abdomen is soft.     Tenderness: There is no abdominal tenderness.  Musculoskeletal:        General: No tenderness.     Cervical back: Normal range of motion and neck supple.  Skin:    General: Skin is warm and dry.  Neurological:      Mental Status: She is alert and oriented to person, place, and time.  Psychiatric:        Behavior: Behavior normal.     ED Results / Procedures / Treatments   Labs (all labs ordered are listed, but only abnormal results are displayed) Labs Reviewed - No data to display  EKG None  Radiology CT Head Wo Contrast  Result Date: 10/11/2023 CLINICAL DATA:  Head trauma.  Fall getting up from the commode. EXAM: CT HEAD WITHOUT CONTRAST TECHNIQUE: Contiguous axial images were obtained from the base of the skull through the vertex without intravenous contrast. RADIATION DOSE REDUCTION: This exam was performed according to the departmental dose-optimization program which includes automated exposure control, adjustment of the mA and/or kV according to patient size and/or use of iterative reconstruction technique. COMPARISON:  CT head without contrast 10/07/2023 FINDINGS: Brain: Acute right subdural hematoma measuring up to 9 mm on coronal images is present over the right convexity. Creates mass effect with some effacement of the sulci. No midline shift is present. Mild mass effect on the right lateral ventricle is present. No hydrocephalus present. Moderate generalized atrophy and white matter disease is otherwise stable. Deep brain nuclei are within normal limits. The brainstem and cerebellum are within normal limits. Midline structures are within normal limits. Vascular: No hyperdense vessel or unexpected calcification. Skull: A right supraorbital scalp hematoma is present without underlying fracture. No foreign body is present. Calvarium is intact. No focal lytic or blastic lesions are present. Sinuses/Orbits: Bilateral maxillary antrostomies are present. Partial ethmoidectomies are noted bilaterally. No active sinus disease is present. 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 The mastoid air cells are clear. IMPRESSION: 1. Acute right subdural hematoma measuring up to 9 mm on coronal  images over the right convexity. 2. Mass effect with some effacement of the sulci. No midline shift. 3. Right supraorbital scalp hematoma without underlying fracture. 4. Stable moderate generalized atrophy and white matter disease. This likely reflects the sequela of chronic microvascular ischemia. Critical Value/emergent results were called by telephone at the time of interpretation on 10/11/2023 at 11:23 am to provider Janiene Aarons , who verbally acknowledged these results. Electronically Signed   By: Marin Roberts M.D.   On: 10/11/2023 11:25    Procedures .Marland KitchenLaceration Repair  Date/Time: 10/11/2023 10:44 AM  Performed by: Melene Plan, DO Authorized by: Melene Plan, DO   Consent:    Consent obtained:  Verbal   Consent given by:  Patient   Risks, benefits, and alternatives were discussed: yes  Risks discussed:  Infection, pain, poor cosmetic result and poor wound healing   Alternatives discussed:  No treatment Universal protocol:    Procedure explained and questions answered to patient or proxy's satisfaction: yes     Imaging studies available: yes (CT)     Immediately prior to procedure, a time out was called: yes     Patient identity confirmed:  Verbally with patient Anesthesia:    Anesthesia method:  Local infiltration   Local anesthetic:  Lidocaine 2% WITH epi Laceration details:    Location:  Face   Face location:  R eyebrow   Length (cm):  5.2 Pre-procedure details:    Preparation:  Patient was prepped and draped in usual sterile fashion Exploration:    Hemostasis achieved with:  Epinephrine and direct pressure   Imaging obtained comment:  CT   Imaging outcome: foreign body not noted     Wound exploration: entire depth of wound visualized     Wound extent: no underlying fracture   Treatment:    Area cleansed with:  Chlorhexidine   Amount of cleaning:  Standard   Irrigation solution:  Sterile saline   Irrigation volume:  50   Irrigation method:  Pressure wash    Visualized foreign bodies/material removed: no     Debridement:  None   Undermining:  None   Scar revision: no   Skin repair:    Repair method:  Sutures   Suture size:  5-0   Suture material:  Fast-absorbing gut   Suture technique:  Simple interrupted   Number of sutures:  5 Approximation:    Approximation:  Close Repair type:    Repair type:  Simple Post-procedure details:    Dressing:  Antibiotic ointment and adhesive bandage   Procedure completion:  Tolerated well, no immediate complications .Critical Care  Performed by: Melene Plan, DO Authorized by: Melene Plan, DO   Critical care provider statement:    Critical care time (minutes):  35   Critical care time was exclusive of:  Separately billable procedures and treating other patients   Critical care was time spent personally by me on the following activities:  Development of treatment plan with patient or surrogate, discussions with consultants, evaluation of patient's response to treatment, examination of patient, ordering and review of laboratory studies, ordering and review of radiographic studies, ordering and performing treatments and interventions, pulse oximetry, re-evaluation of patient's condition and review of old charts   Care discussed with: admitting provider       Medications Ordered in ED Medications  lidocaine-EPINEPHrine (XYLOCAINE W/EPI) 2 %-1:200000 (PF) injection 10 mL (10 mLs Intradermal Given by Other 10/11/23 1115)    ED Course/ Medical Decision Making/ A&P                                 Medical Decision Making Amount and/or Complexity of Data Reviewed Radiology: ordered.  Risk Prescription drug management.   87 yo F with a chief complaints of a fall from standing.  On my record review this is the patient's 10th fall in the past 6 months that she was seen in an ED setting.  She had seen neurology and there was some concern for increased instability.  Will obtain CT imaging of the head.  Will  suture the wound at bedside.  On my record review, patient's tetanus was updated in June of this year.  I was notified by nursing that the  patient had climbed out of bed and unfortunately fallen.  On my exam she has a new hematoma to the occiput.  I do not appreciate any other obvious new areas of injury.  Patient denies any pain.  CT of the head with concern for 9 mm subdural.  I discussed this with the radiologist.  Will discuss with neurosurgery.  I discussed the case with Leo Grosser, on-call for neurosurgery.  No need for acute intervention or prolonged monitoring or repeat imaging..  Will discharge back to her facility.  11:37 AM:  I have discussed the diagnosis/risks/treatment options with the patient.  Evaluation and diagnostic testing in the emergency department does not suggest an emergent condition requiring admission or immediate intervention beyond what has been performed at this time.  They will follow up with PCP. We also discussed returning to the ED immediately if new or worsening sx occur. We discussed the sx which are most concerning (e.g., sudden worsening pain, fever, inability to tolerate by mouth) that necessitate immediate return. Medications administered to the patient during their visit and any new prescriptions provided to the patient are listed below.  Medications given during this visit Medications  lidocaine-EPINEPHrine (XYLOCAINE W/EPI) 2 %-1:200000 (PF) injection 10 mL (10 mLs Intradermal Given by Other 10/11/23 1115)     The patient appears reasonably screen and/or stabilized for discharge and I doubt any other medical condition or other [facility] requiring further screening, evaluation, or treatment in the ED at this time prior to discharge.          Final Clinical Impression(s) / ED Diagnoses Final diagnoses:  SDH (subdural hematoma) [facility])    Rx / DC Orders ED Discharge Orders     None         Melene Plan, DO 10/11/23 1138

## 2023-10-11 NOTE — ED Notes (Signed)
After coming out of another pt's room, this RN was informed that Ms. Burford had gotten out of her bed in room 9 and walked to the door at the hallway where she then fell. Pt was discovered by radiology tech who then informed nursing staff. Pt was assisted by nursing staff back to her bed. Pt's bed was then placed beside nursing station for closer monitoring. Pt continued trying to get out of the bed. RT currently sitting at the bedside with pt.

## 2023-10-11 NOTE — ED Notes (Signed)
Pt. Was observed being on the floor in the hall near her room (room 9), having been discovered by one of our imagers, [employer]. Dr. Adela Lank was immediately notified and examined pt. It was determined to proceed with the existing treatment plan. Her head CT, which was ordered previously, had not been completed before the fall. We then proceeded to perform her head CT after she was assisted to her feet in the presence of Dr. Adela Lank. Pt. Had no complaints at this time, and was able to support her weight while standing with 2 assistants. She was taken to CT per w/c. Upon her return from CT we placed her at the nurses' station so as to provide constant observation/assistance.

## 2023-10-13 ENCOUNTER — Ambulatory Visit: Payer: Medicare HMO | Admitting: Neurology

## 2023-10-22 ENCOUNTER — Encounter (HOSPITAL_BASED_OUTPATIENT_CLINIC_OR_DEPARTMENT_OTHER): Payer: Self-pay | Admitting: *Deleted

## 2023-10-22 ENCOUNTER — Other Ambulatory Visit: Payer: Self-pay

## 2023-10-22 ENCOUNTER — Emergency Department (HOSPITAL_BASED_OUTPATIENT_CLINIC_OR_DEPARTMENT_OTHER): Payer: Medicare HMO

## 2023-10-22 ENCOUNTER — Inpatient Hospital Stay (HOSPITAL_COMMUNITY): Payer: Medicare HMO

## 2023-10-22 ENCOUNTER — Emergency Department (HOSPITAL_BASED_OUTPATIENT_CLINIC_OR_DEPARTMENT_OTHER): Payer: Medicare HMO | Admitting: Radiology

## 2023-10-22 ENCOUNTER — Inpatient Hospital Stay (HOSPITAL_BASED_OUTPATIENT_CLINIC_OR_DEPARTMENT_OTHER)
Admission: EM | Admit: 2023-10-22 | Discharge: 2023-11-04 | DRG: 025 | Disposition: A | Discharge status: Skilled Nursing Facility | Payer: Medicare HMO | Source: Skilled Nursing Facility | Attending: Neurological Surgery | Admitting: Neurological Surgery

## 2023-10-22 DIAGNOSIS — F419 Anxiety disorder, unspecified: Secondary | ICD-10-CM | POA: Diagnosis present

## 2023-10-22 DIAGNOSIS — Z9181 History of falling: Secondary | ICD-10-CM | POA: Diagnosis not present

## 2023-10-22 DIAGNOSIS — F32A Depression, unspecified: Secondary | ICD-10-CM | POA: Diagnosis present

## 2023-10-22 DIAGNOSIS — Z90710 Acquired absence of both cervix and uterus: Secondary | ICD-10-CM | POA: Diagnosis not present

## 2023-10-22 DIAGNOSIS — R402142 Coma scale, eyes open, spontaneous, at arrival to emergency department: Secondary | ICD-10-CM | POA: Diagnosis present

## 2023-10-22 DIAGNOSIS — G20C Parkinsonism, unspecified: Secondary | ICD-10-CM | POA: Diagnosis present

## 2023-10-22 DIAGNOSIS — S065X0A Traumatic subdural hemorrhage without loss of consciousness, initial encounter: Principal | ICD-10-CM | POA: Diagnosis present

## 2023-10-22 DIAGNOSIS — F02811 Dementia in other diseases classified elsewhere, unspecified severity, with agitation: Secondary | ICD-10-CM | POA: Diagnosis present

## 2023-10-22 DIAGNOSIS — R402242 Coma scale, best verbal response, confused conversation, at arrival to emergency department: Secondary | ICD-10-CM | POA: Diagnosis present

## 2023-10-22 DIAGNOSIS — G8929 Other chronic pain: Secondary | ICD-10-CM | POA: Diagnosis present

## 2023-10-22 DIAGNOSIS — Z751 Person awaiting admission to adequate facility elsewhere: Secondary | ICD-10-CM | POA: Diagnosis not present

## 2023-10-22 DIAGNOSIS — S0011XA Contusion of right eyelid and periocular area, initial encounter: Secondary | ICD-10-CM | POA: Diagnosis present

## 2023-10-22 DIAGNOSIS — I10 Essential (primary) hypertension: Secondary | ICD-10-CM | POA: Diagnosis present

## 2023-10-22 DIAGNOSIS — R402342 Coma scale, best motor response, flexion withdrawal, at arrival to emergency department: Secondary | ICD-10-CM | POA: Diagnosis present

## 2023-10-22 DIAGNOSIS — Y92099 Unspecified place in other non-institutional residence as the place of occurrence of the external cause: Secondary | ICD-10-CM

## 2023-10-22 DIAGNOSIS — Z885 Allergy status to narcotic agent status: Secondary | ICD-10-CM

## 2023-10-22 DIAGNOSIS — I6203 Nontraumatic chronic subdural hemorrhage: Secondary | ICD-10-CM | POA: Diagnosis not present

## 2023-10-22 DIAGNOSIS — Z8249 Family history of ischemic heart disease and other diseases of the circulatory system: Secondary | ICD-10-CM | POA: Diagnosis not present

## 2023-10-22 DIAGNOSIS — W1830XA Fall on same level, unspecified, initial encounter: Secondary | ICD-10-CM | POA: Diagnosis present

## 2023-10-22 DIAGNOSIS — Z96642 Presence of left artificial hip joint: Secondary | ICD-10-CM | POA: Diagnosis present

## 2023-10-22 DIAGNOSIS — S065XAA Traumatic subdural hemorrhage with loss of consciousness status unknown, initial encounter: Principal | ICD-10-CM | POA: Diagnosis present

## 2023-10-22 DIAGNOSIS — S06A0XA Traumatic brain compression without herniation, initial encounter: Secondary | ICD-10-CM | POA: Diagnosis present

## 2023-10-22 DIAGNOSIS — H3530 Unspecified macular degeneration: Secondary | ICD-10-CM | POA: Diagnosis present

## 2023-10-22 DIAGNOSIS — Z79899 Other long term (current) drug therapy: Secondary | ICD-10-CM | POA: Diagnosis not present

## 2023-10-22 DIAGNOSIS — Z8781 Personal history of (healed) traumatic fracture: Secondary | ICD-10-CM | POA: Diagnosis not present

## 2023-10-22 DIAGNOSIS — R4182 Altered mental status, unspecified: Secondary | ICD-10-CM | POA: Diagnosis present

## 2023-10-22 HISTORY — DX: Unspecified dementia, unspecified severity, without behavioral disturbance, psychotic disturbance, mood disturbance, and anxiety: F03.90

## 2023-10-22 LAB — BASIC METABOLIC PANEL
Anion gap: 6 (ref 5–15)
BUN: 16 mg/dL (ref 8–23)
CO2: 30 mmol/L (ref 22–32)
Calcium: 9.3 mg/dL (ref 8.9–10.3)
Chloride: 104 mmol/L (ref 98–111)
Creatinine, Ser: 0.59 mg/dL (ref 0.44–1.00)
GFR, Estimated: >60 mL/min (ref >60)
Glucose, Bld: 105 mg/dL — ABNORMAL HIGH (ref 70–99)
Potassium: 3.8 mmol/L (ref 3.5–5.1)
Sodium: 140 mmol/L (ref 135–145)

## 2023-10-22 LAB — CBC WITH DIFFERENTIAL/PLATELET
Abs Immature Granulocytes: 0.03 10*3/uL (ref 0.00–0.07)
Basophils Absolute: 0 10*3/uL (ref 0.0–0.1)
Basophils Relative: 0 %
Eosinophils Absolute: 0.1 10*3/uL (ref 0.0–0.5)
Eosinophils Relative: 1 %
HCT: 41.3 % (ref 36.0–46.0)
Hemoglobin: 13.3 g/dL (ref 12.0–15.0)
Immature Granulocytes: 0 %
Lymphocytes Relative: 8 %
Lymphs Abs: 0.7 10*3/uL (ref 0.7–4.0)
MCH: 28.7 pg (ref 26.0–34.0)
MCHC: 32.2 g/dL (ref 30.0–36.0)
MCV: 89 fL (ref 80.0–100.0)
Monocytes Absolute: 0.6 10*3/uL (ref 0.1–1.0)
Monocytes Relative: 7 %
Neutro Abs: 7.2 10*3/uL (ref 1.7–7.7)
Neutrophils Relative %: 84 %
Platelets: 220 10*3/uL (ref 150–400)
RBC: 4.64 MIL/uL (ref 3.87–5.11)
RDW: 13 % (ref 11.5–15.5)
WBC: 8.6 10*3/uL (ref 4.0–10.5)
nRBC: 0 % (ref 0.0–0.2)

## 2023-10-22 LAB — PROTIME-INR
INR: 1 (ref 0.8–1.2)
Prothrombin Time: 13.7 s (ref 11.4–15.2)

## 2023-10-22 MED ORDER — CITALOPRAM HYDROBROMIDE 10 MG PO TABS
10.0000 mg | ORAL_TABLET | Freq: Every day | ORAL | Status: DC
  Administered 2023-10-23 – 2023-11-04 (×12): 10 mg via ORAL
  Filled 2023-10-22 (×12): qty 1

## 2023-10-22 MED ORDER — SODIUM CHLORIDE 0.9 % IV SOLN: INTRAVENOUS | Status: AC

## 2023-10-22 MED ORDER — PANTOPRAZOLE SODIUM 40 MG PO TBEC
80.0000 mg | DELAYED_RELEASE_TABLET | Freq: Every day | ORAL | Status: DC
  Administered 2023-10-23 – 2023-11-01 (×9): 80 mg via ORAL
  Filled 2023-10-22 (×9): qty 2

## 2023-10-22 MED ORDER — LEVETIRACETAM IN NACL 500 MG/100ML IV SOLN
500.0000 mg | Freq: Two times a day (BID) | INTRAVENOUS | Status: DC
  Administered 2023-10-22 – 2023-10-30 (×15): 500 mg via INTRAVENOUS
  Filled 2023-10-22 (×15): qty 100

## 2023-10-22 MED ORDER — LEVETIRACETAM IN NACL 1000 MG/100ML IV SOLN
1000.0000 mg | Freq: Once | INTRAVENOUS | Status: AC
Start: 2023-10-22 — End: 2023-10-22
  Administered 2023-10-22: 1000 mg via INTRAVENOUS
  Filled 2023-10-22: qty 100

## 2023-10-22 MED ORDER — LOSARTAN POTASSIUM 25 MG PO TABS
25.0000 mg | ORAL_TABLET | Freq: Every day | ORAL | Status: DC
  Administered 2023-10-23 – 2023-11-04 (×13): 25 mg via ORAL
  Filled 2023-10-22 (×13): qty 1

## 2023-10-22 MED ORDER — DEXAMETHASONE SODIUM PHOSPHATE 4 MG/ML IJ SOLN
4.0000 mg | Freq: Three times a day (TID) | INTRAMUSCULAR | Status: DC
  Administered 2023-10-22 – 2023-10-31 (×27): 4 mg via INTRAVENOUS
  Filled 2023-10-22 (×27): qty 1

## 2023-10-22 MED ORDER — ACETAMINOPHEN 500 MG PO TABS
1000.0000 mg | ORAL_TABLET | Freq: Four times a day (QID) | ORAL | Status: DC | PRN
  Administered 2023-10-25 – 2023-11-02 (×8): 1000 mg via ORAL
  Filled 2023-10-22 (×9): qty 2

## 2023-10-22 MED ORDER — DIVALPROEX SODIUM 125 MG PO CSDR
125.0000 mg | DELAYED_RELEASE_CAPSULE | Freq: Three times a day (TID) | ORAL | Status: DC
  Administered 2023-10-23 – 2023-11-04 (×36): 125 mg via ORAL
  Filled 2023-10-22 (×36): qty 1

## 2023-10-22 MED ORDER — HYDRALAZINE HCL 20 MG/ML IJ SOLN
5.0000 mg | Freq: Once | INTRAMUSCULAR | Status: DC
  Filled 2023-10-22: qty 1

## 2023-10-22 MED ORDER — ATENOLOL 25 MG PO TABS
50.0000 mg | ORAL_TABLET | Freq: Every day | ORAL | Status: DC
  Administered 2023-10-23 – 2023-11-04 (×13): 50 mg via ORAL
  Filled 2023-10-22 (×13): qty 2

## 2023-10-22 MED ORDER — ATORVASTATIN CALCIUM 10 MG PO TABS
20.0000 mg | ORAL_TABLET | Freq: Every day | ORAL | Status: DC
  Administered 2023-10-23 – 2023-11-04 (×12): 20 mg via ORAL
  Filled 2023-10-22 (×12): qty 2

## 2023-10-22 MED ORDER — METHOCARBAMOL 1000 MG/10ML IJ SOLN: 500.0000 mg | Freq: Four times a day (QID) | INTRAMUSCULAR | Status: DC | PRN

## 2023-10-22 MED ORDER — HYDRALAZINE HCL 20 MG/ML IJ SOLN
5.0000 mg | Freq: Once | INTRAMUSCULAR | Status: AC
Start: 2023-10-22 — End: 2023-10-22
  Administered 2023-10-22: 5 mg via INTRAVENOUS
  Filled 2023-10-22: qty 1

## 2023-10-22 MED ORDER — MIRTAZAPINE 15 MG PO TABS
15.0000 mg | ORAL_TABLET | Freq: Every day | ORAL | Status: DC
  Administered 2023-10-23 – 2023-11-03 (×12): 15 mg via ORAL
  Filled 2023-10-22 (×12): qty 1

## 2023-10-22 NOTE — ED Triage Notes (Signed)
[name] [facility] as ED to ED tx from [facility] for worsening subdural with midline shift. Pt is GCS 14 at baseline. GCS 12 on arrival. Pt alert and following commands but speech incomprehensible.

## 2023-10-22 NOTE — ED Provider Notes (Signed)
[place] EMERGENCY DEPARTMENT AT [facility] Provider Note   CSN: [id number] Arrival date & time: 10/22/23  1404     History  Chief Complaint  Patient presents with   Marletta Lor    Emily Escobar is a 87 y.o. female.  HPI   87 year old female presents to the emergency department from [facility] nursing facility.  Report from EMS is that the patient had a witnessed mechanical fall today without head injury or LOC.  There is no family member or staff member with the patient.  She repetitively says the word no but otherwise is not conversational or oriented.  EMS had reported that she is altered at baseline and only alert to self.  Patient does have some old bruising around the right eye that is reported from a previous fall.  She reportedly has chronic back pain where Lidoderm patches placed but she cannot tell me if there is any new acute pain.  Home Medications Prior to Admission medications   Medication Sig Start Date End Date Taking? Authorizing Provider  losartan (COZAAR) 25 MG tablet Take 25 mg by mouth daily. 10/08/23  Yes [provider]  acetaminophen (TYLENOL) 500 MG tablet Take 1,000 mg by mouth in the morning, at noon, and at bedtime.    [provider]  atenolol (TENORMIN) 50 MG tablet Take 50 mg by mouth daily. 07/02/16   [provider]  atorvastatin (LIPITOR) 20 MG tablet Take 20 mg by mouth daily. 08/16/20   [provider]  cholecalciferol (VITAMIN D3) 25 MCG (1000 UNIT) tablet Take 1,000 Units by mouth daily.    [provider]  citalopram (CELEXA) 10 MG tablet Take 10 mg by mouth daily. 09/15/22   [provider]  divalproex (DEPAKOTE SPRINKLE) 125 MG capsule Take 125 mg by mouth 3 (three) times daily. 09/28/23   [provider]  fluticasone (FLONASE) 50 MCG/ACT nasal spray Place 2 sprays into both nostrils daily.    [provider]  guaiFENesin-dextromethorphan (ROBITUSSIN DM) 100-10 MG/5ML syrup  Take 10 mLs by mouth every 4 (four) hours as needed for cough (chest congestion). 06/02/23   Juliet Rude, PA-C  lidocaine (LIDODERM) 5 % Place 1 patch onto the skin daily. Remove & Discard patch within 12 hours or as directed by MD 06/02/23   Juliet Rude, PA-C  methocarbamol (ROBAXIN) 500 MG tablet Take 1 tablet (500 mg total) by mouth every 8 (eight) hours as needed for muscle spasms (pain from rib fractues). 06/02/23   Juliet Rude, PA-C  mirtazapine (REMERON) 15 MG tablet Take 15 mg by mouth at bedtime. 05/29/16   [provider]  Multiple Vitamin (MULTIVITAMIN WITH MINERALS) TABS tablet Take 1 tablet by mouth daily.    [provider]  Multiple Vitamins-Minerals (PRESERVISION AREDS 2 PO) Take 1 tablet by mouth in the morning and at bedtime.    [provider]  omeprazole (PRILOSEC) 40 MG capsule Take 40 mg by mouth daily.    [provider]      Allergies    Codeine, Fentanyl, Morphine, and Morphine and codeine    Review of Systems   Review of Systems  Unable to perform ROS: Mental status change    Physical Exam Updated Vital Signs BP (!) 173/74 (BP Location: Right Arm)   Pulse 63   Temp 97.7 F (36.5 C) (Oral)   Resp 16   Ht 5\' 3"  (1.6 m)   Wt 78 kg   SpO2 93%   BMI  30.46 kg/m  Physical Exam Vitals and nursing note reviewed.  HENT:     Head: Normocephalic.     Mouth/Throat:     Mouth: Mucous membranes are dry.  Eyes:     Comments: Ecchymosis at the right cheek and around the right eye  Cardiovascular:     Rate and Rhythm: Normal rate.  Pulmonary:     Effort: Pulmonary effort is normal. No respiratory distress.  Abdominal:     Palpations: Abdomen is soft.     Tenderness: There is no abdominal tenderness.     Comments: No bruising  Musculoskeletal:        General: No swelling or deformity.     Cervical back: No rigidity or tenderness.     Comments: Pelvis is stable  Skin:    General: Skin is warm.  Neurological:      Mental Status: She is alert. She is disoriented.     ED Results / Procedures / Treatments   Labs (all labs ordered are listed, but only abnormal results are displayed) Labs Reviewed  URINALYSIS, ROUTINE W REFLEX MICROSCOPIC  CBC WITH DIFFERENTIAL/PLATELET  BASIC METABOLIC PANEL  PROTIME-INR    EKG None  Radiology No results found.  Procedures .Critical Care  Performed by: Rozelle Logan, DO Authorized by: Rozelle Logan, DO   Critical care provider statement:    Critical care time (minutes):  75   Critical care time was exclusive of:  Separately billable procedures and treating other patients   Critical care was necessary to treat or prevent imminent or life-threatening deterioration of the following conditions:  CNS failure or compromise   Critical care was time spent personally by me on the following activities:  Development of treatment plan with patient or surrogate, discussions with consultants, evaluation of patient's response to treatment, examination of patient, ordering and review of laboratory studies, ordering and review of radiographic studies, ordering and performing treatments and interventions, pulse oximetry, re-evaluation of patient's condition and review of old charts   I assumed direction of critical care for this patient from another provider in my specialty: no       Medications Ordered in ED Medications - No data to display  ED Course/ Medical Decision Making/ A&P                                 Medical Decision Making Amount and/or Complexity of Data Reviewed Labs: ordered. Radiology: ordered.   87 year old female presents to the emergency department from facility with reported witnessed fall without head injury or LOC.  Had a recent fall with head injury but is currently reported to be in baseline mental status.  Patient is slightly hypertensive on arrival but otherwise normal vitals.  Patient repetitively says the word no for me and  otherwise is not conversational or reliable historian.  Chart review shows that a little over a week ago she was seen for a head injury, diagnosed with a subdural hematoma.  Neurosurgery was consulted and she was able to be discharged without need for any repeat imaging or observation.  Notes from around to this time showed that the patient was oriented and conversational.  Multiple attempts to reach the facility have been unsuccessful.  Nurse was able to talk on the phone with a family member who said they spoke with the patient earlier and she seemed to be conversational.  This sounds like a big change from her  current mental status.  Concern for fall and further head trauma.  We got in touch with the facility and are awaiting a callback from someone who is familiar with the patient to further describe the events of today.  CT head looks concerning for acute on chronic subdural with midline shift.  Plan for conversation with radiologist.  1820: Radiology confirms new findings 1. 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 6 mm of right-to-left midline  shift, new from the prior exam.  2. No acute facial bone fracture.  3. No acute fracture or traumatic listhesis in the cervical spine.   NSGY paged.  Spoke with Dr. Jake Samples from neurosurgery.  Reviewed CT and the patient's decline in mental status.  He recommends blood pressure control with goal systolic 160 as well as a gram of Keppra.  No indication for mannitol or hypertonic saline at this time.  Patient's mental status continues to decline, she is now nonverbal, pupils remain equal, eyes are crossing the midline, she sometimes follows command.  Currently protecting her airway.  Spoke with the daughter-in-law Eunice Blase who is on speaker phone with the son Sam.  Their numbers are listed in the chart.  They are aware of the  change and her emergent transfer to [facility], ER.  They are and route to [facility], ER.  Spoke with Dr. Rush Landmark who is the accepting ER physician.  [facility] has been called.  On reevaluation patient was hypertensive, dose of hydralazine given with improvement.  Patient is now laying in bed, slowly looking around at times holding her head, continues to have a mental status decline.  Plan for emergent transfer to [facility], ER.  [facility] has arrived.  On reevaluation patient continues to protect her airway, oxygen saturation is 97% on room air, respirations ranging from 17-25.  Eyes are still crossing the midline, pupils are still equal and reactive, patient at times still follows commands but is not conversing.  Had a conversation with [facility] about concern for airway decompensation.  Patient has been stable from that standpoint here in the ER, her neurologic exam is very important and also intubation does not come without risks in regards to intracranial hypertension/herniation.  I do not feel she is an emergent airway risk for the short transfer to [facility].  Patient stable at time of transfer.        Final Clinical Impression(s) / ED Diagnoses Final diagnoses:  None    Rx / DC Orders ED Discharge Orders     None         Rozelle Logan, DO 10/22/23 1932

## 2023-10-22 NOTE — ED Triage Notes (Addendum)
Pt to ED via EMS form [facility] after a witnessed fall without loss of conciousness. Pt reporting back pain after fall with hx of the same. Lidocaine patch in place upon arrival for chronic back pain. Altered mental status at baseline. Alert to self only.   Bruising around right eye reportedly from a fall on Monday. No blood thinner use.   EMS vitals: 156/68, HR 60, 96% on RA. CBG 124.

## 2023-10-22 NOTE — ED Notes (Signed)
RN requested sitter d/t patient becoming restless and attempting to crawl out of bed.

## 2023-10-22 NOTE — ED Provider Notes (Signed)
[place] EMERGENCY DEPARTMENT AT [facility] Provider Note   CSN: [id number] Arrival date & time: 10/22/23  1404     History  Chief Complaint  Patient presents with   Marletta Lor    Emily Escobar is a 87 y.o. female who presents as a TOSH for head bleed. Patient had a mechanical fall today without LOC at her [facility] [employer] [facility]). At baseline is oriented only to self. Noted to be off of baseline with repetitive speech and not making conversation. Had been falling numerous times recently. Larey Seat at OSH ED also near radiology before [facility] was done. Not on blood thinners. CTHWO contrast at [facility] showed acute on chronic SDH that was 9 mm at largest an with 6 mm R to L midline shift. OSHED discussed with Dr. Jake Samples from NSGY. Keppra was given and BP control goals <160 systolic initiated. Mentation continued to decline but airway maintained. Emergently transferred to our ED.   The history is provided by the patient, medical records and the EMS personnel.       Home Medications Prior to Admission medications   Medication Sig Start Date End Date Taking? Authorizing Provider  losartan (COZAAR) 25 MG tablet Take 25 mg by mouth daily. 10/08/23  Yes [provider]  acetaminophen (TYLENOL) 500 MG tablet Take 1,000 mg by mouth in the morning, at noon, and at bedtime.    [provider]  atenolol (TENORMIN) 50 MG tablet Take 50 mg by mouth daily. 07/02/16   [provider]  atorvastatin (LIPITOR) 20 MG tablet Take 20 mg by mouth daily. 08/16/20   [provider]  cholecalciferol (VITAMIN D3) 25 MCG (1000 UNIT) tablet Take 1,000 Units by mouth daily.    [provider]  citalopram (CELEXA) 10 MG tablet Take 10 mg by mouth daily. 09/15/22   [provider]  divalproex (DEPAKOTE SPRINKLE) 125 MG capsule Take 125 mg by mouth 3 (three) times daily. 09/28/23   [provider]  fluticasone (FLONASE) 50 MCG/ACT nasal spray Place 2 sprays into  both nostrils daily.    [provider]  guaiFENesin-dextromethorphan (ROBITUSSIN DM) 100-10 MG/5ML syrup Take 10 mLs by mouth every 4 (four) hours as needed for cough (chest congestion). 06/02/23   Juliet Rude, PA-C  lidocaine (LIDODERM) 5 % Place 1 patch onto the skin daily. Remove & Discard patch within 12 hours or as directed by MD 06/02/23   Juliet Rude, PA-C  methocarbamol (ROBAXIN) 500 MG tablet Take 1 tablet (500 mg total) by mouth every 8 (eight) hours as needed for muscle spasms (pain from rib fractues). 06/02/23   Juliet Rude, PA-C  mirtazapine (REMERON) 15 MG tablet Take 15 mg by mouth at bedtime. 05/29/16   [provider]  Multiple Vitamin (MULTIVITAMIN WITH MINERALS) TABS tablet Take 1 tablet by mouth daily.    [provider]  Multiple Vitamins-Minerals (PRESERVISION AREDS 2 PO) Take 1 tablet by mouth in the morning and at bedtime.    [provider]  omeprazole (PRILOSEC) 40 MG capsule Take 40 mg by mouth daily.    [provider]      Allergies    Codeine, Fentanyl, Morphine, and Morphine and codeine    Review of Systems   Review of Systems unable to obtain  Physical Exam Updated Vital Signs BP 137/73 (BP Location: Right Arm)   Pulse 69   Temp 99 F (37.2 C) (Oral)   Resp 18   Ht 5\' 3"  (1.6 m)  Wt 78 kg   SpO2 96%   BMI 30.46 kg/m  Physical Exam Constitutional:      Appearance: She is ill-appearing.  HENT:     Head: Normocephalic and atraumatic.     Comments: Old bruising around R eye    Nose: Nose normal.     Mouth/Throat:     Mouth: Mucous membranes are moist.  Eyes:     Extraocular Movements: Extraocular movements intact.     Pupils: Pupils are equal, round, and reactive to light.  Cardiovascular:     Rate and Rhythm: Normal rate and regular rhythm.     Pulses: Normal pulses.     Heart sounds: Normal heart sounds.  Pulmonary:     Effort: Pulmonary effort is normal.     Breath sounds: Normal  breath sounds.  Abdominal:     General: There is no distension.     Palpations: Abdomen is soft.     Tenderness: There is no abdominal tenderness. There is no guarding.  Musculoskeletal:        General: No swelling, deformity or signs of injury.     Cervical back: Neck supple.     Right lower leg: No edema.     Left lower leg: No edema.  Skin:    General: Skin is warm and dry.  Neurological:     Mental Status: She is alert.     GCS: GCS eye subscore is 3. GCS verbal subscore is 2. GCS motor subscore is 5.     Comments: Mumbling incomprehensibly. Eyes only open to voice occasionally. Occasionally follows commands. Pupils reactive and equal and cross midline.      ED Results / Procedures / Treatments   Labs (all labs ordered are listed, but only abnormal results are displayed) Labs Reviewed  BASIC METABOLIC PANEL - Abnormal; Notable for the following components:      Result Value   Glucose, Bld 105 (*)    All other components within normal limits  MRSA NEXT GEN BY PCR, NASAL  CBC WITH DIFFERENTIAL/PLATELET  PROTIME-INR  URINALYSIS, ROUTINE W REFLEX MICROSCOPIC    EKG EKG Interpretation Date/Time:  Thursday October 22 2023 18:25:11 EST Ventricular Rate:  64 PR Interval:  167 QRS Duration:  86 QT Interval:  588 QTC Calculation: 607 R Axis:   -20  Text Interpretation: Sinus rhythm Borderline left axis deviation Nonspecific T abnrm, anterolateral leads Prolonged QT interval Confirmed by Coralee Pesa [phone]) on 10/22/2023 6:31:41 PM  Radiology DG Thoracic Spine 2 View  Result Date: 10/22/2023 CLINICAL DATA:  Larey Seat EXAM: THORACIC SPINE 2 VIEWS COMPARISON:  07/06/2023 FINDINGS: Frontal and lateral views of the thoracic spine are obtained. Alignment is grossly anatomic. Chronic compression deformities of the superior endplates at T12 and L1 are again noted. No acute fractures. Mild diffuse spondylosis. Paraspinal soft tissues are unremarkable. IMPRESSION: 1. No acute fracture.  2. Stable chronic compression deformities at T12 and L1. Electronically Signed   By: Sharlet Salina M.D.   On: 10/22/2023 22:37   DG Lumbar Spine Complete  Result Date: 10/22/2023 CLINICAL DATA:  Larey Seat EXAM: LUMBAR SPINE - COMPLETE 4+ VIEW COMPARISON:  09/04/2022, 07/06/2023 FINDINGS: Frontal, bilateral oblique, lateral views of the lumbar spine are obtained. Five non-rib-bearing lumbar type vertebral bodies are identified, with left convex scoliosis centered at L4. Chronic compression deformity at L3 with resulting vertebra plana. Stable mild superior endplate compression deformities at T12 and L1. Stable spondylosis at the L4-5 level. Mild facet hypertrophy at the lumbosacral junction  is unchanged. Sacroiliac joints are normal. IMPRESSION: 1. No acute fracture. 2. Chronic compression deformities at T12, L1, and L3 as above. 3. Stable multilevel degenerative changes. Electronically Signed   By: Sharlet Salina M.D.   On: 10/22/2023 22:36   DG Chest Port 1 View  Result Date: 10/22/2023 CLINICAL DATA:  Status post fall. EXAM: PORTABLE CHEST 1 VIEW COMPARISON:  10/07/2023 FINDINGS: Stable lung volumes. Unchanged heart size and mediastinal contours. Slight peripheral opacity at the right lung base favor scarring with overlying nonacute rib fractures. No pneumothorax or significant pleural effusion. Improved patchy bibasilar opacities. Mild chronic interstitial coarsening. IMPRESSION: 1. Improved patchy bibasilar opacities. 2. Slight peripheral opacity at the right lung base favors scarring. Electronically Signed   By: Narda Rutherford M.D.   On: 10/22/2023 20:32   DG Pelvis Portable  Result Date: 10/22/2023 CLINICAL DATA:  Trauma, fall. EXAM: PORTABLE PELVIS 1-2 VIEWS COMPARISON:  None Available. FINDINGS: Right iliac crest excluded from the field of view. There is no evidence of acute fracture. Left hip arthroplasty is in expected alignment. No periprosthetic lucency fracture. No pubic symphyseal or  sacroiliac diastasis. IMPRESSION: No acute fracture of the pelvis. Left hip arthroplasty without complication. Electronically Signed   By: Narda Rutherford M.D.   On: 10/22/2023 20:31   CT Head Wo Contrast  Result Date: 10/22/2023 CLINICAL DATA:  Fall with loss of consciousness EXAM: CT HEAD WITHOUT CONTRAST CT MAXILLOFACIAL WITHOUT CONTRAST CT CERVICAL SPINE WITHOUT CONTRAST TECHNIQUE: Multidetector CT imaging of the head, cervical spine, and maxillofacial structures were performed using the standard protocol without intravenous contrast. Multiplanar CT image reconstructions of the cervical spine and maxillofacial structures were also generated. RADIATION DOSE REDUCTION: This exam was performed according to the departmental dose-optimization program which includes automated exposure control, adjustment of the mA and/or kV according to patient size and/or use of iterative reconstruction technique. COMPARISON:  10/11/2023 CT head, 09/08/2023 CT cervical spine, 08/02/2023 CT maxillofacial FINDINGS: CT HEAD FINDINGS Brain: Mixed density subdural collection overlying the right cerebral convexity, which has increased in size compared to 10/11/2023 and contains a prominent hyperdense component. The collection now measures up to 14 mm, previously up to 9 mm when remeasured similarly. There is mass effect on the underlying right cerebral hemisphere, with narrowing of the right lateral ventricle and 6 mm of right-to-left midline shift, new from the prior exam. No evidence of acute infarct, mass, or hydrocephalus. Partial empty sella. Normal craniocervical junction. Vascular: No hyperdense vessel. Atherosclerotic calcifications in the intracranial carotid and vertebral arteries. Skull: Negative for fracture or focal lesion. CT MAXILLOFACIAL FINDINGS Osseous: No fracture or mandibular dislocation. No destructive process. Orbits: No traumatic or inflammatory finding. Status post bilateral lens replacements. Sinuses: Mild  mucosal thickening. Sequela of prior endoscopic sinus surgery. Trace fluid in the left mastoid air cells. Soft tissues: Negative. CT CERVICAL SPINE FINDINGS Alignment: No traumatic listhesis. Straightening of the normal cervical lordosis with trace anterolisthesis C5 on C6, unchanged. Mild levocurvature, which may be positional. Skull base and vertebrae: No acute fracture. No primary bone lesion or focal pathologic process. Soft tissues and spinal canal: No prevertebral fluid or swelling. No visible canal hematoma. Disc levels: Degenerative changes in the cervical spine. No high-grade spinal canal stenosis. Upper chest: No focal pulmonary opacity or pleural effusion. IMPRESSION: 1. 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 6 mm of right-to-left midline shift, new from the prior exam.  2. No acute facial bone fracture. 3. No acute fracture or traumatic listhesis in the cervical spine. These results were called by telephone at the time of interpretation on 10/22/2023 at 6:23 pm to provider [facility] , who verbally acknowledged these results. Electronically Signed   By: Wiliam Ke M.D.   On: 10/22/2023 18:24   CT Cervical Spine Wo Contrast  Result Date: 10/22/2023 CLINICAL DATA:  Fall with loss of consciousness EXAM: CT HEAD WITHOUT CONTRAST CT MAXILLOFACIAL WITHOUT CONTRAST CT CERVICAL SPINE WITHOUT CONTRAST TECHNIQUE: Multidetector CT imaging of the head, cervical spine, and maxillofacial structures were performed using the standard protocol without intravenous contrast. Multiplanar CT image reconstructions of the cervical spine and maxillofacial structures were also generated. RADIATION DOSE REDUCTION: This exam was performed according to the departmental dose-optimization program which includes automated exposure control, adjustment of the mA and/or kV according to patient size  and/or use of iterative reconstruction technique. COMPARISON:  10/11/2023 CT head, 09/08/2023 CT cervical spine, 08/02/2023 CT maxillofacial FINDINGS: CT HEAD FINDINGS Brain: Mixed density subdural collection overlying the right cerebral convexity, which has increased in size compared to 10/11/2023 and contains a prominent hyperdense component. The collection now measures up to 14 mm, previously up to 9 mm when remeasured similarly. There is mass effect on the underlying right cerebral hemisphere, with narrowing of the right lateral ventricle and 6 mm of right-to-left midline shift, new from the prior exam. No evidence of acute infarct, mass, or hydrocephalus. Partial empty sella. Normal craniocervical junction. Vascular: No hyperdense vessel. Atherosclerotic calcifications in the intracranial carotid and vertebral arteries. Skull: Negative for fracture or focal lesion. CT MAXILLOFACIAL FINDINGS Osseous: No fracture or mandibular dislocation. No destructive process. Orbits: No traumatic or inflammatory finding. Status post bilateral lens replacements. Sinuses: Mild mucosal thickening. Sequela of prior endoscopic sinus surgery. Trace fluid in the left mastoid air cells. Soft tissues: Negative. CT CERVICAL SPINE FINDINGS Alignment: No traumatic listhesis. Straightening of the normal cervical lordosis with trace anterolisthesis C5 on C6, unchanged. Mild levocurvature, which may be positional. Skull base and vertebrae: No acute fracture. No primary bone lesion or focal pathologic process. Soft tissues and spinal canal: No prevertebral fluid or swelling. No visible canal hematoma. Disc levels: Degenerative changes in the cervical spine. No high-grade spinal canal stenosis. Upper chest: No focal pulmonary opacity or pleural effusion. IMPRESSION: 1. 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 6 mm of  right-to-left midline shift, new from the prior exam. 2. No acute facial bone fracture. 3. No acute fracture or traumatic listhesis in the cervical spine. These results were called by telephone at the time of interpretation on 10/22/2023 at 6:23 pm to provider [facility] , who verbally acknowledged these results. Electronically Signed   By: Wiliam Ke M.D.   On: 10/22/2023 18:24   CT Maxillofacial WO CM  Result Date: 10/22/2023 CLINICAL DATA:  Fall with loss of consciousness EXAM: CT HEAD WITHOUT CONTRAST CT MAXILLOFACIAL WITHOUT CONTRAST CT CERVICAL SPINE WITHOUT CONTRAST TECHNIQUE: Multidetector CT imaging of the head, cervical spine, and maxillofacial structures were performed using the standard protocol without intravenous contrast. Multiplanar CT image reconstructions of the cervical spine and maxillofacial structures were also generated. RADIATION DOSE REDUCTION: This exam was performed according to the departmental dose-optimization program which includes automated exposure control, adjustment of the mA and/or kV according to patient size and/or use of iterative reconstruction technique. COMPARISON:  10/11/2023  CT head, 09/08/2023 CT cervical spine, 08/02/2023 CT maxillofacial FINDINGS: CT HEAD FINDINGS Brain: Mixed density subdural collection overlying the right cerebral convexity, which has increased in size compared to 10/11/2023 and contains a prominent hyperdense component. The collection now measures up to 14 mm, previously up to 9 mm when remeasured similarly. There is mass effect on the underlying right cerebral hemisphere, with narrowing of the right lateral ventricle and 6 mm of right-to-left midline shift, new from the prior exam. No evidence of acute infarct, mass, or hydrocephalus. Partial empty sella. Normal craniocervical junction. Vascular: No hyperdense vessel. Atherosclerotic calcifications in the intracranial carotid and vertebral arteries. Skull: Negative for fracture or focal  lesion. CT MAXILLOFACIAL FINDINGS Osseous: No fracture or mandibular dislocation. No destructive process. Orbits: No traumatic or inflammatory finding. Status post bilateral lens replacements. Sinuses: Mild mucosal thickening. Sequela of prior endoscopic sinus surgery. Trace fluid in the left mastoid air cells. Soft tissues: Negative. CT CERVICAL SPINE FINDINGS Alignment: No traumatic listhesis. Straightening of the normal cervical lordosis with trace anterolisthesis C5 on C6, unchanged. Mild levocurvature, which may be positional. Skull base and vertebrae: No acute fracture. No primary bone lesion or focal pathologic process. Soft tissues and spinal canal: No prevertebral fluid or swelling. No visible canal hematoma. Disc levels: Degenerative changes in the cervical spine. No high-grade spinal canal stenosis. Upper chest: No focal pulmonary opacity or pleural effusion. IMPRESSION: 1. 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 6 mm of right-to-left midline shift, new from the prior exam. 2. No acute facial bone fracture. 3. No acute fracture or traumatic listhesis in the cervical spine. These results were called by telephone at the time of interpretation on 10/22/2023 at 6:23 pm to provider [facility] , who verbally acknowledged these results. Electronically Signed   By: Wiliam Ke M.D.   On: 10/22/2023 18:24    Procedures Procedures    Medications Ordered in ED Medications  hydrALAZINE (APRESOLINE) injection 5 mg (5 mg Intravenous Not Given 10/22/23 1928)  acetaminophen (TYLENOL) tablet 1,000 mg (has no administration in time range)  atenolol (TENORMIN) tablet 50 mg (50 mg Oral Not Given 10/22/23 2151)  atorvastatin (LIPITOR) tablet 20 mg (20 mg Oral Not Given 10/22/23 2152)  losartan (COZAAR) tablet 25 mg (25 mg Oral Not Given 10/22/23 2152)  citalopram (CELEXA) tablet 10 mg (10  mg Oral Not Given 10/22/23 2153)  mirtazapine (REMERON) tablet 15 mg (15 mg Oral Not Given 10/22/23 2152)  pantoprazole (PROTONIX) EC tablet 80 mg (80 mg Oral Not Given 10/22/23 2152)  divalproex (DEPAKOTE SPRINKLE) capsule 125 mg (125 mg Oral Not Given 10/22/23 2152)  dexamethasone (DECADRON) injection 4 mg (4 mg Intravenous Given 10/22/23 2205)  0.9 %  sodium chloride infusion ( Intravenous New Bag/Given 10/22/23 2218)  methocarbamol (ROBAXIN) injection 500 mg (has no administration in time range)  levETIRAcetam (KEPPRA) IVPB 500 mg/100 mL premix (500 mg Intravenous New Bag/Given 10/22/23 2221)  levETIRAcetam (KEPPRA) IVPB 1000 mg/100 mL premix (0 mg Intravenous Stopped 10/22/23 1924)  hydrALAZINE (APRESOLINE) injection 5 mg (5 mg Intravenous Given 10/22/23 1851)    ED Course/ Medical Decision Making/ A&P                                 Medical Decision Making Amount and/or Complexity of Data Reviewed Labs: ordered. Radiology: ordered.  Risk Prescription  drug management. Decision regarding hospitalization.   87 year old female who presents with an acute on chronic subdural now with midline shift from outside hospital.  I reviewed her imaging from outside hospital which includes a CT head without contrast and a CT cervical spine as well as CT face and x-ray of the chest and pelvis.  On arrival she is hemodynamically stable with an intact airway.  GCS is 10-11.  Intermittently following commands.  Speech is not comprehensible.  She does not take blood thinners.  After initial assessment and chart review neurosurgery was consulted and admitted the patient to the stepdown unit for further and definitive care.         Final Clinical Impression(s) / ED Diagnoses Final diagnoses:  Subdural hematoma [facility])    Rx / DC Orders ED Discharge Orders     None         Karmen Stabs, MD 10/22/23 2343    Tegeler, Canary Brim, MD 10/23/23 [phone]

## 2023-10-22 NOTE — H&P (Signed)
Providing Compassionate, Quality Care - Together  NEUROSURGERY HISTORY & PHYSICAL   Emily Escobar is an 87 y.o. female.   Chief Complaint: Fall, confusion HPI: This is a 87 year old female, lives in assisted living at [facility], had a witnessed fall without loss of consciousness, with complaints of back pain.  History of progressive cognitive decline over the past 6 months to a year per the son.  States that she has difficulty putting words together and finishing sentences at her baseline, typically worse in the evenings.  Patient is a poor historian, therefore history is per the chart and over the phone from the son.  Past Medical History:  Diagnosis Date   Anxiety and depression    Closed left femoral fracture (HCC)    History of vertebral fracture    from fall in September 2016   Hypertension    Insomnia    Macular degeneration    Memory loss    Parkinsonism (HCC)    Solitary kidney     Past Surgical History:  Procedure Laterality Date   ABDOMINAL HYSTERECTOMY     BLADDER SUSPENSION     BREAST SURGERY     CATARACT EXTRACTION, BILATERAL     EYE SURGERY     GALLBLADDER SURGERY     JOINT REPLACEMENT     NASAL SEPTUM SURGERY     RECONSTRUCTION OF EYELID     RECTOCELE REPAIR     TOTAL HIP ARTHROPLASTY Left 07/17/2016   Procedure: TOTAL HIP ARTHROPLASTY ANTERIOR APPROACH;  Surgeon: Tarry Kos, MD;  Location: [facility] OR;  Service: Orthopedics;  Laterality: Left;    Family History  Problem Relation Age of Onset   Heart disease Mother    Heart attack Father    Heart disease Brother    Heart disease Brother    Social History:  reports that she has never smoked. She has never used smokeless tobacco. She reports that she does not currently use alcohol. She reports that she does not use drugs.  Allergies:  Allergies  Allergen Reactions   Codeine Other (See Comments)    Pt states she has tolerated norco in the past Allergy not listed on MAR    Fentanyl Other (See Comments)     Hallucinations   Morphine Other (See Comments)    Patient's son states pt feels "looping" with morphine Allergy not listed on MAR    Morphine And Codeine Other (See Comments)    Patient's son states pt feels "looping" with morphine Allergy not listed on MAR     (Not in a hospital admission)   Results for orders placed or performed during the hospital encounter of 10/22/23 (from the past 48 hour(s))  CBC with Differential     Status: None   Collection Time: 10/22/23  6:19 PM  Result Value Ref Range   WBC 8.6 4.0 - 10.5 K/uL   RBC 4.64 3.87 - 5.11 MIL/uL   Hemoglobin 13.3 12.0 - 15.0 g/dL   HCT 16.1 09.6 - 04.5 %   MCV 89.0 80.0 - 100.0 fL   MCH 28.7 26.0 - 34.0 pg   MCHC 32.2 30.0 - 36.0 g/dL   RDW 40.9 81.1 - 91.4 %   Platelets 220 150 - 400 K/uL   nRBC 0.0 0.0 - 0.2 %   Neutrophils Relative % 84 %   Neutro Abs 7.2 1.7 - 7.7 K/uL   Lymphocytes Relative 8 %   Lymphs Abs 0.7 0.7 - 4.0 K/uL   Monocytes Relative  7 %   Monocytes Absolute 0.6 0.1 - 1.0 K/uL   Eosinophils Relative 1 %   Eosinophils Absolute 0.1 0.0 - 0.5 K/uL   Basophils Relative 0 %   Basophils Absolute 0.0 0.0 - 0.1 K/uL   Immature Granulocytes 0 %   Abs Immature Granulocytes 0.03 0.00 - 0.07 K/uL    Comment: Performed at [facility], [address] [place], [place] [address]  Basic metabolic panel     Status: Abnormal   Collection Time: 10/22/23  6:19 PM  Result Value Ref Range   Sodium 140 135 - 145 mmol/L   Potassium 3.8 3.5 - 5.1 mmol/L   Chloride 104 98 - 111 mmol/L   CO2 30 22 - 32 mmol/L   Glucose, Bld 105 (H) 70 - 99 mg/dL    Comment: Glucose reference range applies only to samples taken after fasting for at least 8 hours.   BUN 16 8 - 23 mg/dL   Creatinine, Ser 9.14 0.44 - 1.00 mg/dL   Calcium 9.3 8.9 - 78.2 mg/dL   GFR, Estimated >95 >62 mL/min    Comment: (NOTE) Calculated using the CKD-EPI Creatinine Equation (2021)    Anion gap 6 5 - 15    Comment: Performed at  [facility], [address] [place], [place] [address]  Protime-INR     Status: None   Collection Time: 10/22/23  6:19 PM  Result Value Ref Range   Prothrombin Time 13.7 11.4 - 15.2 seconds   INR 1.0 0.8 - 1.2    Comment: (NOTE) INR goal varies based on device and disease states. Performed at [facility], [address] [place], [place] [address]    DG Chest Esko 1 View  Result Date: 10/22/2023 CLINICAL DATA:  Status post fall. EXAM: PORTABLE CHEST 1 VIEW COMPARISON:  10/07/2023 FINDINGS: Stable lung volumes. Unchanged heart size and mediastinal contours. Slight peripheral opacity at the right lung base favor scarring with overlying nonacute rib fractures. No pneumothorax or significant pleural effusion. Improved patchy bibasilar opacities. Mild chronic interstitial coarsening. IMPRESSION: 1. Improved patchy bibasilar opacities. 2. Slight peripheral opacity at the right lung base favors scarring. Electronically Signed   By: Narda Rutherford M.D.   On: 10/22/2023 20:32   DG Pelvis Portable  Result Date: 10/22/2023 CLINICAL DATA:  Trauma, fall. EXAM: PORTABLE PELVIS 1-2 VIEWS COMPARISON:  None Available. FINDINGS: Right iliac crest excluded from the field of view. There is no evidence of acute fracture. Left hip arthroplasty is in expected alignment. No periprosthetic lucency fracture. No pubic symphyseal or sacroiliac diastasis. IMPRESSION: No acute fracture of the pelvis. Left hip arthroplasty without complication. Electronically Signed   By: Narda Rutherford M.D.   On: 10/22/2023 20:31   CT Head Wo Contrast  Result Date: 10/22/2023 CLINICAL DATA:  Fall with loss of consciousness EXAM: CT HEAD WITHOUT CONTRAST CT MAXILLOFACIAL WITHOUT CONTRAST CT CERVICAL SPINE WITHOUT CONTRAST TECHNIQUE: Multidetector CT imaging of the head, cervical spine, and maxillofacial structures were performed using the standard protocol without intravenous contrast.  Multiplanar CT image reconstructions of the cervical spine and maxillofacial structures were also generated. RADIATION DOSE REDUCTION: This exam was performed according to the departmental dose-optimization program which includes automated exposure control, adjustment of the mA and/or kV according to patient size and/or use of iterative reconstruction technique. COMPARISON:  10/11/2023 CT head, 09/08/2023 CT cervical spine, 08/02/2023 CT maxillofacial FINDINGS: CT HEAD FINDINGS Brain: Mixed density subdural collection overlying the right cerebral convexity, which has increased  in size compared to 10/11/2023 and contains a prominent hyperdense component. The collection now measures up to 14 mm, previously up to 9 mm when remeasured similarly. There is mass effect on the underlying right cerebral hemisphere, with narrowing of the right lateral ventricle and 6 mm of right-to-left midline shift, new from the prior exam. No evidence of acute infarct, mass, or hydrocephalus. Partial empty sella. Normal craniocervical junction. Vascular: No hyperdense vessel. Atherosclerotic calcifications in the intracranial carotid and vertebral arteries. Skull: Negative for fracture or focal lesion. CT MAXILLOFACIAL FINDINGS Osseous: No fracture or mandibular dislocation. No destructive process. Orbits: No traumatic or inflammatory finding. Status post bilateral lens replacements. Sinuses: Mild mucosal thickening. Sequela of prior endoscopic sinus surgery. Trace fluid in the left mastoid air cells. Soft tissues: Negative. CT CERVICAL SPINE FINDINGS Alignment: No traumatic listhesis. Straightening of the normal cervical lordosis with trace anterolisthesis C5 on C6, unchanged. Mild levocurvature, which may be positional. Skull base and vertebrae: No acute fracture. No primary bone lesion or focal pathologic process. Soft tissues and spinal canal: No prevertebral fluid or swelling. No visible canal hematoma. Disc levels: Degenerative  changes in the cervical spine. No high-grade spinal canal stenosis. Upper chest: No focal pulmonary opacity or pleural effusion. IMPRESSION: 1. 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 6 mm of right-to-left midline shift, new from the prior exam. 2. No acute facial bone fracture. 3. No acute fracture or traumatic listhesis in the cervical spine. These results were called by telephone at the time of interpretation on 10/22/2023 at 6:23 pm to provider [facility] , who verbally acknowledged these results. Electronically Signed   By: Wiliam Ke M.D.   On: 10/22/2023 18:24   CT Cervical Spine Wo Contrast  Result Date: 10/22/2023 CLINICAL DATA:  Fall with loss of consciousness EXAM: CT HEAD WITHOUT CONTRAST CT MAXILLOFACIAL WITHOUT CONTRAST CT CERVICAL SPINE WITHOUT CONTRAST TECHNIQUE: Multidetector CT imaging of the head, cervical spine, and maxillofacial structures were performed using the standard protocol without intravenous contrast. Multiplanar CT image reconstructions of the cervical spine and maxillofacial structures were also generated. RADIATION DOSE REDUCTION: This exam was performed according to the departmental dose-optimization program which includes automated exposure control, adjustment of the mA and/or kV according to patient size and/or use of iterative reconstruction technique. COMPARISON:  10/11/2023 CT head, 09/08/2023 CT cervical spine, 08/02/2023 CT maxillofacial FINDINGS: CT HEAD FINDINGS Brain: Mixed density subdural collection overlying the right cerebral convexity, which has increased in size compared to 10/11/2023 and contains a prominent hyperdense component. The collection now measures up to 14 mm, previously up to 9 mm when remeasured similarly. There is mass effect on the underlying right cerebral hemisphere, with narrowing of the right lateral ventricle and  6 mm of right-to-left midline shift, new from the prior exam. No evidence of acute infarct, mass, or hydrocephalus. Partial empty sella. Normal craniocervical junction. Vascular: No hyperdense vessel. Atherosclerotic calcifications in the intracranial carotid and vertebral arteries. Skull: Negative for fracture or focal lesion. CT MAXILLOFACIAL FINDINGS Osseous: No fracture or mandibular dislocation. No destructive process. Orbits: No traumatic or inflammatory finding. Status post bilateral lens replacements. Sinuses: Mild mucosal thickening. Sequela of prior endoscopic sinus surgery. Trace fluid in the left mastoid air cells. Soft tissues: Negative. CT CERVICAL SPINE FINDINGS Alignment: No traumatic listhesis. Straightening of the normal cervical lordosis with trace anterolisthesis C5 on C6, unchanged. Mild levocurvature, which may be positional.  Skull base and vertebrae: No acute fracture. No primary bone lesion or focal pathologic process. Soft tissues and spinal canal: No prevertebral fluid or swelling. No visible canal hematoma. Disc levels: Degenerative changes in the cervical spine. No high-grade spinal canal stenosis. Upper chest: No focal pulmonary opacity or pleural effusion. IMPRESSION: 1. 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 6 mm of right-to-left midline shift, new from the prior exam. 2. No acute facial bone fracture. 3. No acute fracture or traumatic listhesis in the cervical spine. These results were called by telephone at the time of interpretation on 10/22/2023 at 6:23 pm to provider [facility] , who verbally acknowledged these results. Electronically Signed   By: Wiliam Ke M.D.   On: 10/22/2023 18:24   CT Maxillofacial WO CM  Result Date: 10/22/2023 CLINICAL DATA:  Fall with loss of consciousness EXAM: CT HEAD WITHOUT CONTRAST CT MAXILLOFACIAL WITHOUT CONTRAST  CT CERVICAL SPINE WITHOUT CONTRAST TECHNIQUE: Multidetector CT imaging of the head, cervical spine, and maxillofacial structures were performed using the standard protocol without intravenous contrast. Multiplanar CT image reconstructions of the cervical spine and maxillofacial structures were also generated. RADIATION DOSE REDUCTION: This exam was performed according to the departmental dose-optimization program which includes automated exposure control, adjustment of the mA and/or kV according to patient size and/or use of iterative reconstruction technique. COMPARISON:  10/11/2023 CT head, 09/08/2023 CT cervical spine, 08/02/2023 CT maxillofacial FINDINGS: CT HEAD FINDINGS Brain: Mixed density subdural collection overlying the right cerebral convexity, which has increased in size compared to 10/11/2023 and contains a prominent hyperdense component. The collection now measures up to 14 mm, previously up to 9 mm when remeasured similarly. There is mass effect on the underlying right cerebral hemisphere, with narrowing of the right lateral ventricle and 6 mm of right-to-left midline shift, new from the prior exam. No evidence of acute infarct, mass, or hydrocephalus. Partial empty sella. Normal craniocervical junction. Vascular: No hyperdense vessel. Atherosclerotic calcifications in the intracranial carotid and vertebral arteries. Skull: Negative for fracture or focal lesion. CT MAXILLOFACIAL FINDINGS Osseous: No fracture or mandibular dislocation. No destructive process. Orbits: No traumatic or inflammatory finding. Status post bilateral lens replacements. Sinuses: Mild mucosal thickening. Sequela of prior endoscopic sinus surgery. Trace fluid in the left mastoid air cells. Soft tissues: Negative. CT CERVICAL SPINE FINDINGS Alignment: No traumatic listhesis. Straightening of the normal cervical lordosis with trace anterolisthesis C5 on C6, unchanged. Mild levocurvature, which may be positional. Skull base and  vertebrae: No acute fracture. No primary bone lesion or focal pathologic process. Soft tissues and spinal canal: No prevertebral fluid or swelling. No visible canal hematoma. Disc levels: Degenerative changes in the cervical spine. No high-grade spinal canal stenosis. Upper chest: No focal pulmonary opacity or pleural effusion. IMPRESSION: 1. 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 6 mm of right-to-left midline shift, new from the prior exam. 2. No acute facial bone fracture. 3. No acute fracture or traumatic listhesis in the cervical spine. These results were called by telephone at the time of interpretation on 10/22/2023 at 6:23 pm to provider [facility] , who verbally acknowledged these results. Electronically Signed   By: Wiliam Ke M.D.   On: 10/22/2023 18:24    ROS  Unable to obtain Blood pressure 132/69, pulse 70, temperature 98 F (36.7  C), temperature source Oral, resp. rate (!) 32, height 5\' 3"  (1.6 m), weight 78 kg, SpO2 98%. 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  We had a long conversation about ultimately goals of care given her progressive neurologic decline over the past 6 months to 1 year.  I do not recommend urgent surgical intervention in the form of a craniotomy and evacuation at this time as she is awake and alert.  We discussed Decadron and possible MMA embolization. -Admit to progressive unit -Keppra 500 twice daily -Pain control  Thank you for allowing me to participate in this patient's care.   Please do not hesitate to call with questions or concerns.   Monia Pouch, DO Neurosurgeon [facility] [phone]

## 2023-10-22 NOTE — ED Notes (Signed)
RN notified provider about patients change in status and becoming more altered.

## 2023-10-22 NOTE — ED Notes (Signed)
Per patient's family, patient was  able to have conversation this morning after fall and requested to be seen in ED.

## 2023-10-23 ENCOUNTER — Inpatient Hospital Stay (HOSPITAL_COMMUNITY): Payer: Medicare HMO

## 2023-10-23 LAB — URINALYSIS, ROUTINE W REFLEX MICROSCOPIC
Bilirubin Urine: NEGATIVE
Glucose, UA: NEGATIVE mg/dL
Hgb urine dipstick: NEGATIVE
Ketones, ur: 5 mg/dL — AB
Leukocytes,Ua: NEGATIVE
Nitrite: NEGATIVE
Protein, ur: NEGATIVE mg/dL
Specific Gravity, Urine: 1.017 (ref 1.005–1.030)
pH: 6 (ref 5.0–8.0)

## 2023-10-23 LAB — MRSA NEXT GEN BY PCR, NASAL: MRSA by PCR Next Gen: NOT DETECTED

## 2023-10-23 MED ORDER — FOOD THICKENER (SIMPLYTHICK)
10.0000 | Freq: Once | ORAL | Status: DC
  Filled 2023-10-23: qty 10

## 2023-10-23 MED ORDER — FOOD THICKENER (SIMPLYTHICK): 10.0000 | ORAL | Status: DC | PRN

## 2023-10-23 NOTE — Evaluation (Signed)
Clinical/Bedside Swallow Evaluation Patient Details  Name: Emily Escobar MRN: [id number] Date of Birth: 12-07-1936  Today's Date: 10/23/2023 Time: SLP Start Time (ACUTE ONLY): 1118 SLP Stop Time (ACUTE ONLY): 1150 SLP Time Calculation (min) (ACUTE ONLY): 32 min  Past Medical History:  Past Medical History:  Diagnosis Date   Anxiety and depression    Closed left femoral fracture (HCC)    History of vertebral fracture    from fall in September 2016   Hypertension    Insomnia    Macular degeneration    Memory loss    Parkinsonism (HCC)    Solitary kidney    Past Surgical History:  Past Surgical History:  Procedure Laterality Date   ABDOMINAL HYSTERECTOMY     BLADDER SUSPENSION     BREAST SURGERY     CATARACT EXTRACTION, BILATERAL     EYE SURGERY     GALLBLADDER SURGERY     JOINT REPLACEMENT     NASAL SEPTUM SURGERY     RECONSTRUCTION OF EYELID     RECTOCELE REPAIR     TOTAL HIP ARTHROPLASTY Left 07/17/2016   Procedure: TOTAL HIP ARTHROPLASTY ANTERIOR APPROACH;  Surgeon: Tarry Kos, MD;  Location: [facility] OR;  Service: Orthopedics;  Laterality: Left;   HPI:  87 year old female presented to the emergency department from [facility] nursing facility after sustaining fall. 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    Assessment / Plan / Recommendation  Clinical Impression  Emily Escobar presents with concerns for a potentially transient dysphagia with consistent coughing noted after drinking thin liquids.  Her dtr-in-law, Emily Escobar, was at the bedside. Pt was alert, communicative; had difficulty following commands. Speech is limited to short phrases, often repeating the words of her communication partner.  She accepted pudding, bites of cracker with slow but effective mastication.  Thin liquids elicited a cough response regardless of bolus size.  When taking individual sips of  nectar, coughing was eliminated. D/W Emily Escobar the benefit of thickening liquids to nectar for the weekend, then reassessing on Monday to determine how her swallowing presents and what that might mean for the long-term.  We discussed the pleasure Emily Escobar derives from eating and importance of avoiding diet/consistency restrictions that may negatively impact her quality of life. Emily Escobar, Emily Escobar, agree with plan. Continue regular solids; allow nectar thick liquids for now. SLP to re-assess on Monday, 11/11 after procedure. SLP Visit Diagnosis: Dysphagia, oropharyngeal phase (R13.12)    Aspiration Risk    unknown   Diet Recommendation   Nectar;Age appropriate regular  Medication Administration: Whole meds with liquid    Other  Recommendations Oral Care Recommendations: Oral care BID    Recommendations for follow up therapy are one component of a multi-disciplinary discharge planning process, led by the attending physician.  Recommendations may be updated based on patient status, additional functional criteria and insurance authorization.  Follow up Recommendations Other (comment) (tba)          Functional Status Assessment Patient has had a recent decline in their functional status and demonstrates the ability to make significant improvements in function in a reasonable and predictable amount of time.  Frequency and Duration min 2x/week  1 week       Prognosis Prognosis for improved oropharyngeal function: Good      Swallow Study   General Date of Onset: 10/22/23 HPI: 87 year old female presented to the emergency department from [facility]  nursing facility after sustaining fall. 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 Type of Study: Bedside Swallow Evaluation Previous Swallow Assessment: no Diet Prior to this Study: Regular;Thin liquids (Level 0) Temperature Spikes Noted:  No Respiratory Status: Room air History of Recent Intubation: No Behavior/Cognition: Alert;Cooperative Oral Cavity Assessment: Within Functional Limits Oral Care Completed by SLP: No Oral Cavity - Dentition: Adequate natural dentition Vision: Functional for self-feeding Self-Feeding Abilities: Needs assist;Able to feed self Patient Positioning: Upright in bed Baseline Vocal Quality: Normal Volitional Cough: Cognitively unable to elicit Volitional Swallow: Unable to elicit    Oral/Motor/Sensory Function Overall Oral Motor/Sensory Function: Within functional limits   Ice Chips Ice chips: Within functional limits   Thin Liquid Thin Liquid: Impaired Presentation: Cup;Straw Pharyngeal  Phase Impairments: Cough - Immediate    Nectar Thick Nectar Thick Liquid: Within functional limits Presentation: Cup;Straw   Honey Thick Honey Thick Liquid: Not tested   Puree Puree: Within functional limits   Solid     Solid: Within functional limits      Emily Escobar 10/23/2023,2:05 PM  Emily Folks L. Samson Frederic, MA CCC/SLP Clinical Specialist - Acute Care SLP Acute Rehabilitation Services Office number [phone]

## 2023-10-23 NOTE — TOC CAGE-AID Note (Signed)
Transition of Care [facility]) - CAGE-AID Screening   Patient Details  Name: Emily Escobar MRN: [id number] Date of Birth: 28-May-1936  Transition of Care [facility]) CM/SW Contact:    Katha Hamming, RN Phone Number: 10/23/2023, 7:02 AM   CAGE-AID Screening: Substance Abuse Screening unable to be completed due to: : Patient unable to participate

## 2023-10-23 NOTE — Progress Notes (Signed)
   Providing Compassionate, Quality Care - Together  NEUROSURGERY PROGRESS NOTE   S: No issues overnight.   O: EXAM:  BP 128/72 (BP Location: Right Arm)   Pulse 65   Temp 98 F (36.7 C) (Axillary)   Resp 18   Ht 5\' 3"  (1.6 m)   Wt 78 kg   SpO2 94%   BMI 30.46 kg/m   Awake, alert PERRL Minimally conversant Face symmetric Moves all extremities equally Follows commands x 4  ASSESSMENT:  87 y.o. 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  Please do not hesitate to call with questions or concerns.   Monia Pouch, DO Neurosurgeon [facility] [phone]

## 2023-10-23 NOTE — Plan of Care (Signed)

## 2023-10-23 NOTE — Plan of Care (Signed)

## 2023-10-23 NOTE — TOC CM/SW Note (Signed)
Transition of Care [facility]) - Inpatient Brief Assessment   Patient Details  Name: Emily Escobar MRN: [id number] Date of Birth: 19-Mar-1936  Transition of Care [facility]) CM/SW Contact:    Baldemar Lenis, LCSW Phone Number: 10/23/2023, 2:25 PM   Clinical Narrative:     Patient from [facility], not medically stable to return to ALF at this time. Should patient need a higher level of care at discharge, will need PT and OT evaluations. CSW to follow.    Transition of Care Asessment: Insurance and Status: Insurance coverage has been reviewed Patient has primary care physician: Yes Home environment has been reviewed: [employer] administrator at [employer]: No current home services Social Determinants of Health Reivew: SDOH reviewed no interventions necessary Readmission risk has been reviewed: Yes Transition of care needs: transition of care needs identified, TOC will continue to follow

## 2023-10-24 NOTE — Plan of Care (Signed)

## 2023-10-24 NOTE — Plan of Care (Signed)
Patient was much more alert and compliant than previous few days. Patient responded to treatment and ate more of her meals. Mittens were removed. Problem: Education: Goal: Knowledge of General Education information will improve Description: Including pain rating scale, medication(s)/side effects and non-pharmacologic comfort measures Outcome: Progressing   Problem: Health Behavior/Discharge Planning: Goal: Ability to manage health-related needs will improve Outcome: Progressing   Problem: Clinical Measurements: Goal: Will remain free from infection Outcome: Progressing

## 2023-10-24 NOTE — Plan of Care (Signed)

## 2023-10-24 NOTE — Progress Notes (Signed)
Based on the previous notes, there is no change in exam.  She is awake and minimally conversant but moving all extremities.  Continue current management

## 2023-10-25 NOTE — Progress Notes (Signed)
Patient ID: Emily Escobar, female   DOB: [birth year], 87 y.o.   MRN: [id number] BP (!) 192/83 (BP Location: Right Arm)   Pulse (!) 56   Temp 97.8 F (36.6 C) (Oral)   Resp 20   Ht 5\' 3"  (1.6 m)   Wt 78 kg   SpO2 100%   BMI 30.46 kg/m  Alert, being fed lunch by family Paucity of speech Family states she is at baseline Moving extremities Await mma embolization

## 2023-10-25 NOTE — Plan of Care (Signed)

## 2023-10-26 ENCOUNTER — Encounter (HOSPITAL_COMMUNITY)
Admission: EM | Disposition: A | Discharge status: Skilled Nursing Facility | Payer: Self-pay | Source: Skilled Nursing Facility | Attending: Neurological Surgery

## 2023-10-26 ENCOUNTER — Inpatient Hospital Stay (HOSPITAL_COMMUNITY): Payer: Medicare HMO | Admitting: Anesthesiology

## 2023-10-26 ENCOUNTER — Inpatient Hospital Stay (HOSPITAL_COMMUNITY): Payer: Medicare HMO

## 2023-10-26 ENCOUNTER — Encounter (HOSPITAL_COMMUNITY): Payer: Self-pay | Admitting: Neurological Surgery

## 2023-10-26 DIAGNOSIS — I6203 Nontraumatic chronic subdural hemorrhage: Secondary | ICD-10-CM

## 2023-10-26 HISTORY — PX: IR NEURO EACH ADD'L AFTER BASIC UNI RIGHT (MS): IMG5374

## 2023-10-26 HISTORY — PX: IR ANGIO INTRA EXTRACRAN SEL INTERNAL CAROTID UNI R MOD SED: IMG5362

## 2023-10-26 HISTORY — PX: IR TRANSCATH/EMBOLIZ: IMG695

## 2023-10-26 HISTORY — PX: IR ANGIO EXTERNAL CAROTID SEL EXT CAROTID UNI R MOD SED: IMG5371

## 2023-10-26 HISTORY — PX: IR ANGIOGRAM FOLLOW UP STUDY: IMG697

## 2023-10-26 HISTORY — PX: RADIOLOGY WITH ANESTHESIA: SHX6223

## 2023-10-26 LAB — POCT I-STAT, CHEM 8
BUN: 24 mg/dL — ABNORMAL HIGH (ref 8–23)
Calcium, Ion: 1.18 mmol/L (ref 1.15–1.40)
Chloride: 105 mmol/L (ref 98–111)
Creatinine, Ser: 0.5 mg/dL (ref 0.44–1.00)
Glucose, Bld: 99 mg/dL (ref 70–99)
HCT: 36 % (ref 36.0–46.0)
Hemoglobin: 12.2 g/dL (ref 12.0–15.0)
Potassium: 3.7 mmol/L (ref 3.5–5.1)
Sodium: 141 mmol/L (ref 135–145)
TCO2: 27 mmol/L (ref 22–32)

## 2023-10-26 LAB — SURGICAL PCR SCREEN
MRSA, PCR: NEGATIVE
Staphylococcus aureus: NEGATIVE

## 2023-10-26 SURGERY — IR WITH ANESTHESIA
Anesthesia: General

## 2023-10-26 MED ORDER — HEPARIN SODIUM (PORCINE) 1000 UNIT/ML IJ SOLN
INTRAMUSCULAR | Status: DC | PRN
  Administered 2023-10-26: 2000 [IU] via INTRAVENOUS

## 2023-10-26 MED ORDER — ONDANSETRON HCL 4 MG/2ML IJ SOLN
INTRAMUSCULAR | Status: DC | PRN
  Administered 2023-10-26: 4 mg via INTRAVENOUS

## 2023-10-26 MED ORDER — IOHEXOL 300 MG/ML  SOLN
150.0000 mL | Freq: Once | INTRAMUSCULAR | Status: AC | PRN
  Administered 2023-10-26: 75 mL via INTRA_ARTERIAL

## 2023-10-26 MED ORDER — CLEVIDIPINE BUTYRATE 0.5 MG/ML IV EMUL: 0.0000 mg/h | INTRAVENOUS | Status: DC

## 2023-10-26 MED ORDER — FENTANYL CITRATE (PF) 100 MCG/2ML IJ SOLN
INTRAMUSCULAR | Status: DC | PRN
  Administered 2023-10-26: 25 ug via INTRAVENOUS

## 2023-10-26 MED ORDER — PROPOFOL 10 MG/ML IV BOLUS
INTRAVENOUS | Status: DC | PRN
  Administered 2023-10-26 (×3): 50 mg via INTRAVENOUS

## 2023-10-26 MED ORDER — CLEVIDIPINE BUTYRATE 0.5 MG/ML IV EMUL
INTRAVENOUS | Status: AC
  Filled 2023-10-26: qty 50

## 2023-10-26 MED ORDER — CLEVIDIPINE BUTYRATE 0.5 MG/ML IV EMUL
INTRAVENOUS | Status: DC | PRN
  Administered 2023-10-26: 2 mg/h via INTRAVENOUS

## 2023-10-26 MED ORDER — FENTANYL CITRATE (PF) 100 MCG/2ML IJ SOLN
INTRAMUSCULAR | Status: AC
  Filled 2023-10-26: qty 2

## 2023-10-26 MED ORDER — DEXAMETHASONE SODIUM PHOSPHATE 10 MG/ML IJ SOLN
INTRAMUSCULAR | Status: DC | PRN
  Administered 2023-10-26: 10 mg via INTRAVENOUS

## 2023-10-26 MED ORDER — SUGAMMADEX SODIUM 200 MG/2ML IV SOLN
INTRAVENOUS | Status: DC | PRN
  Administered 2023-10-26: 200 mg via INTRAVENOUS

## 2023-10-26 MED ORDER — MUPIROCIN 2 % EX OINT
1.0000 | TOPICAL_OINTMENT | Freq: Two times a day (BID) | CUTANEOUS | Status: AC
  Administered 2023-10-26 – 2023-10-30 (×9): 1 via NASAL
  Filled 2023-10-26 (×5): qty 22

## 2023-10-26 MED ORDER — ROCURONIUM BROMIDE 10 MG/ML (PF) SYRINGE
PREFILLED_SYRINGE | INTRAVENOUS | Status: DC | PRN
  Administered 2023-10-26: 30 mg via INTRAVENOUS
  Administered 2023-10-26: 20 mg via INTRAVENOUS

## 2023-10-26 MED ORDER — LIDOCAINE 2% (20 MG/ML) 5 ML SYRINGE
INTRAMUSCULAR | Status: DC | PRN
  Administered 2023-10-26: 40 mg via INTRAVENOUS

## 2023-10-26 MED ORDER — SODIUM CHLORIDE 0.9 % IV SOLN: INTRAVENOUS | Status: DC | PRN

## 2023-10-26 NOTE — Anesthesia Procedure Notes (Addendum)
Arterial Line Insertion Start/End11/10/2023 9:03 AM, 10/26/2023 9:04 AM Performed by: Shelton Silvas, MD, anesthesiologist  Patient location: Pre-op. Preanesthetic checklist: patient identified, IV checked, site marked, risks and benefits discussed, surgical consent, monitors and equipment checked, pre-op evaluation, timeout performed and anesthesia consent Lidocaine 1% used for infiltration Left, radial was placed Catheter size: 20 G Hand hygiene performed , maximum sterile barriers used  and Seldinger technique used Allen's test indicative of satisfactory collateral circulation Attempts: 1 Procedure performed without using ultrasound guided technique. Following insertion, dressing applied and Biopatch. Post procedure assessment: normal and unchanged  Patient tolerated the procedure well with no immediate complications.

## 2023-10-26 NOTE — Progress Notes (Signed)
  NEUROSURGERY PROGRESS NOTE   No issues overnight.  EXAM:  BP (!) 167/58 Comment: R lower leg  Pulse (!) 53   Temp (!) 97.5 F (36.4 C)   Resp (!) 24   Ht 5\' 3"  (1.6 m)   Wt 78 kg   SpO2 95%   BMI 30.46 kg/m   Awake, alert, confused Largely non-verbal Moving all extremities spontaneously, appears symmetric  IMPRESSION:  87 y.o. 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 Therefore appears to be good candidate for MMA embolization.  PLAN: - Will proceed with right MMA embolization   I have reviewed the indications for the procedure as well as the details of the procedure and the expected postoperative course and recovery at length with the patient's daughter-in-law and son. We have also reviewed in detail the risks, benefits, and alternatives to the procedure. Specifically, I had discussed with the daughter-in-law risks including stroke, arterial dissection, contrast nephropathy, and groin hematoma. We also discussed potential need for addition treatment/surgery despite the embolization. All questions were answered and family provided informed consent to proceed on the patient's behalf.  Lisbeth Renshaw, MD [facility]

## 2023-10-26 NOTE — Anesthesia Procedure Notes (Signed)
Procedure Name: Intubation Date/Time: 10/26/2023 9:00 AM  Performed by: Randon Goldsmith, CRNAPre-anesthesia Checklist: Patient identified, Emergency Drugs available, Suction available and Patient being monitored Patient Re-evaluated:Patient Re-evaluated prior to induction Oxygen Delivery Method: Circle system utilized Preoxygenation: Pre-oxygenation with 100% oxygen Induction Type: IV induction Ventilation: Mask ventilation without difficulty Laryngoscope Size: Mac and 3 Grade View: Grade I Tube type: Oral Tube size: 7.0 mm Number of attempts: 1 Airway Equipment and Method: Stylet and Oral airway Placement Confirmation: ETT inserted through vocal cords under direct vision, positive ETCO2 and breath sounds checked- equal and bilateral Secured at: 21 cm Tube secured with: Tape Dental Injury: Teeth and Oropharynx as per pre-operative assessment  Comments: Patient confused during preop assessment; unable to take out dentures. Dentures remained in place during intubation and remain in preoperative condition after intubation.

## 2023-10-26 NOTE — Sedation Documentation (Signed)
Right knee immobilizer placed post procedure due to right groin access.

## 2023-10-26 NOTE — Anesthesia Postprocedure Evaluation (Signed)
Anesthesia Post Note  Patient: Emily Escobar  Procedure(s) Performed: IR WITH ANESTHESIA     Patient location during evaluation: PACU Anesthesia Type: General Level of consciousness: responds to stimulation Pain management: pain level controlled Vital Signs Assessment: post-procedure vital signs reviewed and stable Respiratory status: spontaneous breathing, nonlabored ventilation, respiratory function stable and patient connected to nasal cannula oxygen Cardiovascular status: blood pressure returned to baseline and stable Postop Assessment: no apparent nausea or vomiting Anesthetic complications: no   No notable events documented.  Last Vitals:  Vitals:   10/26/23 1100 10/26/23 1115  BP: (!) 152/69 (!) 149/69  Pulse: (!) 58 (!) 57  Resp: 18 10  Temp:  (!) 36.4 C  SpO2: 95% 95%    Last Pain:  Vitals:   10/26/23 1030  TempSrc:   PainSc: Asleep                 Earl Lites P Aaleyah Witherow

## 2023-10-26 NOTE — Plan of Care (Signed)

## 2023-10-26 NOTE — Progress Notes (Signed)
   10/26/23 1144  Vitals  Temp 97.6 F (36.4 C)  Temp Source Axillary  BP (!) 167/69  MAP (mmHg) 97  BP Location Left Arm  BP Method Automatic  Patient Position (if appropriate) Lying  Pulse Rate (!) 56  Pulse Rate Source Dinamap  ECG Heart Rate (!) 58  Resp 12  Level of Consciousness  Level of Consciousness Alert  MEWS COLOR  MEWS Score Color Green  Oxygen Therapy  SpO2 96 %  O2 Device Nasal Cannula  O2 Flow Rate (L/min) 3 L/min  PCA/Epidural/Spinal Assessment  Respiratory Pattern Regular;Unlabored  Glasgow Coma Scale  Eye Opening 3  Best Verbal Response (NON-intubated) 2  Best Motor Response 5  Glasgow Coma Scale Score 10  MEWS Score  MEWS Temp 0  MEWS Systolic 0  MEWS Pulse 0  MEWS RR 1  MEWS LOC 0  MEWS Score 1   Readmitted from PACU

## 2023-10-26 NOTE — Transfer of Care (Signed)
Immediate Anesthesia Transfer of Care Note  Patient: Emily Escobar  Procedure(s) Performed: IR WITH ANESTHESIA  Patient Location: PACU  Anesthesia Type:General  Level of Consciousness: obtunded  Airway & Oxygen Therapy: Patient Spontanous Breathing and Patient connected to face mask oxygen  Post-op Assessment: Report given to RN and Post -op Vital signs reviewed and stable  Post vital signs: Reviewed and stable  Last Vitals:  Vitals Value Taken Time  BP 140/65 10/26/23 1035  Temp    Pulse 69 10/26/23 1042  Resp 18 10/26/23 1042  SpO2 92 % 10/26/23 1042  Vitals shown include unfiled device data.  Last Pain:  Vitals:   10/26/23 0600  TempSrc:   PainSc: 0-No pain       Patient with minimal neuro exam upon arrival to PACU. Patient will withdraw to pain but does not follow any commands.  Dr. Nance Pew at bedside.   Complications: No notable events documented.

## 2023-10-26 NOTE — Anesthesia Preprocedure Evaluation (Addendum)
Anesthesia Evaluation  Patient identified by MRN, date of birth, ID band Patient awake    Reviewed: Allergy & Precautions, NPO status , Patient's Chart, lab work & pertinent test results  Airway Mallampati: Unable to assess       Dental  (+) Upper Dentures, Dental Advisory Given   Pulmonary neg pulmonary ROS    + decreased breath sounds      Cardiovascular hypertension, Pt. on home beta blockers and Pt. on medications  Rhythm:Regular Rate:Bradycardia     Neuro/Psych  PSYCHIATRIC DISORDERS Anxiety Depression   Dementia    GI/Hepatic Neg liver ROS,GERD  Medicated,,  Endo/Other  negative endocrine ROS    Renal/GU negative Renal ROS     Musculoskeletal negative musculoskeletal ROS (+)    Abdominal   Peds  Hematology negative hematology ROS (+)   Anesthesia Other Findings   Reproductive/Obstetrics                             Anesthesia Physical Anesthesia Plan  ASA: 4  Anesthesia Plan: General   Post-op Pain Management: Tylenol PO (pre-op)*   Induction: Intravenous  PONV Risk Score and Plan: 4 or greater and Ondansetron and Treatment may vary due to age or medical condition  Airway Management Planned: Oral ETT  Additional Equipment: None  Intra-op Plan:   Post-operative Plan: Extubation in OR  Informed Consent: I have reviewed the patients History and Physical, chart, labs and discussed the procedure including the risks, benefits and alternatives for the proposed anesthesia with the patient or authorized representative who has indicated his/her understanding and acceptance.     Consent reviewed with POA and History available from chart only  Plan Discussed with: CRNA  Anesthesia Plan Comments:         Anesthesia Quick Evaluation

## 2023-10-26 NOTE — Op Note (Signed)
  NEUROSURGERY BRIEF OPERATIVE  NOTE   PREOP DX: Right cSDH  POSTOP DX: Same  PROCEDURE:Onyx Embolization of right middle menningeal artery  SURGEON: Dr. Lisbeth Renshaw, MD  ANESTHESIA: GETA  APPROACH: Right trans-femoral  EBL: Minimal  SPECIMENS: None  COMPLICATIONS: None  CONDITION: Stable to recovery  FINDINGS (Full report in [facility]): 1. Successful Onyx embolization of right MMA   Lisbeth Renshaw, MD [facility]

## 2023-10-26 NOTE — Progress Notes (Signed)
SLP Cancellation Note  Patient Details Name: Emily Escobar MRN: [id number] DOB: 11-Feb-1936   Cancelled treatment:  Pt lethargic after surgery. Spoke with son and dtr-in-law. Will f/u next date to re-assess swallowing. Diet post-surgery should be regular/nectar thick liquids. Orders changed in chart to reflect necessary consistency. SLP will follow.  Annastasia Haskins L. Samson Frederic, MA CCC/SLP Clinical Specialist - Acute Care SLP Acute Rehabilitation Services Office number [phone]          Blenda Mounts Laurice 10/26/2023, 2:34 PM

## 2023-10-27 ENCOUNTER — Encounter (HOSPITAL_COMMUNITY): Payer: Self-pay | Admitting: Neurosurgery

## 2023-10-27 MED ORDER — ORAL CARE MOUTH RINSE
15.0000 mL | OROMUCOSAL | Status: DC
  Administered 2023-10-27 – 2023-11-04 (×24): 15 mL via OROMUCOSAL

## 2023-10-27 MED ORDER — ORAL CARE MOUTH RINSE: 15.0000 mL | OROMUCOSAL | Status: DC | PRN

## 2023-10-27 NOTE — Plan of Care (Signed)

## 2023-10-27 NOTE — Care Management Important Message (Signed)
Important Message  Patient Details  Name: Emily Escobar MRN: [id number] Date of Birth: August 06, 1936   Important Message Given:  Yes - Medicare IM     Dorena Bodo 10/27/2023, 1:02 PM

## 2023-10-27 NOTE — Plan of Care (Signed)

## 2023-10-27 NOTE — Progress Notes (Signed)
Speech Language Pathology Treatment: Dysphagia  Patient Details Name: Emily Escobar MRN: [id number] DOB: 1936/08/17 Today's Date: 10/27/2023 Time: 0272-5366 SLP Time Calculation (min) (ACUTE ONLY): 17 min  Assessment / Plan / Recommendation Clinical Impression  Pt awake and alert, interacting with daughter and granddaughter present in room. They report no difficulty over the weekend with diet of regular textures and nectar thick liquids. Observed pt with extensive trials of thin liquids with no overt s/s of aspiration. She independently initiates single, controlled sips via straw. Pt thoroughly masticated and cleared mild oral residue with solids. Recommend continuing regular texture solids and upgrading to thin liquids. She will require full supervision to provide cueing to use single, controlled sips and to assist with feeding. Signs posted at [facility]. Rate control is likely beneficial to performance and if pt does become impulsive with drinking, recommend increased cueing from family and nursing staff. Discussed with pt's family members, who verbalize agreement. SLP will f/u given potential for fluctuating mentation.   HPI HPI: 87 year old female presented to the emergency department from [facility] nursing facility after sustaining a fall. CT head showed acute on chronic right subdural hematoma with midline shift. Pt with hx of progressive cognitive decline, anxiety, depression, memory loss, falls.      SLP Plan  Continue with current plan of care      Recommendations for follow up therapy are one component of a multi-disciplinary discharge planning process, led by the attending physician.  Recommendations may be updated based on patient status, additional functional criteria and insurance authorization.    Recommendations  Diet recommendations: Regular;Thin liquid Liquids provided via: Cup;Straw Medication Administration: Crushed with puree Supervision: Staff to assist with self feeding;Trained  caregiver to feed patient;Full supervision/cueing for compensatory strategies Compensations: Minimize environmental distractions;Slow rate;Small sips/bites Postural Changes and/or Swallow Maneuvers: Seated upright 90 degrees                  Oral care BID   Frequent or constant Supervision/Assistance Dysphagia, oropharyngeal phase (R13.12)     Continue with current plan of care    Gwynneth Aliment, M.A., CF-SLP Speech Language Pathology, Acute Rehabilitation Services  Secure Chat preferred [phone]   10/27/2023, 1:47 PM

## 2023-10-28 NOTE — Evaluation (Signed)
Occupational Therapy Evaluation Patient Details Name: Emily Escobar MRN: [id number] DOB: July 25, 1936 Today's Date: 10/28/2023   History of Present Illness 87 y.o. female presents to [facility] hospital on 10/22/2023 after a fall. Imaging notable for R SDH with midline shift. Pt underwent R MMA embolization on 11/11. PMH includes anxiety, depression, HTN, macular degeneration, parkinsonism.   Clinical Impression   Emily Escobar was evaluated s/p the above admission list. She is from ALF and has daily assist for all mobility and ADLs at baseline, per PCA pt has has several recent falls. Upon evaluation the pt was limited by impaired cognition, communication, weakness, fatigue and limited activity tolerance. Overall the evaluation was limited to bed level mobility and pt needed maximal multimodal cues to participate and mod physical assist to roll. Pt's PCA present and reports she is much weaker than baseline. Due to the deficits listed below the pt also needs up to total A for LB ADLs and up to mod A for LUB ADLs. Pt will benefit from continued acute OT services and skilled inpatient follow up therapy, <3 hours/day.        If plan is discharge home, recommend the following: A lot of help with walking and/or transfers;A lot of help with bathing/dressing/bathroom;Assistance with cooking/housework;Direct supervision/assist for medications management;Direct supervision/assist for financial management;Assist for transportation;Help with stairs or ramp for entrance    Functional Status Assessment  Patient has had a recent decline in their functional status and demonstrates the ability to make significant improvements in function in a reasonable and predictable amount of time.  Equipment Recommendations  None recommended by OT       Precautions / Restrictions Precautions Precautions: Fall Restrictions Weight Bearing Restrictions: No      Mobility Bed Mobility Overal bed mobility: Needs Assistance Bed Mobility:  Rolling Rolling: Mod assist         General bed mobility comments: maximal multimodal cues needed. evaluation limited to bed level rolling only.    Transfers                              ADL either performed or assessed with clinical judgement   ADL Overall ADL's : Needs assistance/impaired Eating/Feeding: Minimal assistance;Bed level Eating/Feeding Details (indicate cue type and reason): supported upright in bed Grooming: Moderate assistance;Sitting   Upper Body Bathing: Moderate assistance   Lower Body Bathing: Maximal assistance   Upper Body Dressing : Moderate assistance   Lower Body Dressing: Maximal assistance   Toilet Transfer: Moderate assistance;Stand-pivot;BSC/3in1   Toileting- Clothing Manipulation and Hygiene: Total assistance       Functional mobility during ADLs: Moderate assistance General ADL Comments: maximal multimodal cues needed for all tasks     Vision Baseline Vision/History: 0 No visual deficits Vision Assessment?: No apparent visual deficits Additional Comments: difficult to assess     Perception Perception: Not tested       Praxis Praxis: Not tested       Pertinent Vitals/Pain Pain Assessment Pain Assessment: No/denies pain     Extremity/Trunk Assessment Upper Extremity Assessment Upper Extremity Assessment: Generalized weakness   Lower Extremity Assessment Lower Extremity Assessment: Generalized weakness   Cervical / Trunk Assessment Cervical / Trunk Assessment: Kyphotic   Communication Communication Communication: Difficulty following commands/understanding;Difficulty communicating thoughts/reduced clarity of speech Cueing Techniques: Verbal cues   Cognition Arousal: Alert Behavior During Therapy: WFL for tasks assessed/performed Overall Cognitive Status: History of cognitive impairments - at baseline Area of Impairment: Memory, Following commands,  Safety/judgement, Problem solving                      Memory: Decreased short-term memory Following Commands: Follows one step commands with increased time Safety/Judgement: Decreased awareness of safety, Decreased awareness of deficits   Problem Solving: Slow processing, Difficulty sequencing General Comments: hx of cog impiarment at baseline, pt typically oriented to self and familiar people only. per PCA, pt is able to hold short conversations and intermittently make her needs known. Pt remenered this therapists name throughout the session as her son and husbands names are "Emily Escobar"     General Comments  VSS, PCA present adn providing information     Home Living Family/patient expects to be discharged to:: Assisted living Living Arrangements: Alone Available Help at Discharge: Personal care attendant Type of Home: Other(Comment) Home Access: Level entry     Home Layout: One level     Bathroom Shower/Tub: Producer, television/film/video: Handicapped height Bathroom Accessibility: Yes   Home Equipment: Rollator (4 wheels);Wheelchair - manual   Additional Comments: Pt's contracted PCA present, PCA is present "most days" of the week      Prior Functioning/Environment Prior Level of Function : Needs assist             Mobility Comments: many falls in the last few months. Pt has been requiring assistance for transfers sit to stand. Still ambulates some with assist and rollator but more frequently is pushed in a wheelchair by caregivers ADLs Comments: cognitive and physical assist for all ADLs        OT Problem List: Decreased strength;Decreased activity tolerance;Decreased range of motion;Impaired balance (sitting and/or standing);Decreased cognition;Decreased safety awareness;Decreased knowledge of use of DME or AE;Decreased knowledge of precautions      OT Treatment/Interventions: Self-care/ADL training;Therapeutic exercise;DME and/or AE instruction;Therapeutic activities;Patient/family education;Balance training     OT Goals(Current goals can be found in the care plan section) Acute Rehab OT Goals Patient Stated Goal: for PCA to spend the night OT Goal Formulation: With patient Time For Goal Achievement: 11/11/23 Potential to Achieve Goals: Fair ADL Goals Pt Will Perform Grooming: with supervision;sitting Pt Will Perform Upper Body Dressing: with min assist;sitting Pt Will Transfer to Toilet: with min assist Pt Will Perform Toileting - Clothing Manipulation and hygiene: with mod assist;sitting/lateral leans Additional ADL Goal #1: pt will complete bed mobility with min A as a precursor to ADLs  OT Frequency: Min 1X/week       AM-PAC OT "6 Clicks" Daily Activity     Outcome Measure Help from another person eating meals?: A Little Help from another person taking care of personal grooming?: A Lot Help from another person toileting, which includes using toliet, bedpan, or urinal?: A Lot Help from another person bathing (including washing, rinsing, drying)?: A Lot Help from another person to put on and taking off regular upper body clothing?: A Lot Help from another person to put on and taking off regular lower body clothing?: A Lot 6 Click Score: 13   End of Session Nurse Communication: Mobility status  Activity Tolerance: Patient limited by fatigue Patient left: in bed;with call bell/phone within reach;with bed alarm set;with family/visitor present  OT Visit Diagnosis: Unsteadiness on feet (R26.81);Other abnormalities of gait and mobility (R26.89);Muscle weakness (generalized) (M62.81)                Time: 1610-9604 OT Time Calculation (min): 18 min Charges:  OT General Charges $OT Visit: 1 Visit OT Evaluation $OT  Eval Moderate Complexity: 1 Mod  Derenda Mis, OTR/L Acute Rehabilitation Services Office [phone] Secure Chat Communication Preferred   Donia Pounds 10/28/2023, 4:02 PM

## 2023-10-28 NOTE — TOC Initial Note (Signed)
Transition of Care [facility]) - Initial/Assessment Note    Patient Details  Name: Emily Escobar MRN: [id number] Date of Birth: 1936-06-13  Transition of Care [facility]) CM/SW Contact:    Baldemar Lenis, LCSW Phone Number: 10/28/2023, 2:12 PM  Clinical Narrative:   CSW spoke with daughter-in-law Eunice Blase about disposition. Debbie explained that patient is not yet at her baseline, family is interested in SNF at discharge. CSW discussed barriers in looking at SNF placement, will need therapy orders and evaluations and insurance approval. Patient's son and DIL recently moved to [place], interested in [facility] in [place], [place]. CSW to send referral after therapy has seen the patient. CSW discussed with RN about getting therapy orders from MD. CSW to follow.                Expected Discharge Plan: Skilled Nursing Facility Barriers to Discharge: Continued Medical Work up, English as a second language teacher   Patient Goals and CMS Choice Patient states their goals for this hospitalization and ongoing recovery are:: patient unable to participate in goal setting, not fully oriented CMS Medicare.gov Compare Post Acute Care list provided to:: Patient Represenative (must comment) Choice offered to / list presented to : Adult Children [facility] ownership interest in [facility].provided to:: Adult Children    Expected Discharge Plan and Services     Post Acute Care Choice: Skilled Nursing Facility Living arrangements for the past 2 months: Assisted Living Facility                                      Prior Living Arrangements/Services Living arrangements for the past 2 months: Assisted Living Facility Lives with:: Facility Resident Patient language and need for interpreter reviewed:: No Do you feel safe going back to the place where you live?: Yes      Need for Family Participation in Patient Care: Yes (Comment) Care giver support system in place?: No (comment) Current home services:  DME Criminal Activity/Legal Involvement Pertinent to Current Situation/Hospitalization: No - Comment as needed  Activities of Daily Living   ADL Screening (condition at time of admission) Independently performs ADLs?: No Does the patient have a NEW difficulty with bathing/dressing/toileting/self-feeding that is expected to last >3 days?: No Does the patient have a NEW difficulty with getting in/out of bed, walking, or climbing stairs that is expected to last >3 days?: No Does the patient have a NEW difficulty with communication that is expected to last >3 days?: No Is the patient deaf or have difficulty hearing?: Yes (has hearing aids at ALF) Does the patient have difficulty seeing, even when wearing glasses/contacts?: Yes Does the patient have difficulty concentrating, remembering, or making decisions?: Yes  Permission Sought/Granted Permission sought to share information with : Facility Medical sales representative, Family Supports Permission granted to share information with : Yes, Verbal Permission Granted  Share Information with NAME: Wendall Stade  Permission granted to share info w AGENCY: SNF  Permission granted to share info w Relationship: Children     Emotional Assessment   Attitude/Demeanor/Rapport: Unable to Assess Affect (typically observed): Unable to Assess Orientation: : Oriented to Self Alcohol / Substance Use: Not Applicable Psych Involvement: No (comment)  Admission diagnosis:  Subdural hematoma (HCC) [S06.5XAA] Patient Active Problem List   Diagnosis Date Noted   Subdural hematoma (HCC) 10/22/2023   Rib fractures 05/28/2023   Parkinsonism (HCC) 09/23/2022   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  Cognitive impairment 04/02/2021   Gait abnormality 04/02/2021   Slurred speech 04/02/2021   Retinal pigment epithelium serous detachment, 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 [place] [address] Phone: [phone] Fax: [phone]     Social Determinants of Health (SDOH) Social History: SDOH Screenings   Food Insecurity: No Food Insecurity (10/22/2023)  Housing: Low Risk  (10/22/2023)  Transportation Needs: No Transportation Needs (10/22/2023)  Utilities: Not At Risk (10/22/2023)  Social Connections: Unknown (04/29/2022)   [facility] from [facility]  Tobacco Use: Low Risk  (10/26/2023)   SDOH Interventions:     Readmission Risk Interventions     No data to display

## 2023-10-28 NOTE — Plan of Care (Signed)
  Problem: Education: Goal: Knowledge of General Education information will improve Description: Including pain rating scale, medication(s)/side effects and non-pharmacologic comfort measures Outcome: Progressing   Problem: Activity: Goal: Risk for activity intolerance will decrease Outcome: Progressing   Problem: Nutrition: Goal: Adequate nutrition will be maintained Outcome: Progressing   Problem: Pain Management: Goal: General experience of comfort will improve Outcome: Progressing

## 2023-10-28 NOTE — Progress Notes (Signed)
  NEUROSURGERY PROGRESS NOTE   No issues overnight. Pts son and long-term caregiver at bedside. They report the patient is essentially at pre-admission baseline.   EXAM:  BP (!) 160/71 (BP Location: Right Arm)   Pulse (!) 55   Temp 97.6 F (36.4 C) (Axillary)   Resp 16   Ht 5\' 3"  (1.6 m)   Wt 78 kg   SpO2 95%   BMI 30.46 kg/m   Awake, alert Answers simple questions MAE symmetrically Right groin soft  IMPRESSION:  87 y.o. female  s/p right MMA embolization, at baseline with advanced dementia.  PLAN: - Cont PT/OT/SLP - Will likely need SNF prior to return back to facility where she was at an assisted-living level of care.   Lisbeth Renshaw, MD [facility]

## 2023-10-28 NOTE — Evaluation (Signed)
Physical Therapy Evaluation Patient Details Name: Emily Escobar MRN: [id number] DOB: 02-Dec-1936 Today's Date: 10/28/2023  History of Present Illness  87 y.o. female presents to [facility] hospital on 10/22/2023 after a fall. Imaging notable for R SDH with midline shift. Pt underwent R MMA embolization on 11/11. PMH includes anxiety, depression, HTN, macular degeneration, parkinsonism.  Clinical Impression  Pt presents to PT with deficits in functional mobility, strength, power, gait, balance, communication and cognition. Pt demonstrates significant generalized weakness at this time, requiring physical assistance for all aspects of functional mobility and often with an anterior lean in sitting which she is unable to self-correct. Pt remains at a high risk for falls and may benefit from short term inpatient PT placement, <3 hours daily, in an effort to reduce risk for falls and caregiver burden.      If plan is discharge home, recommend the following: A lot of help with walking and/or transfers;A lot of help with bathing/dressing/bathroom;Assistance with cooking/housework;Direct supervision/assist for medications management;Direct supervision/assist for financial management;Assist for transportation;Help with stairs or ramp for entrance   Can travel by private vehicle   No    Equipment Recommendations  (defer to post-acute setting)  Recommendations for Other Services       Functional Status Assessment Patient has had a recent decline in their functional status and demonstrates the ability to make significant improvements in function in a reasonable and predictable amount of time.     Precautions / Restrictions Precautions Precautions: Fall Restrictions Weight Bearing Restrictions: No      Mobility  Bed Mobility Overal bed mobility: Needs Assistance Bed Mobility: Supine to Sit, Sit to Supine     Supine to sit: Mod assist, HOB elevated, Used rails Sit to supine: Max assist   General bed  mobility comments: increased time to initiate, assist to pivot hips and to assist in trunk elevation    Transfers Overall transfer level: Needs assistance Equipment used: Rolling walker (2 wheels), 1 person hand held assist Transfers: Sit to/from Stand Sit to Stand: Mod assist           General transfer comment: verbal/tactile cues to initiate trunk flexion, physical assist to power up into standing.    Ambulation/Gait Ambulation/Gait assistance: Min assist Gait Distance (Feet): 1 Feet Assistive device: Rolling walker (2 wheels) Gait Pattern/deviations: Shuffle Gait velocity: reduced Gait velocity interpretation: <1.31 ft/sec, indicative of household ambulator   General Gait Details: short shuffling steps, reduced foot clearance bilaterally  Stairs            Wheelchair Mobility     Tilt Bed    Modified Rankin (Stroke Patients Only)       Balance Overall balance assessment: Needs assistance Sitting-balance support: No upper extremity supported, Feet supported Sitting balance-Leahy Scale: Poor Sitting balance - Comments: left lateral lean initially, progressing to an anterior lean with fatigue Postural control: Left lateral lean Standing balance support: Bilateral upper extremity supported, Reliant on assistive device for balance Standing balance-Leahy Scale: Poor Standing balance comment: CGA, tactile cues for posture                             Pertinent Vitals/Pain Pain Assessment Pain Assessment: No/denies pain    Home Living Family/patient expects to be discharged to:: Assisted living Living Arrangements: Alone Available Help at Discharge: Personal care attendant Type of Home: Other(Comment) Home Access: Level entry       Home Layout: One level Home Equipment: Rollator (4  wheels);Wheelchair - manual Additional Comments: Pt's contracted PCA present, PCA is present "most days" of the week    Prior Function Prior Level of Function :  Needs assist             Mobility Comments: many falls in the last few months. Pt has been requiring assistance for transfers sit to stand. Still ambulates some with assist and rollator but more frequently is pushed in a wheelchair by caregivers ADLs Comments: cognitive and physical assist for all ADLs     Extremity/Trunk Assessment   Upper Extremity Assessment Upper Extremity Assessment: Generalized weakness    Lower Extremity Assessment Lower Extremity Assessment: Generalized weakness    Cervical / Trunk Assessment Cervical / Trunk Assessment: Kyphotic  Communication   Communication Communication: Difficulty following commands/understanding;Difficulty communicating thoughts/reduced clarity of speech Cueing Techniques: Verbal cues  Cognition Arousal: Alert Behavior During Therapy: WFL for tasks assessed/performed Overall Cognitive Status: Impaired/Different from baseline Area of Impairment: Memory, Following commands, Safety/judgement, Problem solving                     Memory: Decreased short-term memory Following Commands: Follows one step commands with increased time Safety/Judgement: Decreased awareness of safety, Decreased awareness of deficits   Problem Solving: Slow processing, Difficulty sequencing          General Comments General comments (skin integrity, edema, etc.): VSS, PCA present adn providing information    Exercises     Assessment/Plan    PT Assessment Patient needs continued PT services  PT Problem List Decreased strength;Decreased activity tolerance;Decreased balance;Decreased mobility;Decreased cognition;Decreased knowledge of use of DME;Decreased safety awareness;Decreased knowledge of precautions       PT Treatment Interventions DME instruction;Gait training;Functional mobility training;Therapeutic activities;Therapeutic exercise;Balance training;Neuromuscular re-education;Cognitive remediation;Patient/family education    PT  Goals (Current goals can be found in the Care Plan section)  Acute Rehab PT Goals Patient Stated Goal: to improve strength and reduce falls risk PT Goal Formulation: With patient Time For Goal Achievement: 11/11/23 Potential to Achieve Goals: Fair    Frequency Min 1X/week     Co-evaluation               AM-PAC PT "6 Clicks" Mobility  Outcome Measure Help needed turning from your back to your side while in a flat bed without using bedrails?: A Lot Help needed moving from lying on your back to sitting on the side of a flat bed without using bedrails?: A Lot Help needed moving to and from a bed to a chair (including a wheelchair)?: A Lot Help needed standing up from a chair using your arms (e.g., wheelchair or bedside chair)?: A Little Help needed to walk in hospital room?: Total Help needed climbing 3-5 steps with a railing? : Total 6 Click Score: 11    End of Session Equipment Utilized During Treatment: Gait belt Activity Tolerance: Patient tolerated treatment well Patient left: in bed;with call bell/phone within reach;with bed alarm set;with family/visitor present Nurse Communication: Mobility status PT Visit Diagnosis: Other abnormalities of gait and mobility (R26.89);Muscle weakness (generalized) (M62.81)    Time: 1410-1443 PT Time Calculation (min) (ACUTE ONLY): 33 min   Charges:   PT Evaluation $PT Eval Low Complexity: 1 Low   PT General Charges $$ ACUTE PT VISIT: 1 Visit         Arlyss Gandy, PT, DPT Acute Rehabilitation Office [phone]   Arlyss Gandy 10/28/2023, 4:23 PM

## 2023-10-28 NOTE — Progress Notes (Signed)
Speech Language Pathology Treatment: Dysphagia  Patient Details Name: Emily Escobar MRN: [id number] DOB: December 25, 1935 Today's Date: 10/28/2023 Time: 4098-1191 SLP Time Calculation (min) (ACUTE ONLY): 16 min  Assessment / Plan / Recommendation Clinical Impression  Observed pt with trials of thin liquids and regular solids. When allowed to take consecutive sips via straw, pt had an immediate cough x2. This was resolved when cued to take single, controlled sips and SLP manually removing straw after single sip. With solids, pt continues to have mild oral residue that is cleared with use of a liquid wash. Provided education regarding cueing for rate control and using a liquid wash. Updated sign at [facility]. Recommend continuing current diet with full supervision and assistance for feeding. Pt's son verbalizes agreement. No further SLP f/u is needed at this time. Will s/o.    HPI HPI: 87 year old female presented to the emergency department from [facility] nursing facility after sustaining fall. 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      SLP Plan  Continue with current plan of care      Recommendations for follow up therapy are one component of a multi-disciplinary discharge planning process, led by the attending physician.  Recommendations may be updated based on patient status, additional functional criteria and insurance authorization.    Recommendations  Diet recommendations: Regular;Thin liquid Liquids provided via: Cup;Straw Medication Administration: Crushed with puree Supervision: Staff to assist with self feeding;Trained caregiver to feed patient;Full supervision/cueing for compensatory strategies Compensations: Minimize environmental distractions;Slow rate;Small sips/bites Postural Changes and/or Swallow Maneuvers: Seated upright 90 degrees                  Oral  care BID   Frequent or constant Supervision/Assistance Dysphagia, oropharyngeal phase (R13.12)     Continue with current plan of care     Gwynneth Aliment, M.A., CF-SLP Speech Language Pathology, Acute Rehabilitation Services  Secure Chat preferred [phone]   10/28/2023, 4:18 PM

## 2023-10-29 NOTE — Progress Notes (Signed)
Name: Emily Escobar DOB: [birth year]  Please be advised that the above-named patient will require a short-term nursing home stay -- anticipated 30 days or less for rehabilitation and strengthening. The plan is for return home.

## 2023-10-29 NOTE — Progress Notes (Signed)
Physical Therapy Treatment Patient Details Name: Emily Escobar MRN: [id number] DOB: November 08, 1936 Today's Date: 10/29/2023   History of Present Illness 87 y.o. female presents to [facility] hospital on 10/22/2023 after a fall. Imaging notable for R SDH with midline shift. Pt underwent R MMA embolization on 11/11. PMH includes anxiety, depression, HTN, macular degeneration, parkinsonism.    PT Comments  Pt greeted resting in bed, eager for OOB mobility and demonstrating steady progress towards acute goals. Pt requiring mod A for bed mobility and transfers to stand and min A for short forward gait in room with RW for support. Pt unable to sequencing retro stepping back to bed, recliner provided and pt agreeable to time up in chair at end of session. Pt able to follow commands for LE therex with cues for technique. Current plan remains appropriate to address deficits and maximize functional independence and decrease caregiver burden. Pt continues to benefit from skilled PT services to progress toward functional mobility goals.     If plan is discharge home, recommend the following: A lot of help with walking and/or transfers;A lot of help with bathing/dressing/bathroom;Assistance with cooking/housework;Direct supervision/assist for medications management;Direct supervision/assist for financial management;Assist for transportation;Help with stairs or ramp for entrance   Can travel by private vehicle     No  Equipment Recommendations   (defer to post-acute setting)    Recommendations for Other Services       Precautions / Restrictions Precautions Precautions: Fall Restrictions Weight Bearing Restrictions: No     Mobility  Bed Mobility Overal bed mobility: Needs Assistance Bed Mobility: Supine to Sit     Supine to sit: Mod assist, HOB elevated, Used rails     General bed mobility comments: mod A to eleavte trunk and bring LEs to and off EOB    Transfers Overall transfer level: Needs  assistance Equipment used: Rolling walker (2 wheels), 1 person hand held assist Transfers: Sit to/from Stand Sit to Stand: Mod assist           General transfer comment: verbal/tactile cues to initiate trunk flexion, physical assist to power up into standing.    Ambulation/Gait Ambulation/Gait assistance: Min assist Gait Distance (Feet): 4 Feet Assistive device: Rolling walker (2 wheels) Gait Pattern/deviations: Shuffle Gait velocity: reduced     General Gait Details: short shuffling steps, reduced foot clearance bilaterally, unable to take steps backwards   Stairs             Wheelchair Mobility     Tilt Bed    Modified Rankin (Stroke Patients Only)       Balance Overall balance assessment: Needs assistance Sitting-balance support: No upper extremity supported, Feet supported Sitting balance-Leahy Scale: Poor Sitting balance - Comments: left lateral lean initially, progressing to an anterior lean with fatigue Postural control: Left lateral lean Standing balance support: Bilateral upper extremity supported, Reliant on assistive device for balance Standing balance-Leahy Scale: Poor Standing balance comment: CGA, tactile cues for posture                            Cognition Arousal: Alert Behavior During Therapy: WFL for tasks assessed/performed Overall Cognitive Status: History of cognitive impairments - at baseline Area of Impairment: Memory, Following commands, Safety/judgement, Problem solving                     Memory: Decreased short-term memory Following Commands: Follows one step commands with increased time Safety/Judgement: Decreased awareness of safety, Decreased  awareness of deficits   Problem Solving: Slow processing, Difficulty sequencing General Comments: hx of cog impiarment at baseline, pt typically oriented to self and familiar people only. per PCA, pt non verbal throughout session        Exercises General  Exercises - Lower Extremity Long Arc Quad: AROM, Right, Left, 10 reps, Seated Hip Flexion/Marching: AROM, Right, Left, 10 reps, Seated    General Comments General comments (skin integrity, edema, etc.): VSS, PCA present and providing information      Pertinent Vitals/Pain Pain Assessment Pain Assessment: No/denies pain Pain Intervention(s): Monitored during session    Home Living                          Prior Function            PT Goals (current goals can now be found in the care plan section) Acute Rehab PT Goals Patient Stated Goal: to improve strength and reduce falls risk PT Goal Formulation: With patient Time For Goal Achievement: 11/11/23 Progress towards PT goals: Progressing toward goals    Frequency    Min 1X/week      PT Plan      Co-evaluation              AM-PAC PT "6 Clicks" Mobility   Outcome Measure  Help needed turning from your back to your side while in a flat bed without using bedrails?: A Lot Help needed moving from lying on your back to sitting on the side of a flat bed without using bedrails?: A Lot Help needed moving to and from a bed to a chair (including a wheelchair)?: A Lot Help needed standing up from a chair using your arms (e.g., wheelchair or bedside chair)?: A Little Help needed to walk in hospital room?: A Lot Help needed climbing 3-5 steps with a railing? : Total 6 Click Score: 12    End of Session Equipment Utilized During Treatment: Gait belt Activity Tolerance: Patient tolerated treatment well Patient left: with call bell/phone within reach;in chair;with nursing/sitter in room Nurse Communication: Mobility status PT Visit Diagnosis: Other abnormalities of gait and mobility (R26.89);Muscle weakness (generalized) (M62.81)     Time: 1338-1400 PT Time Calculation (min) (ACUTE ONLY): 22 min  Charges:    $Therapeutic Activity: 8-22 mins PT General Charges $$ ACUTE PT VISIT: 1 Visit                      Jadelin Eng R. PTA Acute Rehabilitation Services Office: [phone]   Catalina Antigua 10/29/2023, 3:37 PM

## 2023-10-29 NOTE — Progress Notes (Signed)
  NEUROSURGERY PROGRESS NOTE   no issues overnight  EXAM:  BP (!) 153/68 (BP Location: Right Arm)   Pulse (!) 57   Temp 98.5 F (36.9 C) (Oral)   Resp 18   Ht 5\' 3"  (1.6 m)   Wt 78 kg   SpO2 94%   BMI 30.46 kg/m   awake, alert, answers simple questions, moves all extremities to command  IMPRESSION:  87 y.o. female  status post right middle meningeal artery embolization, at baseline  PLAN: - continue therapy services, snf placement likely.   Lisbeth Renshaw, MD [facility]

## 2023-10-29 NOTE — [place] FL2 (Addendum)
[place] MEDICAID FL2 LEVEL OF CARE FORM     IDENTIFICATION  Patient Name: Emily Escobar Birthdate: 13-Nov-1936 Sex: female Admission Date (Current Location): 10/22/2023  [facility] and [place] Number:  Producer, television/film/video and Address:  [facility] [place], [address] [place], [place] [address]      Provider Number: [id number]  Attending Physician Name and Address:  Dawley, Alan Mulder, DO  Relative Name and Phone Number:       Current Level of Care: Hospital Recommended Level of Care: Skilled Nursing Facility Prior Approval Number:    Date Approved/Denied:   PASRR Number: Manual Review  Discharge Plan: SNF    Current Diagnoses: Patient Active Problem List   Diagnosis Date Noted   Subdural hematoma (HCC) 10/22/2023   Rib fractures 05/28/2023   Parkinsonism (HCC) 09/23/2022   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   Cognitive impairment 04/02/2021   Gait abnormality 04/02/2021   Slurred speech 04/02/2021   Retinal pigment epithelium serous detachment, 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   Angina pectoris (HCC) 09/04/2020   Dyspnea on exertion 09/04/2020   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 choroidal neovascularization (HCC) 08/30/2020   Intermediate stage nonexudative age-related macular degeneration of both eyes 08/30/2020   Closed fracture of left femur, initial encounter (HCC) 07/17/2016   Essential hypertension 07/17/2016   Anxiety and depression 07/17/2016    Orientation RESPIRATION BLADDER Height & Weight     Self  Normal Incontinent Weight: 171 lb 15.3 oz (78 kg) Height:  5\' 3"  (160 cm)  BEHAVIORAL SYMPTOMS/MOOD NEUROLOGICAL BOWEL NUTRITION STATUS      Continent Diet (Regular)  AMBULATORY STATUS COMMUNICATION OF NEEDS Skin   Extensive Assist Verbally Surgical wounds (Puncture Right, Gauze)                       Personal Care Assistance Level of Assistance  Bathing, Feeding, Dressing Bathing Assistance:  Maximum assistance Feeding assistance: Limited assistance Dressing Assistance: Maximum assistance     Functional Limitations Info  Speech     Speech Info: Impaired    SPECIAL CARE FACTORS FREQUENCY  PT (By licensed PT), OT (By licensed OT)     PT Frequency: 5x/week OT Frequency: 5x/week            Contractures      Additional Factors Info  Code Status, Allergies Code Status Info: Full Allergies Info: Codeine, Fentanyl, Morphine, Morphine And Codeine           Current Medications (10/29/2023):  This is the current hospital active medication list Current Facility-Administered Medications  Medication Dose Route Frequency Provider Last Rate Last Admin   acetaminophen (TYLENOL) tablet 1,000 mg  1,000 mg Oral Q6H PRN Lisbeth Renshaw, MD   1,000 mg at 10/28/23 1620   atenolol (TENORMIN) tablet 50 mg  50 mg Oral Daily Lisbeth Renshaw, MD   50 mg at 10/29/23 0946   atorvastatin (LIPITOR) tablet 20 mg  20 mg Oral Daily Lisbeth Renshaw, MD   20 mg at 10/29/23 0946   citalopram (CELEXA) tablet 10 mg  10 mg Oral Daily Lisbeth Renshaw, MD   10 mg at 10/29/23 0947   dexamethasone (DECADRON) injection 4 mg  4 mg Intravenous TID Lisbeth Renshaw, MD   4 mg at 10/29/23 0947   divalproex (DEPAKOTE SPRINKLE) capsule 125 mg  125 mg Oral TID Lisbeth Renshaw, MD   125 mg at 10/29/23 0947   food thickener (SIMPLYTHICK (NECTAR/LEVEL 2/MILDLY THICK)) 10  packet  10 packet Oral PRN Lisbeth Renshaw, MD       hydrALAZINE (APRESOLINE) injection 5 mg  5 mg Intravenous Once Lisbeth Renshaw, MD       levETIRAcetam (KEPPRA) IVPB 500 mg/100 mL premix  500 mg Intravenous Q12H Lisbeth Renshaw, MD 400 mL/hr at 10/29/23 0953 500 mg at 10/29/23 0953   losartan (COZAAR) tablet 25 mg  25 mg Oral Daily Lisbeth Renshaw, MD   25 mg at 10/29/23 0947   methocarbamol (ROBAXIN) injection 500 mg  500 mg Intravenous Q6H PRN Lisbeth Renshaw, MD       mirtazapine (REMERON) tablet 15 mg  15  mg Oral QHS Lisbeth Renshaw, MD   15 mg at 10/28/23 2158   mupirocin ointment (BACTROBAN) 2 % 1 Application  1 Application Nasal BID Lisbeth Renshaw, MD   1 Application at 10/29/23 2536   Oral care mouth rinse  15 mL Mouth Rinse 4 times per day Dawley, Troy C, DO   15 mL at 10/29/23 0800   Oral care mouth rinse  15 mL Mouth Rinse PRN Dawley, Troy C, DO       pantoprazole (PROTONIX) EC tablet 80 mg  80 mg Oral Daily Lisbeth Renshaw, MD   80 mg at 10/29/23 6440     Discharge Medications: Please see discharge summary for a list of discharge medications.  Relevant Imaging Results:  Relevant Lab Results:   Additional Information [id number]  Kylar Leonhardt Felipa Emory, Student-Social Work

## 2023-10-29 NOTE — TOC Progression Note (Signed)
Transition of Care [facility]) - Progression Note    Patient Details  Name: Twan Maslow MRN: [id number] Date of Birth: 03/17/1936  [facility]) CM/SW Contact  Baldemar Lenis, [place] Phone Number: 10/29/2023, 3:44 PM  Clinical Narrative:   CSW noting therapy recommendations for SNF. CSW completed referral, faxed out to SNF in [place]. CSW attempted to contact [facility] earlier today, left a voicemail. CSW called [facility] again after no response, spoke with Marylene Land in Admissions and sent referral. Awaiting [facility] to review. CSW called daughter in law, Eunice Blase, to provide update. CSW to follow.    Expected Discharge Plan: Skilled Nursing Facility Barriers to Discharge: Continued Medical Work up, English as a second language teacher  Expected Discharge Plan and Services     Post Acute Care Choice: Skilled Nursing Facility Living arrangements for the past 2 months: Assisted Living Facility                                       Social Determinants of Health (SDOH) Interventions SDOH Screenings   Food Insecurity: No Food Insecurity (10/22/2023)  Housing: Low Risk  (10/22/2023)  Transportation Needs: No Transportation Needs (10/22/2023)  Utilities: Not At Risk (10/22/2023)  Social Connections: Unknown (04/29/2022)   Received from [facility], [facility]  Tobacco Use: Low Risk  (10/26/2023)    Readmission Risk Interventions     No data to display

## 2023-10-30 MED ORDER — LEVETIRACETAM 500 MG PO TABS
500.0000 mg | ORAL_TABLET | Freq: Two times a day (BID) | ORAL | Status: DC
  Administered 2023-10-30 – 2023-11-04 (×10): 500 mg via ORAL
  Filled 2023-10-30 (×10): qty 1

## 2023-10-30 NOTE — Progress Notes (Signed)
  NEUROSURGERY PROGRESS NOTE   no issues overnight  EXAM:  BP (!) 172/84 (BP Location: Right Leg)   Pulse (!) 59   Temp (!) 97.5 F (36.4 C) (Axillary)   Resp 16   Ht 5\' 3"  (1.6 m)   Wt 78 kg   SpO2 94%   BMI 30.46 kg/m   awake, alert,  answers simple questions.  Speech slowed moves all extremities to command  IMPRESSION:  87 y.o. female  status post right middle meningeal artery embolization, doing well. She is at baseline with dementia. Lives in assisted-living prior to admission, will likely need higher level of care upon discharge from inpatient setting.  PLAN: - continue therapy services - Awaiting SNF placement   Lisbeth Renshaw, MD [facility]

## 2023-10-30 NOTE — Plan of Care (Signed)

## 2023-10-30 NOTE — TOC Progression Note (Signed)
Transition of Care [facility]) - Progression Note    Patient Details  Name: Tondra Fileccia MRN: [id number] Date of Birth: 1936/07/30  Transition of Care [facility]) CM/SW Contact  Baldemar Lenis, [place] Phone Number: 10/30/2023, 3:35 PM  Clinical Narrative:   CSW updated by patient's daughter in law to ask about [place] [facility] for SNF. [facility] does not have any beds available. CSW contacted [employer] and sent referral for them to review. CSW spoke with Admissions and confirmed referral was received, will not have a response on referral until Monday. CSW contacted [facility] to check on status of referral, referral was received but not reviewed, won't have an answer until Monday. CSW spoke with Eunice Blase to provide update. CSW to follow.    Expected Discharge Plan: Skilled Nursing Facility Barriers to Discharge: Continued Medical Work up, English as a second language teacher  Expected Discharge Plan and Services     Post Acute Care Choice: Skilled Nursing Facility Living arrangements for the past 2 months: Assisted Living Facility                                       Social Determinants of Health (SDOH) Interventions SDOH Screenings   Food Insecurity: No Food Insecurity (10/22/2023)  Housing: Low Risk  (10/22/2023)  Transportation Needs: No Transportation Needs (10/22/2023)  Utilities: Not At Risk (10/22/2023)  Social Connections: Unknown (04/29/2022)   Received from [facility], [facility]  Tobacco Use: Low Risk  (10/26/2023)    Readmission Risk Interventions     No data to display

## 2023-10-30 NOTE — Progress Notes (Signed)
Occupational Therapy Treatment Patient Details Name: Emily Escobar MRN: [id number] DOB: May 19, 1936 Today's Date: 10/30/2023   History of present illness 87 y.o. female presents to [facility] hospital on 10/22/2023 after a fall. Imaging notable for R SDH with midline shift. Pt underwent R MMA embolization on 11/11. PMH includes anxiety, depression, HTN, macular degeneration, parkinsonism.   OT comments  Pt is making fair progress towards their acute OT goals. Overall pt required mod/max A fro bed mobility and mod A +2 for transfers and short ambulation with RW. Pt's bed spoiled upon arrival, total A for pericare and LB ADLs. Pt's PCA present and assisting, pt follows commands better when presented fom PCA. OT to continue to follow acutely to facilitate progress towards established goals. Pt will continue to benefit from skilled inpatient follow up therapy, <3 hours/day.       If plan is discharge home, recommend the following:  A lot of help with walking and/or transfers;A lot of help with bathing/dressing/bathroom;Assistance with cooking/housework;Direct supervision/assist for medications management;Direct supervision/assist for financial management;Assist for transportation;Help with stairs or ramp for entrance   Equipment Recommendations  None recommended by OT       Precautions / Restrictions Precautions Precautions: Fall Restrictions Weight Bearing Restrictions: No       Mobility Bed Mobility Overal bed mobility: Needs Assistance Bed Mobility: Sit to Supine, Supine to Sit     Supine to sit: Mod assist Sit to supine: Max assist        Transfers Overall transfer level: Needs assistance Equipment used: Rolling walker (2 wheels), 1 person hand held assist Transfers: Sit to/from Stand Sit to Stand: Mod assist           General transfer comment: mod +2 for stepping with turns, pt attempts to sit prematurly     Balance Overall balance assessment: Needs  assistance Sitting-balance support: No upper extremity supported, Feet supported Sitting balance-Leahy Scale: Poor     Standing balance support: Bilateral upper extremity supported, Reliant on assistive device for balance Standing balance-Leahy Scale: Poor                             ADL either performed or assessed with clinical judgement   ADL Overall ADL's : Needs assistance/impaired                         Toilet Transfer: Moderate assistance;+2 for safety/equipment;+2 for physical assistance;Ambulation;Rolling walker (2 wheels) Toilet Transfer Details (indicate cue type and reason): simulated, ~64ft to chair to assist with linen change Toileting- Clothing Manipulation and Hygiene: Total assistance Toileting - Clothing Manipulation Details (indicate cue type and reason): bed soiled upon arrival. total A for clothing and hygiene     Functional mobility during ADLs: Moderate assistance;Rolling walker (2 wheels) General ADL Comments: maximal cues needed, pt attempting to sit prematurally often. needs +2 when progressing away from bed    Extremity/Trunk Assessment Upper Extremity Assessment Upper Extremity Assessment: Generalized weakness   Lower Extremity Assessment Lower Extremity Assessment: Defer to PT evaluation        Vision   Vision Assessment?: No apparent visual deficits   Perception Perception Perception: Not tested   Praxis Praxis Praxis: Not tested    Cognition Arousal: Alert Behavior During Therapy: [facility] for tasks assessed/performed Overall Cognitive Status: History of cognitive impairments - at baseline  General Comments: followed some simple 1 step commands, follows cues better when coming from her PCA. pleasantly confused              General Comments VSS. PCA present.    Pertinent Vitals/ Pain       Pain Assessment Pain Assessment: No/denies pain Pain Intervention(s): Monitored  during session  Home Living Family/patient expects to be discharged to:: Assisted living                  Frequency  Min 1X/week        Progress Toward Goals  OT Goals(current goals can now be found in the care plan section)  Progress towards OT goals: Progressing toward goals  Acute Rehab OT Goals Patient Stated Goal: unable to state OT Goal Formulation: With patient Time For Goal Achievement: 11/11/23 Potential to Achieve Goals: Fair ADL Goals Pt Will Perform Grooming: with supervision;sitting Pt Will Perform Upper Body Dressing: with min assist;sitting Pt Will Transfer to Toilet: with min assist Pt Will Perform Toileting - Clothing Manipulation and hygiene: with mod assist;sitting/lateral leans Additional ADL Goal #1: pt will complete bed mobility with min A as a precursor to ADLs   AM-PAC OT "6 Clicks" Daily Activity     Outcome Measure   Help from another person eating meals?: A Little Help from another person taking care of personal grooming?: A Lot Help from another person toileting, which includes using toliet, bedpan, or urinal?: A Lot Help from another person bathing (including washing, rinsing, drying)?: A Lot Help from another person to put on and taking off regular upper body clothing?: A Lot Help from another person to put on and taking off regular lower body clothing?: A Lot 6 Click Score: 13    End of Session Equipment Utilized During Treatment: Gait belt;Rolling walker (2 wheels)  OT Visit Diagnosis: Unsteadiness on feet (R26.81);Other abnormalities of gait and mobility (R26.89);Muscle weakness (generalized) (M62.81)   Activity Tolerance Patient limited by fatigue   Patient Left in bed;with call bell/phone within reach;with bed alarm set;with family/visitor present   Nurse Communication Mobility status        Time: 9604-5409 OT Time Calculation (min): 24 min  Charges: OT General Charges $OT Visit: 1 Visit OT Treatments $Self  Care/Home Management : 8-22 mins $Therapeutic Activity: 8-22 mins  Derenda Mis, OTR/L Acute Rehabilitation Services Office [phone] Secure Chat Communication Preferred  Donia Pounds 10/30/2023, 3:13 PM

## 2023-10-31 NOTE — Progress Notes (Signed)
No new issues or problems.  Patient without significant headache.  Remains confused with a little bit restless.  She is afebrile.  Her vital signs are stable.  She is awake and aware.  She is confused.  Motor and sensory exams stable bilaterally.  Overall progressing reasonably well following right-sided middle meningeal artery embolization for treatment of her subacute subdural hematoma.  Continue supportive care and work toward discharge plan skilled nursing

## 2023-10-31 NOTE — Plan of Care (Signed)

## 2023-11-01 MED ORDER — DIPHENHYDRAMINE HCL 25 MG PO CAPS: 50.0000 mg | ORAL_CAPSULE | Freq: Four times a day (QID) | ORAL | Status: DC | PRN

## 2023-11-01 MED ORDER — HYDROXYZINE HCL 25 MG PO TABS
25.0000 mg | ORAL_TABLET | Freq: Three times a day (TID) | ORAL | Status: DC | PRN
  Administered 2023-11-02 – 2023-11-03 (×2): 25 mg via ORAL
  Filled 2023-11-01 (×2): qty 1

## 2023-11-01 MED ORDER — DEXAMETHASONE 2 MG PO TABS
2.0000 mg | ORAL_TABLET | Freq: Four times a day (QID) | ORAL | Status: DC
  Administered 2023-11-01 – 2023-11-04 (×12): 2 mg via ORAL
  Filled 2023-11-01 (×11): qty 1

## 2023-11-01 MED ORDER — POTASSIUM CHLORIDE CRYS ER 20 MEQ PO TBCR
40.0000 meq | EXTENDED_RELEASE_TABLET | Freq: Once | ORAL | Status: AC
  Administered 2023-11-01: 40 meq via ORAL
  Filled 2023-11-01: qty 2

## 2023-11-01 NOTE — Progress Notes (Signed)
No new issues or problems.  Patient remains confused and agitated.  She is afebrile.  She is awake.  She is alert.  She remains confused.  Motor examination symmetric bilaterally.  Status post MMA embolization for treatment of subacute subdural hematoma.  Continue supportive efforts.

## 2023-11-01 NOTE — Plan of Care (Signed)

## 2023-11-01 NOTE — Plan of Care (Signed)
  Problem: Activity: Goal: Risk for activity intolerance will decrease Outcome: Progressing   Problem: Nutrition: Goal: Adequate nutrition will be maintained Outcome: Progressing   Problem: Coping: Goal: Level of anxiety will decrease Outcome: Progressing   

## 2023-11-02 LAB — GLUCOSE, CAPILLARY
Glucose-Capillary: 124 mg/dL — ABNORMAL HIGH (ref 70–99)
Glucose-Capillary: 119 mg/dL — ABNORMAL HIGH (ref 70–99)

## 2023-11-02 MED ORDER — OMEPRAZOLE 20 MG PO TBDD
40.0000 mg | DELAYED_RELEASE_TABLET | Freq: Every day | ORAL | Status: DC
Start: 2023-11-02 — End: 2023-11-02
  Filled 2023-11-02: qty 2

## 2023-11-02 MED ORDER — PANTOPRAZOLE SODIUM 40 MG PO TBEC
40.0000 mg | DELAYED_RELEASE_TABLET | Freq: Every day | ORAL | Status: DC
  Administered 2023-11-03: 40 mg via ORAL
  Filled 2023-11-02: qty 1

## 2023-11-02 MED ORDER — PANTOPRAZOLE SODIUM 40 MG IV SOLR
40.0000 mg | Freq: Every day | INTRAVENOUS | Status: DC
  Filled 2023-11-02: qty 10

## 2023-11-02 NOTE — Progress Notes (Addendum)
Patient resting in bed.

## 2023-11-02 NOTE — Progress Notes (Signed)
Physical Therapy Treatment Patient Details Name: Emily Escobar MRN: [id number] DOB: 1936/08/12 Today's Date: 11/02/2023   History of Present Illness 87 y.o. female presents to [facility] hospital on 10/22/2023 after a fall. Imaging notable for R SDH with midline shift. Pt underwent R MMA embolization on 11/11. PMH includes anxiety, depression, HTN, macular degeneration, parkinsonism.    PT Comments  Pt greeted resting in bed and eager for OOB mobility. Pt pleasantly confused throughout session, needing consistent cues for attention to task, safety and sequencing. Pt requiring min A to come to sitting EOB, mod A for transfers and grossly min A for short gait in room with RW support. Pt needing max A to sit safely in chair as pt attempting to sit prematurely before in front of chair. Current plan remains appropriate to address deficits and maximize functional independence and decrease caregiver burden. Pt continues to benefit from skilled PT services to progress toward functional mobility goals.     If plan is discharge home, recommend the following: A lot of help with walking and/or transfers;A lot of help with bathing/dressing/bathroom;Assistance with cooking/housework;Direct supervision/assist for medications management;Direct supervision/assist for financial management;Assist for transportation;Help with stairs or ramp for entrance   Can travel by private vehicle     No  Equipment Recommendations   (defer to post-acute setting)    Recommendations for Other Services       Precautions / Restrictions Precautions Precautions: Fall Restrictions Weight Bearing Restrictions: No     Mobility  Bed Mobility Overal bed mobility: Needs Assistance Bed Mobility: Supine to Sit     Supine to sit: Mod assist     General bed mobility comments: mod A to eleavte trunk and bring LEs to and off EOB    Transfers Overall transfer level: Needs assistance Equipment used: Rolling walker (2 wheels), 1 person  hand held assist Transfers: Sit to/from Stand Sit to Stand: Mod assist           General transfer comment: mod A to rise to standing    Ambulation/Gait Ambulation/Gait assistance: Min assist Gait Distance (Feet): 10 Feet Assistive device: Rolling walker (2 wheels) Gait Pattern/deviations: Shuffle Gait velocity: reduced     General Gait Details: short shuffling steps, reduced foot clearance bilaterally,   Stairs             Wheelchair Mobility     Tilt Bed    Modified Rankin (Stroke Patients Only)       Balance Overall balance assessment: Needs assistance Sitting-balance support: No upper extremity supported, Feet supported Sitting balance-Leahy Scale: Poor Sitting balance - Comments: left lateral lean initially, progressing to an anterior lean with fatigue Postural control: Left lateral lean Standing balance support: Bilateral upper extremity supported, Reliant on assistive device for balance Standing balance-Leahy Scale: Poor Standing balance comment: CGA, tactile cues for posture                            Cognition Arousal: Alert Behavior During Therapy: WFL for tasks assessed/performed Overall Cognitive Status: History of cognitive impairments - at baseline Area of Impairment: Memory, Following commands, Safety/judgement, Problem solving                     Memory: Decreased short-term memory Following Commands: Follows one step commands with increased time Safety/Judgement: Decreased awareness of safety, Decreased awareness of deficits   Problem Solving: Slow processing, Difficulty sequencing General Comments: followed some simple 1 step commands, follows cues  better when coming from her PCA. pleasantly confused        Exercises      General Comments General comments (skin integrity, edema, etc.): VSS      Pertinent Vitals/Pain Pain Assessment Pain Assessment: No/denies pain    Home Living                           Prior Function            PT Goals (current goals can now be found in the care plan section) Acute Rehab PT Goals Patient Stated Goal: to improve strength and reduce falls risk PT Goal Formulation: With patient Time For Goal Achievement: 11/11/23 Potential to Achieve Goals: Fair Progress towards PT goals: Progressing toward goals    Frequency    Min 1X/week      PT Plan      Co-evaluation              AM-PAC PT "6 Clicks" Mobility   Outcome Measure  Help needed turning from your back to your side while in a flat bed without using bedrails?: A Lot Help needed moving from lying on your back to sitting on the side of a flat bed without using bedrails?: A Lot Help needed moving to and from a bed to a chair (including a wheelchair)?: A Lot Help needed standing up from a chair using your arms (e.g., wheelchair or bedside chair)?: A Little Help needed to walk in hospital room?: A Lot Help needed climbing 3-5 steps with a railing? : Total 6 Click Score: 12    End of Session Equipment Utilized During Treatment: Gait belt Activity Tolerance: Patient tolerated treatment well Patient left: with call bell/phone within reach;in chair;with chair alarm set;Other (comment) (with door open and RN aware of pt location) Nurse Communication: Mobility status PT Visit Diagnosis: Other abnormalities of gait and mobility (R26.89);Muscle weakness (generalized) (M62.81)     Time: 3086-5784 PT Time Calculation (min) (ACUTE ONLY): 24 min  Charges:    $Gait Training: 8-22 mins $Therapeutic Activity: 8-22 mins PT General Charges $$ ACUTE PT VISIT: 1 Visit                     Emily Escobar R. PTA Acute Rehabilitation Services Office: [phone]   Catalina Antigua 11/02/2023, 4:31 PM

## 2023-11-02 NOTE — Plan of Care (Signed)

## 2023-11-03 NOTE — TOC Progression Note (Signed)
Transition of Care [facility]) - Progression Note    Patient Details  Name: Ahmaya Hornyak MRN: [id number] Date of Birth: 07-Dec-1936  Transition of Care [facility]) CM/SW Contact  Baldemar Lenis, [place] Phone Number: 11/03/2023, 4:20 PM  Clinical Narrative:   CSW contacted [facility] and [facility], neither [place] are able to offer a bed for the patient. CSW updated daughter in law, Eunice Blase, and she asked for [facility] in [place]. CSW to contact them to check on availability.    Expected Discharge Plan: Skilled Nursing Facility Barriers to Discharge: Continued Medical Work up, English as a second language teacher  Expected Discharge Plan and Services     Post Acute Care Choice: Skilled Nursing Facility Living arrangements for the past 2 months: Assisted Living Facility                                       Social Determinants of Health (SDOH) Interventions SDOH Screenings   Food Insecurity: No Food Insecurity (10/22/2023)  Housing: Low Risk  (10/22/2023)  Transportation Needs: No Transportation Needs (10/22/2023)  Utilities: Not At Risk (10/22/2023)  Social Connections: Unknown (04/29/2022)   Received from [facility]  Tobacco Use: Low Risk  (10/26/2023)    Readmission Risk Interventions     No data to display

## 2023-11-03 NOTE — Plan of Care (Signed)
  Problem: Education: Goal: Knowledge of General Education information will improve Description: Including pain rating scale, medication(s)/side effects and non-pharmacologic comfort measures Outcome: Not Progressing Note: Pt disoriented.   Problem: Health Behavior/Discharge Planning: Goal: Ability to manage health-related needs will improve Outcome: Progressing   Problem: Clinical Measurements: Goal: Ability to maintain clinical measurements within normal limits will improve Outcome: Progressing Goal: Will remain free from infection Outcome: Progressing Goal: Diagnostic test results will improve Outcome: Progressing Goal: Respiratory complications will improve Outcome: Progressing Goal: Cardiovascular complication will be avoided Outcome: Progressing   Problem: Activity: Goal: Risk for activity intolerance will decrease Outcome: Progressing   Problem: Nutrition: Goal: Adequate nutrition will be maintained Outcome: Progressing   Problem: Coping: Goal: Level of anxiety will decrease Outcome: Progressing   Problem: Elimination: Goal: Will not experience complications related to bowel motility Outcome: Progressing Goal: Will not experience complications related to urinary retention Outcome: Progressing   Problem: Pain Management: Goal: General experience of comfort will improve Outcome: Progressing   Problem: Safety: Goal: Ability to remain free from injury will improve Outcome: Progressing   Problem: Skin Integrity: Goal: Risk for impaired skin integrity will decrease Outcome: Progressing   Problem: Education: Goal: Understanding of CV disease, CV risk reduction, and recovery process will improve Outcome: Progressing Goal: Individualized Educational Video(s) Outcome: Progressing   Problem: Activity: Goal: Ability to return to baseline activity level will improve Outcome: Progressing   Problem: Cardiovascular: Goal: Ability to achieve and maintain adequate  cardiovascular perfusion will improve Outcome: Progressing Goal: Vascular access site(s) Level 0-1 will be maintained Outcome: Progressing   Problem: Health Behavior/Discharge Planning: Goal: Ability to safely manage health-related needs after discharge will improve Outcome: Progressing

## 2023-11-03 NOTE — Progress Notes (Addendum)
    Providing Compassionate, Quality Care - Together   NEUROSURGERY PROGRESS NOTE     S: NAEs o/n.     O: EXAM:  BP (!) 161/112 (BP Location: Right Arm) Comment: RN notified; pt was moving  Pulse (!) 59   Temp (!) 97.5 F (36.4 C)   Resp 16   Ht 5\' 3"  (1.6 m)   Wt 78 kg   SpO2 100%   BMI 30.46 kg/m     Awake, alert. Confused, reportedly at baseline.  Speech fluent CNs grossly intact  PERRL MAEs, following commands    ASSESSMENT:  87 y.o. s/p MMA embo for R subacute SDH    PLAN: -Awaiting SNF placement -Call w/ questions/concerns.   Patrici Ranks, [facility]

## 2023-11-03 NOTE — TOC Progression Note (Signed)
Transition of Care [facility]) - Progression Note    Patient Details  Name: Emily Escobar MRN: [id number] Date of Birth: Dec 05, 1936  Transition of Care [facility]) CM/SW Contact  Emily Escobar, [place] Phone Number: 11/03/2023, 4:23 PM  Clinical Narrative:   CSW updated by Emily Escobar that family is also interested in [place]. CSW sent referral to [facility], and they are able to offer a bed for the patient. CSW contacted [facility] and they do not have any bed availability. CSW updated Emily Escobar, the family would like to choose [facility]. CSW requested CMA to initiate insurance authorization, awaiting response. Bed is available tomorrow pending approval. CSW to follow.    Expected Discharge Plan: Skilled Nursing Facility Barriers to Discharge: Continued Medical Work up, English as a second language teacher  Expected Discharge Plan and Services     Post Acute Care Choice: Skilled Nursing Facility Living arrangements for the past 2 months: Assisted Living Facility                                       Social Determinants of Health (SDOH) Interventions SDOH Screenings   Food Insecurity: No Food Insecurity (10/22/2023)  Housing: Low Risk  (10/22/2023)  Transportation Needs: No Transportation Needs (10/22/2023)  Utilities: Not At Risk (10/22/2023)  Social Connections: Unknown (04/29/2022)   Received from [facility], [facility]  Tobacco Use: Low Risk  (10/26/2023)    Readmission Risk Interventions     No data to display

## 2023-11-03 NOTE — Progress Notes (Signed)
Was unable to get pt's BP while awake d/t constant moving and stiffening. VS taken at 2349 on 11/02/2023 are most likely accurate because they were obtained while pt was asleep. BP obtained at 0404 is most likely inaccurate d/t pt constantly moving and stiffening.

## 2023-11-03 NOTE — Plan of Care (Signed)

## 2023-11-04 ENCOUNTER — Other Ambulatory Visit (HOSPITAL_COMMUNITY): Payer: Self-pay

## 2023-11-04 MED ORDER — LEVETIRACETAM 500 MG PO TABS
500.0000 mg | ORAL_TABLET | Freq: Two times a day (BID) | ORAL | 0 refills | Status: AC
  Filled 2023-11-04: qty 14, 7d supply, fill #0

## 2023-11-04 MED ORDER — DEXAMETHASONE 2 MG PO TABS
ORAL_TABLET | ORAL | 0 refills | Status: AC
  Filled 2023-11-04: qty 15, 8d supply, fill #0

## 2023-11-04 NOTE — Discharge Summary (Signed)
Patient ID: Emily Escobar MRN: [id number] DOB/AGE: [birth year] 87 y.o.  Admit date: 10/22/2023 Discharge date: 11/04/2023  Admission Diagnoses: Subdural hematoma (HCC) [S06.5XAA]   Discharge Diagnoses: Same   Discharged Condition: Stable  Hospital Course:  Emily Escobar is a 87 y.o. female who was admitted from the ED after an unwitnessed fall at ALF on 10/22/23. Workup revealed subacute R SDH. She received Decadron and underwent R MMA embolization without acute complication.  Hospital course was otherwise uncomplicated. Pt stable for discharge today to SNF with stable VS, tolerating normal diet. Pt to f/u in office for routine post op visit. Pt is in agreement w/ plan.    Discharge Exam: Blood pressure (!) 156/88, pulse (!) 57, temperature 98.3 F (36.8 C), temperature source Oral, resp. rate 18, height 5\' 3"  (1.6 m), weight 78 kg, SpO2 95%. Awake, alert. Confused, reportedly at baseline.  Speech fluent CNs grossly intact  PERRL MAEs, following commands    Disposition: Discharge disposition: 03-Skilled Nursing Facility       Discharge Instructions     Leave dressing on - Keep it clean, dry, and intact until clinic visit   Complete by: As directed       Allergies as of 11/04/2023       Reactions   Codeine Other (See Comments)   Pt states she has tolerated norco in the past Allergy not listed on MAR    Fentanyl Other (See Comments)   Hallucinations   Morphine Other (See Comments)   Patient's son states pt feels "looping" with morphine Allergy not listed on MAR    Morphine And Codeine Other (See Comments)   Patient's son states pt feels "looping" with morphine Allergy not listed on MAR         Medication List     TAKE these medications    acetaminophen 500 MG tablet Commonly known as: TYLENOL Take 1,000 mg by mouth in the morning, at noon, and at bedtime.   atenolol 50 MG tablet Commonly known as: TENORMIN Take 50 mg by mouth daily.   atorvastatin 20  MG tablet Commonly known as: LIPITOR Take 20 mg by mouth daily.   cholecalciferol 25 MCG (1000 UNIT) tablet Commonly known as: VITAMIN D3 Take 1,000 Units by mouth daily.   citalopram 10 MG tablet Commonly known as: CELEXA Take 15 mg by mouth daily.   dexamethasone 2 MG tablet Commonly known as: DECADRON Take 1 tablet (2 mg total) by mouth every 6 (six) hours for 2 days, THEN 1 tablet (2 mg total) every 12 (twelve) hours for 2 days, THEN 0.5 tablets (1 mg total) every 12 (twelve) hours for 2 days, THEN 0.5 tablets (1 mg total) daily for 2 days. Start taking on: November 04, 2023   divalproex 125 MG capsule Commonly known as: DEPAKOTE SPRINKLE Take 125 mg by mouth 3 (three) times daily.   fluticasone 50 MCG/ACT nasal spray Commonly known as: FLONASE Place 2 sprays into both nostrils daily.   guaiFENesin-dextromethorphan 100-10 MG/5ML syrup Commonly known as: ROBITUSSIN DM Take 10 mLs by mouth every 4 (four) hours as needed for cough (chest congestion).   levETIRAcetam 500 MG tablet Commonly known as: KEPPRA Take 1 tablet (500 mg total) by mouth 2 (two) times daily for 7 days.   lidocaine 5 % Commonly known as: LIDODERM Place 1 patch onto the skin daily. Remove & Discard patch within 12 hours or as directed by MD   losartan 25 MG tablet Commonly known as: COZAAR Take 25 mg  by mouth daily.   methocarbamol 500 MG tablet Commonly known as: ROBAXIN Take 1 tablet (500 mg total) by mouth every 8 (eight) hours as needed for muscle spasms (pain from rib fractues).   mirtazapine 15 MG tablet Commonly known as: REMERON Take 15 mg by mouth at bedtime.   multivitamin with minerals Tabs tablet Take 1 tablet by mouth daily.   omeprazole 40 MG capsule Commonly known as: PRILOSEC Take 40 mg by mouth daily.   PRESERVISION AREDS 2 PO Take 1 tablet by mouth in the morning and at bedtime.               Discharge Care Instructions  (From admission, onward)            Start     Ordered   11/04/23 0000  Leave dressing on - Keep it clean, dry, and intact until clinic visit        11/04/23 1107            Contact information for after-discharge care     Destination     [facility] .   Service: Skilled Nursing Contact information: [address] [place] [address] [phone]                     Signed: Clovis Riley 11/04/2023, 11:07 AM

## 2023-11-04 NOTE — Progress Notes (Signed)
    Providing Compassionate, Quality Care - Together   NEUROSURGERY PROGRESS NOTE     S: NAEs o/n.     O: EXAM:  BP (!) 156/88   Pulse (!) 57   Temp 98.3 F (36.8 C) (Oral)   Resp 18   Ht 5\' 3"  (1.6 m)   Wt 78 kg   SpO2 95%   BMI 30.46 kg/m     Awake, alert. Confused, reportedly at baseline.  Speech fluent CNs grossly intact  PERRL MAEs, following commands     ASSESSMENT:  87 y.o. s/p MMA embo for R subacute SDH     PLAN: -Awaiting SNF placement -Call w/ questions/concerns.   Emily Escobar, [facility]

## 2023-11-04 NOTE — TOC Transition Note (Signed)
Transition of Care [facility]) - CM/SW Discharge Note   Patient Details  Name: Emily Escobar MRN: [id number] Date of Birth: 1936/09/09  Transition of Care [facility]) CM/SW Contact:  Shanaye Rief Felipa [place], Student-Social Work Phone Number: 11/04/2023, 11:47 AM   Clinical Narrative:   MSW Student received insurance approval for patient to admit to [facility]. MSW student confirmed with MD that patient is stable for discharge. MSW Student notified Eunice Blase and they are in agreement with discharge. MSW Student confirmed bed is available at [place]. Transport arranged with [facility] for next available.   Number to call report: [phone] RM: [phone]    Final next level of care: Skilled Nursing Facility Barriers to Discharge: Barriers Resolved   Patient Goals and CMS Choice CMS Medicare.gov Compare Post Acute Care list provided to:: Patient Represenative (must comment) Choice offered to / list presented to : Adult Children  Discharge Placement                Patient chooses bed at: [facility] Patient to be transferred to facility by: [facility] Name of family member notified: Debbie Patient and family notified of of transfer: 11/04/23  Discharge Plan and Services Additional resources added to the After Visit Summary for       Post Acute Care Choice: Skilled Nursing Facility                               Social Determinants of Health (SDOH) Interventions SDOH Screenings   Food Insecurity: No Food Insecurity (10/22/2023)  Housing: Low Risk  (10/22/2023)  Transportation Needs: No Transportation Needs (10/22/2023)  Utilities: Not At Risk (10/22/2023)  Social Connections: Unknown (04/29/2022)   Received from [facility], [facility]  Tobacco Use: Low Risk  (10/26/2023)     Readmission Risk Interventions     No data to display
